# Patient Record
Sex: Female | Born: 1985 | Race: Black or African American | Hispanic: No | Marital: Single | Smoking: Current every day smoker
Health system: Southern US, Community
[De-identification: ages and names within clinical notes are randomized; demographics above are authoritative.]

## PROBLEM LIST (undated history)

## (undated) DIAGNOSIS — N946 Dysmenorrhea, unspecified: Principal | ICD-10-CM

## (undated) DIAGNOSIS — F419 Anxiety disorder, unspecified: Secondary | ICD-10-CM

## (undated) DIAGNOSIS — I10 Essential (primary) hypertension: Secondary | ICD-10-CM

## (undated) DIAGNOSIS — Q513 Bicornate uterus: Secondary | ICD-10-CM

## (undated) DIAGNOSIS — B379 Candidiasis, unspecified: Secondary | ICD-10-CM

## (undated) DIAGNOSIS — B9689 Other specified bacterial agents as the cause of diseases classified elsewhere: Secondary | ICD-10-CM

## (undated) DIAGNOSIS — F329 Major depressive disorder, single episode, unspecified: Secondary | ICD-10-CM

## (undated) DIAGNOSIS — N898 Other specified noninflammatory disorders of vagina: Principal | ICD-10-CM

## (undated) DIAGNOSIS — E669 Obesity, unspecified: Secondary | ICD-10-CM

## (undated) DIAGNOSIS — N926 Irregular menstruation, unspecified: Secondary | ICD-10-CM

## (undated) DIAGNOSIS — M79604 Pain in right leg: Secondary | ICD-10-CM

## (undated) DIAGNOSIS — K011 Impacted teeth: Secondary | ICD-10-CM

## (undated) DIAGNOSIS — F32A Depression, unspecified: Secondary | ICD-10-CM

## (undated) DIAGNOSIS — N390 Urinary tract infection, site not specified: Principal | ICD-10-CM

## (undated) DIAGNOSIS — N760 Acute vaginitis: Secondary | ICD-10-CM

## (undated) DIAGNOSIS — E119 Type 2 diabetes mellitus without complications: Secondary | ICD-10-CM

## (undated) DIAGNOSIS — K219 Gastro-esophageal reflux disease without esophagitis: Secondary | ICD-10-CM

## (undated) HISTORY — DX: Obesity, unspecified: E66.9

## (undated) HISTORY — DX: Irregular menstruation, unspecified: N92.6

## (undated) HISTORY — DX: Bicornate uterus: Q51.3

## (undated) HISTORY — DX: Other specified bacterial agents as the cause of diseases classified elsewhere: B96.89

## (undated) HISTORY — DX: Urinary tract infection, site not specified: N39.0

## (undated) HISTORY — DX: Other specified noninflammatory disorders of vagina: N89.8

## (undated) HISTORY — PX: NO PAST SURGERIES: SHX2092

## (undated) HISTORY — DX: Acute vaginitis: N76.0

## (undated) HISTORY — DX: Dysmenorrhea, unspecified: N94.6

## (undated) HISTORY — DX: Type 2 diabetes mellitus without complications: E11.9

## (undated) HISTORY — DX: Candidiasis, unspecified: B37.9

---

## 2008-05-29 ENCOUNTER — Encounter: Payer: Self-pay | Admitting: Family Medicine

## 2008-05-29 ENCOUNTER — Ambulatory Visit: Payer: Self-pay | Admitting: Family Medicine

## 2008-05-29 DIAGNOSIS — F17200 Nicotine dependence, unspecified, uncomplicated: Secondary | ICD-10-CM

## 2008-07-06 ENCOUNTER — Other Ambulatory Visit: Admission: RE | Admit: 2008-07-06 | Discharge: 2008-07-06 | Payer: Self-pay | Admitting: Family Medicine

## 2008-07-06 ENCOUNTER — Ambulatory Visit: Payer: Self-pay | Admitting: Family Medicine

## 2008-07-06 ENCOUNTER — Encounter: Payer: Self-pay | Admitting: Family Medicine

## 2008-07-06 DIAGNOSIS — R5383 Other fatigue: Secondary | ICD-10-CM

## 2008-07-06 DIAGNOSIS — R5381 Other malaise: Secondary | ICD-10-CM

## 2009-01-06 ENCOUNTER — Emergency Department (HOSPITAL_COMMUNITY): Admission: EM | Admit: 2009-01-06 | Discharge: 2009-01-06 | Payer: Self-pay | Admitting: Emergency Medicine

## 2009-09-16 ENCOUNTER — Ambulatory Visit: Payer: Self-pay | Admitting: Family Medicine

## 2009-09-16 ENCOUNTER — Encounter: Payer: Self-pay | Admitting: Physician Assistant

## 2009-09-16 DIAGNOSIS — E669 Obesity, unspecified: Secondary | ICD-10-CM | POA: Insufficient documentation

## 2009-09-16 DIAGNOSIS — K219 Gastro-esophageal reflux disease without esophagitis: Secondary | ICD-10-CM

## 2009-09-16 DIAGNOSIS — R110 Nausea: Secondary | ICD-10-CM | POA: Insufficient documentation

## 2009-09-16 LAB — CONVERTED CEMR LAB: Beta hcg, urine, semiquantitative: NEGATIVE

## 2009-09-30 ENCOUNTER — Telehealth: Payer: Self-pay | Admitting: Family Medicine

## 2009-10-01 ENCOUNTER — Ambulatory Visit: Payer: Self-pay | Admitting: Family Medicine

## 2009-10-01 DIAGNOSIS — R109 Unspecified abdominal pain: Secondary | ICD-10-CM

## 2009-10-01 LAB — CONVERTED CEMR LAB
Bilirubin Urine: NEGATIVE
Specific Gravity, Urine: <1.005
Urobilinogen, UA: 0.2
pH: 6

## 2009-10-04 ENCOUNTER — Encounter: Payer: Self-pay | Admitting: Physician Assistant

## 2009-10-04 LAB — CONVERTED CEMR LAB
Chlamydia, DNA Probe: NEGATIVE
GC Probe Amp, Genital: NEGATIVE

## 2009-11-02 ENCOUNTER — Other Ambulatory Visit
Admission: RE | Admit: 2009-11-02 | Discharge: 2009-11-02 | Payer: Self-pay | Source: Home / Self Care | Admitting: Family Medicine

## 2009-11-02 ENCOUNTER — Ambulatory Visit: Payer: Self-pay | Admitting: Family Medicine

## 2009-11-02 DIAGNOSIS — R1013 Epigastric pain: Secondary | ICD-10-CM

## 2009-11-02 DIAGNOSIS — K3189 Other diseases of stomach and duodenum: Secondary | ICD-10-CM

## 2009-11-02 DIAGNOSIS — N3000 Acute cystitis without hematuria: Secondary | ICD-10-CM | POA: Insufficient documentation

## 2009-11-02 LAB — CONVERTED CEMR LAB
Beta hcg, urine, semiquantitative: NEGATIVE
Bilirubin Urine: NEGATIVE
Glucose, Urine, Semiquant: NEGATIVE
Ketones, urine, test strip: NEGATIVE
Nitrite: NEGATIVE
Protein, U semiquant: NEGATIVE
Specific Gravity, Urine: 1.01
Urobilinogen, UA: 0.2
WBC Urine, dipstick: NEGATIVE
pH: 6

## 2009-11-04 ENCOUNTER — Telehealth: Payer: Self-pay | Admitting: Family Medicine

## 2009-11-05 LAB — CONVERTED CEMR LAB
Calcium: 9.4 mg/dL (ref 8.4–10.5)
Glucose, Bld: 87 mg/dL (ref 70–99)
Sodium: 139 meq/L (ref 135–145)
TSH: 1.501 microintl units/mL (ref 0.350–4.500)

## 2009-11-08 ENCOUNTER — Telehealth: Payer: Self-pay | Admitting: Family Medicine

## 2009-11-11 ENCOUNTER — Ambulatory Visit (HOSPITAL_COMMUNITY): Admission: RE | Admit: 2009-11-11 | Discharge: 2009-11-11 | Payer: Self-pay | Admitting: Family Medicine

## 2009-11-11 ENCOUNTER — Encounter: Payer: Self-pay | Admitting: Family Medicine

## 2009-11-15 ENCOUNTER — Encounter (HOSPITAL_COMMUNITY): Admission: RE | Admit: 2009-11-15 | Discharge: 2009-12-15 | Payer: Self-pay | Admitting: Family Medicine

## 2009-11-24 ENCOUNTER — Ambulatory Visit: Payer: Self-pay | Admitting: Family Medicine

## 2009-11-24 ENCOUNTER — Emergency Department (HOSPITAL_COMMUNITY): Admission: EM | Admit: 2009-11-24 | Discharge: 2009-11-24 | Payer: Self-pay | Admitting: Emergency Medicine

## 2009-11-24 DIAGNOSIS — R109 Unspecified abdominal pain: Secondary | ICD-10-CM | POA: Insufficient documentation

## 2009-11-25 ENCOUNTER — Encounter: Payer: Self-pay | Admitting: Family Medicine

## 2009-11-25 ENCOUNTER — Telehealth: Payer: Self-pay | Admitting: Family Medicine

## 2009-11-25 LAB — CONVERTED CEMR LAB: hCG, Beta Chain, Quant, S: 0.4 milliintl units/mL

## 2009-11-26 ENCOUNTER — Inpatient Hospital Stay (HOSPITAL_COMMUNITY): Admission: AD | Admit: 2009-11-26 | Discharge: 2009-11-26 | Payer: Self-pay | Admitting: Obstetrics & Gynecology

## 2009-11-26 ENCOUNTER — Telehealth: Payer: Self-pay | Admitting: Family Medicine

## 2009-11-26 LAB — CONVERTED CEMR LAB
Alkaline Phosphatase: 69 units/L (ref 39–117)
Basophils Absolute: 0 10*3/uL (ref 0.0–0.1)
Basophils Relative: 0 % (ref 0–1)
Bilirubin, Direct: 0.1 mg/dL (ref 0.0–0.3)
Eosinophils Absolute: 0.4 10*3/uL (ref 0.0–0.7)
Eosinophils Relative: 6 % — ABNORMAL HIGH (ref 0–5)
HCT: 39.8 % (ref 36.0–46.0)
Indirect Bilirubin: 0.2 mg/dL (ref 0.0–0.9)
LDL Cholesterol: 75 mg/dL (ref 0–99)
Lymphocytes Relative: 37 % (ref 12–46)
MCV: 94.1 fL (ref 78.0–100.0)
Platelets: 202 10*3/uL (ref 150–400)
RDW: 13.4 % (ref 11.5–15.5)
Total Bilirubin: 0.3 mg/dL (ref 0.3–1.2)
Vit D, 25-Hydroxy: <10 ng/mL — ABNORMAL LOW (ref 30–89)

## 2009-11-30 ENCOUNTER — Ambulatory Visit: Payer: Self-pay | Admitting: Internal Medicine

## 2009-11-30 DIAGNOSIS — R112 Nausea with vomiting, unspecified: Secondary | ICD-10-CM

## 2009-12-02 ENCOUNTER — Telehealth: Payer: Self-pay | Admitting: Gastroenterology

## 2010-04-11 ENCOUNTER — Emergency Department (HOSPITAL_COMMUNITY): Admission: EM | Admit: 2010-04-11 | Discharge: 2010-04-11 | Payer: Self-pay | Admitting: Emergency Medicine

## 2010-06-28 ENCOUNTER — Encounter: Payer: Self-pay | Admitting: Family Medicine

## 2010-08-14 ENCOUNTER — Encounter: Payer: Self-pay | Admitting: Family Medicine

## 2010-08-23 NOTE — Assessment & Plan Note (Signed)
Summary: abd pain/ss   Visit Type:  consult Referring Provider:  Syliva Overman Primary Care Provider:  Syliva Overman  Chief Complaint:  abd pain and n/v.  History of Present Illness: Pamela Ford is here for further evaluation of abd pain, n/v at request of Dr. Syliva Overman. Patient c/o 4 month h/o abd pain. She first says the pain is all over, but then points to lower abdomen as location of her pain. She c/o increased abd pain one week before and during her menstrual cycle. She is currently having her cycle and c/o heavy bleeding. Cycles occur once per month. She c/o lower abd cramping during these two weeks. She also has associated n/v at that time as well. The remaining time she generally feels well but will occasionally have vomiting and lower abd pain. Denies vaginal d/c or urinary symptoms. Denies heartburn, constipation, diarrhea, melena, brbpr. She admits to a lot of stress and her boyfriend says it seems to make her symptoms start. Denies weight loss.   She has had HIDA and Abd U/S which were unremarkable. Labs including, CBC, CMET, TSH, amylase, lipase, H. Pylori were unremarkable. She was found to have low vit D level. GC/chlamydia probes negative. Pap abnormal and need gyn referral but not done yet.  Current Medications (verified): 1)  Vitamin D (Ergocalciferol) 50000 Unit Caps (Ergocalciferol) .... One Tab Once A Week 2)  Promethazine Hcl 25 Mg Tabs (Promethazine Hcl) .... As Needed  Allergies (verified): No Known Drug Allergies  Past History:  Past Medical History: Reviewed history from 11/02/2009 and no changes required. obesity 4 month h/o vomtting and abdominal pain since Jan 2011  Past Surgical History: Reviewed history from 05/29/2008 and no changes required. none  Family History: mom healthy aged 57 dad   57, htn , dm brothers   health unkown ages 49 and 48 No FH of CRC, colon polyps, Crohns disease.  Social History: Single Occupation:  unemployed x 3  months Current Smoker  yes since 2008, 6 cigs Alcohol use-no Drug use-no  Review of Systems General:  Denies fever, chills, sweats, anorexia, fatigue, weakness, and weight loss. Eyes:  Denies vision loss. ENT:  Denies nasal congestion, sore throat, hoarseness, and difficulty swallowing. CV:  Denies chest pains, angina, palpitations, dyspnea on exertion, and peripheral edema. Resp:  Denies dyspnea at rest, dyspnea with exercise, cough, and wheezing. GI:  See HPI. GU:  Denies urinary burning, blood in urine, abnormal vaginal bleeding, and vaginal discharge. MS:  Denies joint pain / LOM. Derm:  Denies rash and itching. Neuro:  Denies weakness, frequent headaches, memory loss, and confusion. Psych:  Denies depression and anxiety. Endo:  Denies unusual weight change. Heme:  Denies bruising and bleeding. Allergy:  Denies hives and rash.  Vital Signs:  Patient profile:   25 year old female Menstrual status:  regular Height:      64 inches Weight:      208 pounds BMI:     35.83 Temp:     97.4 degrees F oral Pulse rate:   80 / minute BP sitting:   132 / 80  (left arm) Cuff size:   regular  Vitals Entered By: Hendricks Limes LPN (Nov 30, 2009 9:22 AM)  Physical Exam  General:  Well developed, well nourished, no acute distress.obese.   Head:  Normocephalic and atraumatic. Eyes:  Conjunctivae pink, no scleral icterus.  Mouth:  Oropharyngeal mucosa moist, pink.  No lesions, erythema or exudate.    Neck:  Supple; no masses or thyromegaly.  Lungs:  Clear throughout to auscultation. Heart:  Regular rate and rhythm; no murmurs, rubs,  or bruits. Abdomen:  Bowel sounds normal.  Abdomen is soft, nontender, nondistended.  No rebound or guarding.  No hepatosplenomegaly, masses or hernias.  No abdominal bruits.  Extremities:  No clubbing, cyanosis, edema or deformities noted. Neurologic:  Alert and  oriented x4;  grossly normal neurologically. Skin:  Intact without significant lesions or  rashes. Cervical Nodes:  No significant cervical adenopathy. Psych:  Alert and cooperative. Normal mood and affect.  Impression & Recommendations:  Problem # 1:  ABDOMINAL PAIN (ICD-789.00)  Lower abdominal pain, described as crampy, which precedes and is during menstrual cycle. She denies any association with bowel function. She has daily, normal BMs. ?gyn relation, ?endometriosis. She has h/o abnormal pap and patient states she has not been to gyn about this. Recommend gyn referral.  Orders: Consultation Level III ([address])  Problem # 2:  NAUSEA WITH VOMITING (ICD-787.01)  Seems to be mostly related to week before and of menstrual cycles. Some n/v at other times but not as much. Denies typical heartburn. ?dyspepsia or atypical reflux. ?hormone related gastroparesis. Initially I have asked that she keep diary of her symptoms. She will start Nexium 40mg  by mouth daily. I think current w/u is reassuring. She will f/u with Dr. Jena Gauss in 3-4 weeks. At that time, will reassess symptoms and decide about need for CT A/P or EGD.   Orders: Consultation Level III (60[address])  Patient Instructions: 1)  Please start Nexium 40mg  one capsule 30 mins before breakfast. 2)  Please keep record of your symptoms. Each day, write down if you have abdominal pain, vomiting, or a bowel movement.  3)  Come back to see [place] in four weeks to decide if further work-up needed. 4)  The medication list was reviewed and reconciled.  All changed / newly prescribed medications were explained.  A complete medication list was provided to the patient / caregiver. I would like to thank Dr. Lodema Hong for allowing [place] to take part in the care of this nice patient.

## 2010-08-23 NOTE — Progress Notes (Signed)
Summary: lab results   Phone Note Call from Patient   Summary of Call: pt would like to get results of lab work [phone] Initial call taken by: Rudene Anda,  Nov 25, 2009 2:05 PM  Follow-up for Phone Call        advised patient negative Follow-up by: Adella Hare LPN,  Nov 25, 1476 2:56 PM

## 2010-08-23 NOTE — Assessment & Plan Note (Signed)
Summary: per dr. Lodema Hong   Vital Signs:  Patient profile:   25 year old female Menstrual status:  regular LMP:     11/03/2009 Height:      64 inches Weight:      209.25 pounds BMI:     36.05 O2 Sat:      95 % on Room air Pulse rate:   94 / minute Pulse rhythm:   regular Resp:     16 per minute BP sitting:   126 / 80  (left arm) Cuff size:   large  Vitals Entered By: Everitt Amber LPN (Nov 25, 8839 4:10 PM)  Nutrition Counseling: [name] BMI is greater than 25 and therefore counseled on weight management options.  O2 Flow:  Room air CC: has been vomiting and having abdominal cramps for 3-4 months now. Pain Assessment Patient in pain? 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  History of Present Illness: c/o approx 3 to 4 monthh/olower abdominal apin as wellas nausea ansd vomitting. Pt was actually at an urgent care facility earlier today stating she had this complaint and that no one was l;istening. Sheis now being re-evaluated here. Since she was seen iin the office in the past month she had gall bladder eval which is negative, also has seen gynae for an abnormal pap smear.She had a neg urine test for infection and denies UtI sym[ptoms. She is accompanied by her partener who states that she experienced alot of abdominal pain during intercourse the previous bnight which nearly necessitated an EDvisit. Her lmp was abn, she has a neg urine preg test. she denies fever or chills.  She has an appt with gI scheduled forn  next week.  Current Medications (verified): 1)  Tri-Sprintec 0.18/0.215/0.25 Mg-35 Mcg Tabs (Norgestim-Eth Estrad Triphasic) .... Use As Directed  Allergies (verified): No Known Drug  Allergies  Review of Systems      See HPI General:  Complains of fatigue; denies chills and fever. ENT:  Denies hoarseness, nasal congestion, sinus pressure, and sore throat. CV:  Denies chest pain or discomfort, palpitations, and swelling of feet. Resp:  Denies cough and sputum productive. GI:  Complains of abdominal pain, nausea, and vomiting; denies change in bowel habits, constipation, and diarrhea. GU:  Denies dysuria and urinary frequency. Derm:  Denies itching and rash. Neuro:  Denies headaches and seizures. Psych:  Denies anxiety and depression. Endo:  Denies excessive thirst and excessive urination. Heme:  Denies abnormal bruising and bleeding. Allergy:  Denies hives or rash and itching eyes.  Physical Exam  General:  Well-developed,obese,in no acute distress; alert,appropriate and cooperative throughout examination HEENT: No facial asymmetry,  EOMI, No sinus tenderness, TM's Clear, oropharynx  pink and moist.   Chest: Clear to auscultation bilaterally.  CVS: S1, S2, No murmurs, No S3.   Abd: Soft, generalised tenderness, possible rebound in RLQ, questionable RLQ mass MS: Adequate ROM spine, hips, shoulders and knees.  Ext: No edema.   CNS: CN 2-12 intact, power tone and sensation normal throughout.   Skin: Intact, no visible lesions or rashes.  Psych: Good eye contact, normal affect.  Memory intact, not anxious or depressed appearing.    Impression & Recommendations:  Problem #  1:  ABDOMINAL PAIN (ICD-789.00) Assessment Deteriorated  Orders: T-Hepatic Function [phone]) Radiology Referral (Radiology)  Problem # 2:  DYSPEPSIA (ICD-536.8) Assessment: Deteriorated  Orders: T-Lipase ([id number]) T-Amylase ([id number])  Problem # 3:  NAUSEA (ICD-787.02) Assessment: Unchanged  Orders: T-Pregnancy (Serum), Quant. [phone])  Discussed symptom control.   Problem # 4:  OBESITY (ICD-278.00) Assessment: Unchanged  Ht: 64 (11/24/2009)   Wt: 209.25  (11/24/2009)   BMI: 36.05 (11/24/2009)  Problem # 5:  NICOTINE ADDICTION (ICD-305.1) Assessment: Unchanged  Encouraged smoking cessation and discussed different methods for smoking cessation.   Complete Medication List: 1)  Tri-sprintec 0.18/0.215/0.25 Mg-35 Mcg Tabs (Norgestim-eth estrad triphasic) .... Use as directed  Other Orders: T-CBC w/Diff [phone]) T-Lipid Profile [phone]) T-Vitamin D (25-Hydroxy) [phone])  Patient Instructions: 1)  Please schedule a follow-up appointment in 1 month. 2)  You are being referred for scans of your abdomen and pelvis to see whybyopu arr hviang severe abdominal pain fop the past 4 months with nausea and vomitting. 3)  I will also speak with gynae about the fact that you are  having alot of pain with intercourse , as well as excessively   painful cycles in the past 4 months

## 2010-08-23 NOTE — Progress Notes (Signed)
Summary: Cancelled CT  Phone Note Call from Patient   Summary of Call: pt going to cancel Ct scan today. Says she is going to go to [place]. I told pt that I would let the Doc know that she was cancelling appt. but never gave me a reason why. I do know that this pt doesn't have insurance! And told her to speak with Lubertha Basque about payment plans and also getting discount. But pt never did. Just wanted you to know she cancelled the CT.  Initial call taken by: Rudene Anda,  Nov 26, 2009 8:40 AM  Follow-up for Phone Call        noted Follow-up by: Syliva Overman MD,  Nov 26, 2009 11:16 AM

## 2010-08-23 NOTE — Progress Notes (Signed)
Summary: results   Phone Note Call from Patient   Summary of Call: would like to get results of lab work. [phone] Initial call taken by: Rudene Anda,  November 08, 2009 9:01 AM  Follow-up for Phone Call        returned call, left message Follow-up by: Adella Hare LPN,  November 08, 2009 9:12 AM  Additional Follow-up for Phone Call Additional follow up Details #1::        patient aware labs normal and also to return to lab to have the additional test they didnt do  patient is also aware of radiology appt  Additional Follow-up by: Adella Hare LPN,  November 08, 2009 9:17 AM

## 2010-08-23 NOTE — Letter (Signed)
Summary: [facility] referral for [facility] Scan  [facility] referral for [facility]   Imported By: Rudene Anda 11/11/2009 13:20:57  _____________________________________________________________________  External Attachment:    Type:   Image     Comment:   External Document

## 2010-08-23 NOTE — Assessment & Plan Note (Signed)
Summary: lower abdomen pain- room 2   Vital Signs:  Patient profile:   25 year old female Menstrual status:  regular Height:      64 inches Weight:      213.50 pounds BMI:     36.78 O2 Sat:      97 % on Room air Temp:     98.7 degrees F oral Pulse rate:   80 / minute Resp:     16 per minute BP sitting:   120 / 80  (left arm)  Vitals Entered By: Adella Hare LPN (October 01, 2009 10:06 AM)  Nutrition Counseling: [name] BMI is greater than 25 and therefore counseled on weight management options. CC: pain in lower abdomen Is Patient Diabetic? No Pain Assessment Patient in pain? yes     Location: abdomen Intensity: 5 Type: aching Onset of pain  Intermittent   CC:  pain in lower abdomen.  History of Present Illness: Pt is here today with c/o lower abd pains & cramping.  States she has pains x couple days before she starts her period. Also  has pain btwn menses that comes & goes, but "is more often than not".  She denies urinary frequency but states it does sometimes have some pain/discomfort with urination.  Pt is vague to how long this is going on,"long time".  She denies dysparunia.  BM's are nl.  No fever or chills.  Pt was seen a few wks ago due to GERD syptoms.  She states she is not having any heartburn now.  She did not pick up her prescription.  She said she is unable to afford it but didn't check at the pharmacy to see how much the cost is.    Pt has appt 11-02-09 for her annual physical/pap.  Allergies: No Known Drug Allergies  Past History:  Past medical history reviewed for relevance to current acute and chronic problems.  Past Medical History: Reviewed history from 05/29/2008 and no changes required. obesity  Review of Systems General:  Denies chills and fever. GI:  Complains of abdominal pain; denies bloody stools, change in bowel habits, constipation, dark tarry stools, diarrhea, indigestion, nausea, and vomiting. GU:  Denies abnormal vaginal bleeding,  urinary frequency, and urinary hesitancy.  Physical Exam  General:  Well-developed,well-nourished,in no acute distress; alert,appropriate and cooperative throughout examination Head:  Normocephalic and atraumatic without obvious abnormalities. No apparent alopecia or balding. Neck:  No deformities, masses, or tenderness noted. Lungs:  Normal respiratory effort, chest expands symmetrically. Lungs are clear to auscultation, no crackles or wheezes. Heart:  Normal rate and regular rhythm. S1 and S2 normal without gallop, murmur, click, rub or other extra sounds. Abdomen:  Nl BS x 4.  Pt reports tenderness with palp suprapubic.  No guarding rebound, or rigidity. Genitalia:  Normal introitus for age, no external lesions, no vaginal discharge, mucosa pink and moist, no vaginal or cervical lesions, no vaginal atrophy, no friaility or hemorrhage, normal uterus size and position. Neg CMT.  Mild tenderness with palp Lt adnexa > Rt without mass.  Cervical Nodes:  No lymphadenopathy noted Psych:  Cognition and judgment appear intact. Alert and cooperative with normal attention span and concentration. No apparent delusions, illusions, hallucinations   Impression & Recommendations:  Problem # 1:  PELVIC  PAIN (ICD-789.09) Assessment New  Orders: Radiology Referral (Radiology) T-Chlamydia & GC Probe, Genital ([id number])  Her updated medication list for this problem includes:    Naprosyn 500 Mg Tabs (Naproxen) .Marland Kitchen... Take 1 two times a  day with food as needed pelvic pain  Problem # 2:  GERD (ICD-530.81) Assessment: Improved Pt is not taking her Omeprazole.  States syptoms have improved.  Her updated medication list for this problem includes:    Omeprazole 20 Mg Cpdr (Omeprazole) .Marland Kitchen... 1 daily  Problem # 3:  OBESITY (ICD-278.00) Assessment: Improved Congratulated pt on her 2# wt loss. Recommend she continue with her walking for exercise & improved diet.  Complete Medication List: 1)   Omeprazole 20 Mg Cpdr (Omeprazole) .Marland Kitchen.. 1 daily 2)  Naprosyn 500 Mg Tabs (Naproxen) .... Take 1 two times a day with food as needed pelvic pain  Other Orders: UA Dipstick W/ Micro (manual) ([address])  Patient Instructions: 1)  Keep  your appt next month. 2)  I have ordered a pelvic ultrasound for you. 3)  I have prescribed Naprosyn for pain to the pharmacy.  Do not take any Aleve or Ibuprofen with this. 4)  It is important that you exercise regularly at least 20 minutes 5 times a week. If you develop chest pain, have severe difficulty breathing, or feel very tired , stop exercising immediately and seek medical attention. 5)  You need to lose weight. Consider a lower calorie diet and regular exercise.  Prescriptions: NAPROSYN 500 MG TABS (NAPROXEN) take 1 two times a day with food as needed pelvic pain  #30 x 0   Entered and Authorized by:   Esperanza Sheets PA   Signed by:   Esperanza Sheets PA on 10/01/2009   Method used:   Electronically to        [facility]  [address] (retail)       [address]       [place], [place]  [address]       Ph: [phone]       Fax: [phone]   RxID:   [id number]   Laboratory Results   Urine Tests  Date/Time Received: October 01, 2009  Date/Time Reported: October 01, 2009   Routine Urinalysis   Color: yellow Appearance: Clear Glucose: negative   (Normal Range: Negative) Bilirubin: negative   (Normal Range: Negative) Ketone: negative   (Normal Range: Negative) Spec. Gravity: <1.005   (Normal Range: 1.003-1.035) Blood: large   (Normal Range: Negative) pH: 6.0   (Normal Range: 5.0-8.0) Protein: negative   (Normal Range: Negative) Urobilinogen: 0.2   (Normal Range: 0-1) Nitrite: negative   (Normal Range: Negative) Leukocyte Esterace: negative   (Normal Range: Negative)

## 2010-08-23 NOTE — Letter (Signed)
Summary: 1st missed letter  1st missed letter   Imported By: Lind Guest 06/28/2010 17:05:52  _____________________________________________________________________  External Attachment:    Type:   Image     Comment:   External Document

## 2010-08-23 NOTE — Progress Notes (Signed)
Summary: results and wrong number  Phone Note Call from Patient   Summary of Call: pt gave wrong number for results. [phone]  please try her back Initial call taken by: Rudene Anda,  November 04, 2009 3:09 PM  Follow-up for Phone Call        this number is not a working number either Follow-up by: Adella Hare LPN,  November 04, 2009 4:37 PM

## 2010-08-23 NOTE — Progress Notes (Signed)
Summary: bladder infection  Phone Note Call from Patient   Summary of Call: pt wants to know how to treat for bladder infection. [phone] Initial call taken by: Rudene Anda,  September 30, 2009 11:16 AM  Follow-up for Phone Call        returned call, left message Follow-up by: Adella Hare LPN,  September 30, 2009 1:52 PM  Additional Follow-up for Phone Call Additional follow up Details #1::        complains of lower abdomen pressure, frequent urination Additional Follow-up by: Adella Hare LPN,  September 30, 2009 1:56 PM    Additional Follow-up for Phone Call Additional follow up Details #2::    patient coming in tomorrow to see dawn Follow-up by: Adella Hare LPN,  September 30, 2009 1:58 PM

## 2010-08-23 NOTE — Progress Notes (Signed)
Summary: gyn appt  ---- Converted from flag ---- ---- 12/02/2009 9:16 AM, Cloria Spring LPN wrote: Per Dr. Rayna Sexton office pt was seen there on 11/23/2009 and had a colpo????  ---- 11/30/2009 11:55 AM, Leanna Battles. Dixon Boos wrote: please make sure pt has made appt with dr. Rayna Sexton office for abnormal pap. was arranged by simpson, but pt did not seem to know about any of this today when I saw her ------------------------------  Good.

## 2010-08-23 NOTE — Assessment & Plan Note (Signed)
Summary: physical   Vital Signs:  Patient profile:   25 year old female Menstrual status:  regular LMP:     10/29/2009 Height:      64 inches Weight:      206.75 pounds BMI:     35.62 O2 Sat:      98 % Pulse rate:   77 / minute Pulse rhythm:   regular Resp:     16 per minute BP sitting:   120 / 80  (left arm)  Vitals Entered By: Everitt Amber LPN (November 02, 2009 2:27 PM)  Nutrition Counseling: [name] BMI is greater than 25 and therefore counseled on weight management options. CC: during her period she can't keep anything down, she vomits  Vision Screening:Left eye w/o correction: 20 / 20 Right Eye w/o correction: 20 / 20 Both eyes w/o correction:  20/ 20  Color vision testing: normal      Vision Entered By: Everitt Amber LPN (November 02, 2009 2:33 PM) LMP (date): 10/29/2009     Enter LMP: 10/29/2009 Last PAP Result NEGATIVE FOR INTRAEPITHELIAL LESIONS OR MALIGNANCY.   CC:  during her period she can't keep anything down and she vomits.  History of Present Illness: Reports  that she has not been doing very well. Her symptoms are confined to the GI system where she has been experiencinfg a significant amt of nausea, espescially at the time of her period, she also vomits this hass been going on for approx 4 months, she recently finished her cycle and is quite nauseated oday.Seh also reports fatigue, her father is diabetic, she has had no labwork and a urine test will be done to r/o pregnancy as wellas diabetes.She has not been on contraceptive meds for several months. Denies recent fever or chills. Denies sinus pressure, nasal congestion , ear pain or sore throat. Denies chest congestion, or cough productive of sputum. Denies chest pain, palpitations, PND, orthopnea or leg swelling. Denies  diarrhea or constipation. Denies change in bowel movements or bloody stool. Denies dysuria , frequency, incontinence or hesitancy. Denies  joint pain, swelling, or reduced  mobility. Denies headaches, vertigo, seizures. Denies depression, anxiety or insomnia. Denies  rash, lesions, or itch. She still smokes on avg 5 ciggs/day and is not highly motivated to quit at this time unfortunately.     Preventive Screening-Counseling & Management  Alcohol-Tobacco     Smoking Cessation Counseling: yes  Current Medications (verified): 1)  None  Allergies (verified): No Known Drug Allergies  Past History:  Past medical, surgical, family and social histories (including risk factors) reviewed for relevance to current acute and chronic problems.  Past Medical History: obesity 4 month h/o vomtting and abdominal pain since Jan 2011  Past Surgical History: Reviewed history from 05/29/2008 and no changes required. none  Family History: Reviewed history from 05/29/2008 and no changes required. mOm healthy aged 62 dad   39, htn , dm brothers   health unkown ages 54 and 17  Social History: Reviewed history from 05/29/2008 and no changes required. Single Occupation:  unemployed x 3 months Current Smoker  yes since 2008 Alcohol use-no Drug use-no  Review of Systems      See HPI General:  Complains of fatigue. GI:  Complains of dark tarry stools, excessive appetite, hemorrhoids, nausea, and vomiting; 4 month h/o nausea and vomitting intermittently, no diarreah, generalised abdominal pain, vomitting is mainly when she is on her cycle . Endo:  Complains of weight change. Heme:  Denies abnormal bruising and bleeding.  Allergy:  Denies hives or rash and itching eyes.  Physical Exam  General:  Well-developed,obese no acute distress; alert,appropriate and cooperative throughout examination Head:  Normocephalic and atraumatic without obvious abnormalities. No apparent alopecia or balding. Eyes:  No corneal or conjunctival inflammation noted. EOMI. Perrla. Funduscopic exam benign, without hemorrhages, exudates or papilledema. Vision grossly normal. Ears:   External ear exam shows no significant lesions or deformities.  Otoscopic examination reveals clear canals, tympanic membranes are intact bilaterally without bulging, retraction, inflammation or discharge. Hearing is grossly normal bilaterally. Nose:  External nasal examination shows no deformity or inflammation. Nasal mucosa are pink and moist without lesions or exudates. Mouth:  pharynx pink and moist and fair dentition.   Neck:  No deformities, masses, or tenderness noted. Chest Wall:  No deformities, masses, or tenderness noted. Breasts:  No mass, nodules, thickening, tenderness, bulging, retraction, inflamation, nipple discharge or skin changes noted.   Lungs:  Normal respiratory effort, chest expands symmetrically. Lungs are clear to auscultation, no crackles or wheezes. Heart:  Normal rate and regular rhythm. S1 and S2 normal without gallop, murmur, click, rub or other extra sounds. Abdomen:  obese, epigastric and RUQ tenderness. No guarding or rebound. no organomegaly or masses, bowel sounds present and normal Genitalia:  Normal introitus for age, no external lesions, no vaginal discharge, mucosa pink and moist, no vaginal or cervical lesions, no vaginal atrophy, no friaility or hemorrhage, normal uterus size and position, no adnexal masses or tenderness Msk:  No deformity or scoliosis noted of thoracic or lumbar spine.   Pulses:  R and L carotid,radial,femoral,dorsalis pedis and posterior tibial pulses are full and equal bilaterally Extremities:  No clubbing, cyanosis, edema, or deformity noted with normal full range of motion of all joints.   Neurologic:  No cranial nerve deficits noted. Station and gait are normal. Plantar reflexes are down-going bilaterally. DTRs are symmetrical throughout. Sensory, motor and coordinative functions appear intact. Skin:  Intact without suspicious lesions or rashes Cervical Nodes:  No lymphadenopathy noted Axillary Nodes:  No palpable  lymphadenopathy Inguinal Nodes:  No significant adenopathy Psych:  Cognition and judgment appear intact. Alert and cooperative with normal attention span and concentration. No apparent delusions, illusions, hallucinations   Impression & Recommendations:  Problem # 1:  SCREENING FOR MALIGNANT NEOPLASM OF THE CERVIX (ICD-V76.2) Assessment Comment Only  Orders: Pap Smear (93818)  Problem # 2:  ACUTE CYSTITIS (ICD-595.0) Assessment: Comment Only  Orders: UA Dipstick W/ Micro (manual) (81000),  negative, also no glucosuria, which is reassuring  Problem # 3:  PELVIC  PAIN (ICD-789.09) Assessment: Improved  The following medications were removed from the medication list:    Naprosyn 500 Mg Tabs (Naproxen) .Marland Kitchen... Take 1 two times a day with food as needed pelvic pain  Discussed use of medications, application of heat or cold, and exercises.   Problem # 4:  NAUSEA (ICD-787.02) Assessment: Deteriorated  Orders: Urine Pregnancy Test  (29937) negative Radiology Referral (Radiology)  Problem # 5:  NICOTINE ADDICTION (ICD-305.1) Assessment: Unchanged  Encouraged smoking cessation and discussed different methods for smoking cessation.   Complete Medication List: 1)  Tri-sprintec 0.18/0.215/0.25 Mg-35 Mcg Tabs (Norgestim-eth estrad triphasic) .... Use as directed 2)  Promethazine Hcl 25 Mg Tabs (Promethazine hcl) .... Take 1 tablet by mouth two times a day as needed  Other Orders: T-Basic Metabolic Panel [id number]) T-CBC w/Diff [id number]) T-TSH [id number]) TLB-H. Pylori Abs(Helicobacter Pylori) ([id number])  Patient Instructions: 1)  Please schedule a follow-up appointment in 2 months. 2)  Tobacco is very bad for your health and your loved ones! You Should stop smoking!. 3)  Stop Smoking Tips: Choose a Quit date. Cut down before the Quit date. decide what you will do as a substitute when you feel the urge to smoke(gum,toothpick,exercise). 4)  It is important that you  exercise regularly at least 20 minutes 5 times a week. If you develop chest pain, have severe difficulty breathing, or feel very tired , stop exercising immediately and seek medical attention. 5)  You need to lose weight. Consider a lower calorie diet and regular exercise.  6)  BMP prior to visit, ICD-9: 7)  TSH prior to visit, ICD-9:   labs today, abd pain and vomitting 8)  CBC w/ Diff prior to visit, ICD-9: 9)  H ptlori 10)  you will be referred for gall bladder studies, if no cause is found for your vomityting, tyou will be referred to a stomach special;ist. 11)  Meds are sent in for vomitting to use as needed, also your birth control pills  Prescriptions: PROMETHAZINE HCL 25 MG TABS (PROMETHAZINE HCL) Take 1 tablet by mouth two times a day as needed  #30 x 0   Entered and Authorized by:   Syliva Overman MD   Signed by:   Syliva Overman MD on 11/02/2009   Method used:   Electronically to        [facility]  [address] (retail)       [address]       [place], [place]  [address]       Ph: [phone]       Fax: [phone]   RxID:   [id number] TRI-SPRINTEC 0.18/0.215/0.25 MG-35 MCG TABS (NORGESTIM-ETH ESTRAD TRIPHASIC) Use as directed  #30 x 11   Entered and Authorized by:   Syliva Overman MD   Signed by:   Syliva Overman MD on 11/02/2009   Method used:   Electronically to        [facility]  [address] (retail)       [address]       [place], [place]  [address]       Ph: [phone]       Fax: [phone]   RxID:   [id number]    Laboratory Results   Urine Tests    Routine Urinalysis   Color: yellow Appearance: Clear Glucose: negative   (Normal Range: Negative) Bilirubin: negative   (Normal Range: Negative) Ketone: negative   (Normal Range: Negative) Spec. Gravity: 1.010   (Normal Range: 1.003-1.035) Blood: trace-lysed   (Normal Range: Negative) pH: 6.0   (Normal Range: 5.0-8.0) Protein: negative   (Normal Range:  Negative) Urobilinogen: 0.2   (Normal Range: 0-1) Nitrite: negative   (Normal Range: Negative) Leukocyte Esterace: negative   (Normal Range: Negative)    Urine HCG: negative

## 2010-08-23 NOTE — Progress Notes (Signed)
Summary: results  Phone Note Call from Patient   Summary of Call: pt would like to get results of lab work. [phone] Initial call taken by: Rudene Anda,  November 04, 2009 1:51 PM  Follow-up for Phone Call        this number is no longer in service Follow-up by: Adella Hare LPN,  November 04, 2009 3:00 PM

## 2010-08-23 NOTE — Assessment & Plan Note (Signed)
Summary: stomach pain- room 2   Vital Signs:  Patient profile:   25 year old female Menstrual status:  regular LMP:     09/08/2009 Height:      64 inches Weight:      215.94 pounds BMI:     37.20 O2 Sat:      100 % on Room air Pulse rate:   95 / minute Resp:     16 per minute BP sitting:   140 / 80  (left arm)  Vitals Entered By: Adella Hare LPN (September 16, 2009 1:29 PM)  Nutrition Counseling: [name] BMI is greater than 25 and therefore counseled on weight management options. CC: stomach pain, nausea Is Patient Diabetic? No Pain Assessment Patient in pain? no      LMP (date): 09/08/2009     Menstrual Status regular Enter LMP: 09/08/2009 Last PAP Result NEGATIVE FOR INTRAEPITHELIAL LESIONS OR MALIGNANCY.   CC:  stomach pain and nausea.  History of Present Illness: Nauseated off & on x 2-3 mos.  Sometimes feels like fluid & stuff trying to come up, but no vomiting.  Appetite is nl.  No worsening of syptoms at hs.  Sometimes seems worse in the am's. BM's are nl.  Pt wonders if this is due to "sinuses"  but denies any sinus pressure,  nasal congestion or post nasal drainage.   Menses are regular.  Not on birth control.  Pt states she's not certain she wants to get pregnant but her boyfriend wants her to.  Sometimes uses condoms.  Current Medications (verified): 1)  None  Allergies (verified): No Known Drug Allergies  Past History:  Past medical, surgical, family and social histories (including risk factors) reviewed for relevance to current acute and chronic problems.  Past Medical History: Reviewed history from 05/29/2008 and no changes required. obesity  Past Surgical History: Reviewed history from 05/29/2008 and no changes required. none  Family History: Reviewed history from 05/29/2008 and no changes required. mOm healthy aged 64 dad   67, htn , dm brothers   health unkown ages 5 and 80  Social History: Reviewed history from 05/29/2008 and no  changes required. Single Occupation:  CNA class Current Smoker  yes Alcohol use-no Drug use-no  Review of Systems General:  Denies chills and fever. ENT:  Denies earache, nasal congestion, postnasal drainage, sinus pressure, and sore throat. CV:  Denies chest pain or discomfort and palpitations. Resp:  Denies cough and shortness of breath. GI:  Complains of indigestion and nausea; denies abdominal pain, bloody stools, change in bowel habits, constipation, dark tarry stools, diarrhea, and vomiting. GU:  Denies dysuria and urinary frequency. Heme:  Denies enlarge lymph nodes and fevers.  Physical Exam  General:  Well-developed,well-nourished,in no acute distress; alert,appropriate and cooperative throughout examination Head:  Normocephalic and atraumatic without obvious abnormalities. No apparent alopecia or balding. Ears:  External ear exam shows no significant lesions or deformities.  Otoscopic examination reveals clear canals, tympanic membranes are intact bilaterally without bulging, retraction, inflammation or discharge. Hearing is grossly normal bilaterally. Nose:  External nasal examination shows no deformity or inflammation. Nasal mucosa are pink and moist without lesions or exudates. Mouth:  Oral mucosa and oropharynx without lesions or exudates.  Teeth in good repair. Neck:  No deformities, masses, or tenderness noted. Lungs:  Normal respiratory effort, chest expands symmetrically. Lungs are clear to auscultation, no crackles or wheezes. Heart:  Normal rate and regular rhythm. S1 and S2 normal without gallop, murmur, click, rub or other  extra sounds. Abdomen:  Bowel sounds positive,abdomen soft and non-tender without masses, organomegaly or hernias noted. Cervical Nodes:  No lymphadenopathy noted Psych:  Cognition and judgment appear intact. Alert and cooperative with normal attention span and concentration. No apparent delusions, illusions, hallucinations   Impression &  Recommendations:  Problem # 1:  GERD (ICD-530.81) Assessment New  Her updated medication list for this problem includes:    Omeprazole 20 Mg Cpdr (Omeprazole) .Marland Kitchen... 1 daily  Problem # 2:  NAUSEA (ICD-787.02) Assessment: New  Probably due to #1 above.  Orders: Urine Pregnancy Test  (60454)  Problem # 3:  OBESITY (ICD-278.00)  Ht: 64 (09/16/2009)   Wt: 215.94 (09/16/2009)   BMI: 37.20 (09/16/2009)  Discussed the effects of obesity on GERD.  Encouraged healthy eating habits, exercise & wt loss.   Problem # 4:  NICOTINE ADDICTION (ICD-305.1)  Encouraged smoking cessation.   Complete Medication List: 1)  Omeprazole 20 Mg Cpdr (Omeprazole) .Marland Kitchen.. 1 daily  Patient Instructions: 1)  Please schedule a follow-up appointment in 1 month.  Pap/pelvic exam. 2)  Tobacco is very bad for your health and your loved ones! You Should stop smoking!. 3)  It is important that you exercise regularly at least 20 minutes 5 times a week. If you develop chest pain, have severe difficulty breathing, or feel very tired , stop exercising immediately and seek medical attention. 4)  You need to lose weight. Consider a lower calorie diet and regular exercise.  5)  Start Omeprazole once daily.  If there is no improvement in 2-4 weeks let us know.  Prescriptions: OMEPRAZOLE 20 MG CPDR (OMEPRAZOLE) 1 daily  #30 x 2   Entered and Authorized by:   Esperanza Sheets PA   Signed by:   Esperanza Sheets PA on 09/16/2009   Method used:   Electronically to        [facility]  [address] (retail)       [address]       [place], [place]  [address]       Ph: [phone]       Fax: [phone]   RxID:   [id number]   Laboratory Results   Urine Tests  Date/Time Received: Sep 16 2009  Date/Time Reported: Sep 16 2009    Urine HCG: negative

## 2010-10-06 LAB — POCT URINALYSIS DIPSTICK
Glucose, UA: NEGATIVE mg/dL
Ketones, ur: NEGATIVE mg/dL
Specific Gravity, Urine: 1.01 (ref 1.005–1.030)

## 2010-10-06 LAB — GC/CHLAMYDIA PROBE AMP, GENITAL: GC Probe Amp, Genital: NEGATIVE

## 2010-10-06 LAB — POCT PREGNANCY, URINE: Preg Test, Ur: NEGATIVE

## 2010-10-11 LAB — POCT URINALYSIS DIP (DEVICE)
Bilirubin Urine: NEGATIVE
Hgb urine dipstick: NEGATIVE
Ketones, ur: NEGATIVE mg/dL
Specific Gravity, Urine: <=1.005 (ref 1.005–1.030)
pH: 7 (ref 5.0–8.0)

## 2010-10-11 LAB — URINALYSIS, ROUTINE W REFLEX MICROSCOPIC
Bilirubin Urine: NEGATIVE
Hgb urine dipstick: NEGATIVE
Specific Gravity, Urine: <1.005 — ABNORMAL LOW (ref 1.005–1.030)
pH: 6.5 (ref 5.0–8.0)

## 2010-10-31 LAB — URINALYSIS, ROUTINE W REFLEX MICROSCOPIC
Ketones, ur: NEGATIVE mg/dL
Nitrite: NEGATIVE
Specific Gravity, Urine: <1.005 — ABNORMAL LOW (ref 1.005–1.030)
Urobilinogen, UA: 0.2 mg/dL (ref 0.0–1.0)
pH: 6 (ref 5.0–8.0)

## 2010-10-31 LAB — PREGNANCY, URINE: Preg Test, Ur: NEGATIVE

## 2011-02-06 ENCOUNTER — Telehealth: Payer: Self-pay | Admitting: Family Medicine

## 2011-02-06 NOTE — Telephone Encounter (Signed)
Patient advised 10/2009, requested something be sent to health dept, advised to have them send a signed release form stating what info they are needing

## 2011-05-03 ENCOUNTER — Telehealth: Payer: Self-pay | Admitting: Family Medicine

## 2011-05-04 NOTE — Telephone Encounter (Signed)
Patient has called back again today wants to be seen call back at [phone]
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
## 2011-05-04 NOTE — Telephone Encounter (Signed)
Patient aware.

## 2011-05-05 ENCOUNTER — Encounter: Payer: Self-pay | Admitting: *Deleted

## 2011-05-05 ENCOUNTER — Emergency Department (HOSPITAL_COMMUNITY)
Admission: EM | Admit: 2011-05-05 | Discharge: 2011-05-05 | Disposition: A | Discharge status: Home / Self Care | Payer: Self-pay | Attending: Emergency Medicine | Admitting: Emergency Medicine

## 2011-05-05 DIAGNOSIS — F17200 Nicotine dependence, unspecified, uncomplicated: Secondary | ICD-10-CM | POA: Insufficient documentation

## 2011-05-05 DIAGNOSIS — R599 Enlarged lymph nodes, unspecified: Secondary | ICD-10-CM | POA: Insufficient documentation

## 2011-05-05 DIAGNOSIS — L659 Nonscarring hair loss, unspecified: Secondary | ICD-10-CM | POA: Insufficient documentation

## 2011-05-05 DIAGNOSIS — H9209 Otalgia, unspecified ear: Secondary | ICD-10-CM | POA: Insufficient documentation

## 2011-05-05 MED ORDER — IBUPROFEN 600 MG PO TABS: 600.0000 mg | ORAL_TABLET | Freq: Four times a day (QID) | ORAL | Status: AC | PRN

## 2011-05-05 NOTE — ED Provider Notes (Deleted)
Medical screening examination/treatment/procedure(s) were conducted as a shared visit with non-physician practitioner(s) and myself.  I personally evaluated the patient during the encounter   Flint Melter, MD 05/05/11 1144  Flint Melter, MD 05/05/11 1145
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
## 2011-05-05 NOTE — ED Notes (Signed)
Family at bedside. Patient states she is okay at this time nothing is needed.

## 2011-05-25 NOTE — ED Provider Notes (Signed)
Medical screening examination/treatment/procedure(s) were performed by non-physician practitioner and as supervising physician I was immediately available for consultation/collaboration._  Flint Melter, MD 05/25/11 [phone]

## 2011-12-23 ENCOUNTER — Encounter (HOSPITAL_COMMUNITY): Payer: Self-pay | Admitting: Emergency Medicine

## 2011-12-23 ENCOUNTER — Emergency Department (HOSPITAL_COMMUNITY)
Admission: EM | Admit: 2011-12-23 | Discharge: 2011-12-23 | Disposition: A | Discharge status: Home / Self Care | Payer: Self-pay | Attending: Emergency Medicine | Admitting: Emergency Medicine

## 2011-12-23 DIAGNOSIS — F17200 Nicotine dependence, unspecified, uncomplicated: Secondary | ICD-10-CM | POA: Insufficient documentation

## 2011-12-23 DIAGNOSIS — N946 Dysmenorrhea, unspecified: Secondary | ICD-10-CM | POA: Insufficient documentation

## 2011-12-23 DIAGNOSIS — K219 Gastro-esophageal reflux disease without esophagitis: Secondary | ICD-10-CM | POA: Insufficient documentation

## 2011-12-23 MED ORDER — TRAMADOL HCL 50 MG PO TABS: 50.0000 mg | ORAL_TABLET | Freq: Four times a day (QID) | ORAL | Status: AC | PRN

## 2011-12-23 MED ORDER — FAMOTIDINE 20 MG PO TABS
20.0000 mg | ORAL_TABLET | Freq: Once | ORAL | Status: AC
  Administered 2011-12-23: 20 mg via ORAL
  Filled 2011-12-23: qty 1

## 2011-12-23 MED ORDER — FAMOTIDINE 20 MG PO TABS: 20.0000 mg | ORAL_TABLET | Freq: Two times a day (BID) | ORAL | Status: DC

## 2011-12-23 MED ORDER — TRAMADOL HCL 50 MG PO TABS
50.0000 mg | ORAL_TABLET | Freq: Once | ORAL | Status: AC
  Administered 2011-12-23: 50 mg via ORAL
  Filled 2011-12-23: qty 1

## 2011-12-23 MED ORDER — FAMOTIDINE IN NACL 20-0.9 MG/50ML-% IV SOLN
20.0000 mg | Freq: Once | INTRAVENOUS | Status: DC
  Filled 2011-12-23: qty 50

## 2011-12-23 NOTE — Discharge Instructions (Signed)
Diet for GERD or PUD Nutrition therapy can help ease the discomfort of gastroesophageal reflux disease (GERD) and peptic ulcer disease (PUD).  HOME CARE INSTRUCTIONS   Eat your meals slowly, in a relaxed setting.   Eat 5 to 6 small meals per day.   If a food causes distress, stop eating it for a period of time.  FOODS TO AVOID  Coffee, regular or decaffeinated.   Cola beverages, regular or low calorie.   Tea, regular or decaffeinated.   Pepper.   Cocoa.   High fat foods, including meats.   Butter, margarine, hydrogenated oil (trans fats).   Peppermint or spearmint (if you have GERD).   Fruits and vegetables if not tolerated.   Alcohol.   Nicotine (smoking or chewing). This is one of the most potent stimulants to acid production in the gastrointestinal tract.   Any food that seems to aggravate your condition.  If you have questions regarding your diet, ask your caregiver or a registered dietitian. TIPS  Lying flat may make symptoms worse. Keep the head of your bed raised 6 to 9 inches (15 to 23 cm) by using a foam wedge or blocks under the legs of the bed.   Do not lay down until 3 hours after eating a meal.   Daily physical activity may help reduce symptoms.  MAKE SURE YOU:   Understand these instructions.   Will watch your condition.   Will get help right away if you are not doing well or get worse.  Document Released: 07/10/2005 Document Revised: 06/29/2011 Document Reviewed: 05/26/2011 [facility] Patient Information 2012 [place], [place].Dysmenorrhea Menstrual pain is caused by the muscles of the uterus tightening (contracting) during a menstrual period. The muscles of the uterus contract due to the chemicals in the uterine lining. Primary dysmenorrhea is menstrual cramps that last a couple of days when you start having menstrual periods or soon after. This often begins after a teenager starts having her period. As a woman gets older or has a baby, the cramps will  usually lesson or disappear. Secondary dysmenorrhea begins later in life, lasts longer, and the pain may be stronger than primary dysmenorrhea. The pain may start before the period and last a few days after the period. This type of dysmenorrhea is usually caused by an underlying problem such as:  The tissue lining the uterus grows outside of the uterus in other areas of the body (endometriosis).   The endometrial tissue, which normally lines the uterus, is found in or grows into the muscular walls of the uterus (adenomyosis).   The pelvic blood vessels are engorged with blood just before the menstrual period (pelvic congestive syndrome).   Overgrowth of cells in the lining of the uterus or cervix (polyps of the uterus or cervix).   Falling down of the uterus (prolapse) because of loose or stretched ligaments.   Depression.   Bladder problems, infection, or inflammation.   Problems with the intestine, a tumor, or irritable bowel syndrome.   Cancer of the female organs or bladder.   A severely tipped uterus.   A very tight opening or closed cervix.   Noncancerous tumors of the uterus (fibroids).   Pelvic inflammatory disease (PID).   Pelvic scarring (adhesions) from a previous surgery.   Ovarian cyst.   An intrauterine device (IUD) used for birth control.  CAUSES  The cause of menstrual pain is often unknown. SYMPTOMS   Cramping or throbbing pain in your lower abdomen.   Sometimes, a woman  may also experience headaches.   Lower back pain.   Feeling sick to your stomach (nausea) or vomiting.   Diarrhea.   Sweating or dizziness.  DIAGNOSIS  A diagnosis is based on your history, symptoms, physical examination, diagnostic tests, or procedures. Diagnostic tests or procedures may include:  Blood tests.   An ultrasound.   An examination of the lining of the uterus (dilation and curettage, D&C).   An examination inside your abdomen or pelvis with a scope (laparoscopy).    X-rays.   CT Scan.   MRI.   An examination inside the bladder with a scope (cystoscopy).   An examination inside the intestine or stomach with a scope (colonoscopy, gastroscopy).  TREATMENT  Treatment depends on the cause of the dysmenorrhea. Treatment may include:  Pain medicine prescribed by your caregiver.   Birth control pills.   Hormone replacement therapy.   Nonsteroidal anti-inflammatory drugs (NSAIDs). These may help stop the production of prostaglandins.   An IUD with progesterone hormone in it.   Acupuncture.   Surgery to remove adhesions, endometriosis, ovarian cyst, or fibroids.   Removal of the uterus (hysterectomy).   Progesterone shots to stop the menstrual period.   Cutting the nerves on the sacrum that go to the female organs (presacral neurectomy).   Electric currant to the sacral nerves (sacral nerve stimulation).   Antidepressant medicine.   Psychiatric therapy, counseling, or group therapy.   Exercise and physical therapy.   Meditation and yoga therapy.  HOME CARE INSTRUCTIONS   Only take over-the-counter or prescription medicines for pain, discomfort, or fever as directed by your caregiver.   Place a heating pad or hot water bottle on your lower back or abdomen. Do not sleep with the heating pad.   Use aerobic exercises, walking, swimming, biking, and other exercises to help lessen the cramping.   Massage to the lower back or abdomen may help.   Stop smoking.   Avoid alcohol and caffeine.   Yoga, meditation, or acupuncture may help.  SEEK MEDICAL CARE IF:   The pain does not get better with medicine.   You have pain with sexual intercourse.  SEEK IMMEDIATE MEDICAL CARE IF:   Your pain increases and is not controlled with medicines.   You have a fever.   You develop nausea or vomiting with your period not controlled with medicine.   You have abnormal vaginal bleeding with your period.   You pass out.  MAKE SURE YOU:    Understand these instructions.   Will watch your condition.   Will get help right away if you are not doing well or get worse.  Document Released: 07/10/2005 Document Revised: 06/29/2011 Document Reviewed: 10/26/2008 [facility] Patient Information 2012 [place], [place].   RESOURCE GUIDE  Chronic Pain Problems: Contact [facility]  [phone] Patients need to be referred by their primary care doctor.  Insufficient Money for Medicine: Contact United Way:  call "211" or [facility] [phone].  No Primary Care Doctor: - Call [facility] Connect  [phone] - can help you locate a primary care doctor that  accepts your insurance, provides certain services, etc. - Physician Referral [phone]  Agencies that provide inexpensive medical care: - [facility]  [phone] - [facility]  [phone] - [facility]  [phone] - Women's Clinic  [phone] - Planned Parenthood  [phone] [facility]  [phone]  Medicaid-accepting [facility] Providers: [facility]- [address]  [phone], Mon-Fri 9am-7pm, Sat 9am-1pm - [facility]- [address] [place], [place]  [phone] - [facility] [place] [facility]- [address] [place]  [phone] [facility]- [address]  [phone] - Renaye Rakers- [address] [place], [address], [phone]  Only accepts [place] patients after they have their name  applied to their card  Self Pay (no insurance) in [facility]: - Sickle Cell Patients: Dr Willey Blade, [facility]  [address] [place], [phone] - [facility]- [address] [place]  [phone]       [facility] [place]- [address]       -     [facility]- see information above (Speak to [employer] if you do not have insurance)       -   [facility]- [address] [place], [phone]       -  [facility]- [address],  [phone]       -  [facility]- [address] [phone]       -  Dr Julio Sicks-  [address], [place], [phone]       -  [facility]- [address], [phone]       -  [facility]- [address] [phone], also [address] [phone]       -    [facility]- [address] [place], [phone], 1st & 3rd Saturday   every month, 10am-1pm  1) Find a Doctor and Pay Out of Pocket Although you won't have to find out who is covered by your insurance plan, it is a good idea to ask around and get recommendations. You will then need to call the office and see if the doctor you have chosen will accept you as a new patient and what types of options they offer for patients who are self-pay. Some doctors offer discounts or will set up payment plans for their patients who do not have insurance, but you will need to ask so you aren't surprised when you get to your appointment.  2) Contact Your Local Health Department Not all health departments have doctors that can see patients for sick visits, but many do, so it is worth a call to see if yours does. If you don't know where your local health department is, you can check in your phone book. The CDC also has a tool to help you locate your state's health department, and many state websites also have listings of all of their local health departments.  3) Find a Walk-in Clinic If your illness is not likely to be very severe or complicated, you may want to try a walk in clinic. These are popping up all over the country in pharmacies, drugstores, and shopping centers. They're usually staffed by nurse practitioners or physician assistants that have been trained to treat common illnesses and complaints. They're usually fairly quick and inexpensive. However, if you have serious medical issues or  chronic medical problems, these are probably not your best option  STD Testing - [facility] Department of [facility] [place], STD Clinic, [address] [place], phone [phone] or [phone].  Monday - Friday, call for an appointment. [facility] Department of [employer]  [place], STD Clinic, [address], [place], phone [phone] or [phone].  Monday - Friday, call for an appointment.  Abuse/Neglect: [facility] Child Abuse Hotline 8563909068 [facility] Child Abuse Hotline 667-724-1613 (After Hours)  Emergency Shelter:  [facility] 475-770-5731  Maternity Homes: - [facility] (986)380-4681 - [facility] [phone]  MRSA Hotline #:   463 309 9160  [facility]  [facility] [place]  United Way [facility] Dept. [address]                 [address]         [address] [place] [address]  Blondell Reveal Phone:  [phone]                                  Phone:  [phone]                   Phone:  [phone]  [facility], [phone] - [facility] - [phone]       -     [facility] in [place], [address],                                  [phone], [facility] Child Abuse Hotline (479)527-4903 or 406-646-3319 (After Hours)   Behavioral Health Services  Substance Abuse Resources: - [facility] and Drug Services  (660) [phone] - [facility] 709-270-1008 - [facility] [place] [phone] Floydene Flock [phone] - Residential & Outpatient Substance Abuse Program  208-695-8197  Psychological Services: [facility]  [phone] Services  [phone] - [facility], [phone] [place]. [address] [place], ACCESS LINE: 631-834-3482 or 417-119-6559, [employer]  Dental Assistance  If unable to pay or uninsured, contact:  [facility] or [facility]. to become qualified for the adult dental clinic.  Patients with Medicaid: [facility] [phone] [address], [phone] [address], [phone]  If unable to pay, or uninsured, contact [facility] 815-603-3547) or [facility] Department [phone] in [place], [phone] in [facility]) to become qualified for the adult dental clinic  Other Low-Cost Community Dental Services: - [facility]- [address] [place], [place], [place], [address], [phone], [address], 2nd and 4th Thursday of the month at 6:30am.  10 clients each day by appointment, can sometimes see walk-in patients if someone does not show for an appointment. [facility]- [address] [place], [place], [address], [phone] - [facility]- [address], [place], [place], [address], [phone] [facility] Health Department- [phone] [facility] Health Department- [phone] [facility] [phone]        Use the medications as  prescribed for your symptoms.  You may also use Mylanta or Maalox if needed for extra relief of your acid reflux.

## 2011-12-23 NOTE — ED Notes (Signed)
Pt c/o acid reflux and lower abd pain x 2 weeks.

## 2011-12-23 NOTE — ED Notes (Signed)
Patient with no complaints at this time. Respirations even and unlabored. Skin warm/dry. Discharge instructions reviewed with patient at this time. Patient given opportunity to voice concerns/ask questions. Patient discharged at this time and left Emergency Department with steady gait.   

## 2011-12-23 NOTE — ED Provider Notes (Signed)
Medical screening examination/treatment/procedure(s) were performed by non-physician practitioner and as supervising physician I was immediately available for consultation/collaboration.   Dayton Bailiff, MD 12/23/11 1108

## 2011-12-23 NOTE — ED Provider Notes (Signed)
History     CSN: [id number]  Arrival date & time 12/23/11  7253   First MD Initiated Contact with Patient 12/23/11 [phone]      Chief Complaint  Patient presents with  . Gastrophageal Reflux  . Abdominal Pain    (Consider location/radiation/quality/duration/timing/severity/associated sxs/prior treatment) HPI Comments: Pamela Ford presents with 2 concerns, the first being acid reflux which she experiences quite frequently, but has been worse for the past several weeks especially at night.  She woke early this morning with acid refluxing into the back of her throat.  She denies nausea or vomiting and also denies upper abdominal pain.  She does however have complaints of low were pelvic cramping which she associates with her menses.  She states that for the past year with her menses and just before her period she had increasing cramping pain which is not relieved with OTC Tylenol.  She is expecting her period this week as her LMP ended May 9.  She denies vaginal discharge and pregnancy.  She has tried no medications for her acid reflux, she has noted that certain foods tend to make it worse such as tomato-based foods like pizza.  She denies eating before bedtime.  She denies fevers or chills, no diarrhea or other change in bowel habits.  The history is provided by the patient.    Past Medical History  Diagnosis Date  . Ovarian cyst     History reviewed. No pertinent past surgical history.  History reviewed. No pertinent family history.  History  Substance Use Topics  . Smoking status: Current Everyday Smoker    Types: Cigarettes  . Smokeless tobacco: Not on file  . Alcohol Use: No    OB History    Grav Para Term Preterm Abortions TAB SAB Ect Mult Living                  Review of Systems  Constitutional: Negative for fever.  HENT: Negative for congestion, sore throat and neck pain.   Eyes: Negative.   Respiratory: Negative for chest tightness and shortness of breath.     Cardiovascular: Negative for chest pain.  Gastrointestinal: Negative for nausea, vomiting and abdominal pain.  Genitourinary: Positive for pelvic pain. Negative for vaginal discharge and difficulty urinating.  Musculoskeletal: Negative for joint swelling and arthralgias.  Skin: Negative.  Negative for rash and wound.  Neurological: Negative for dizziness, weakness, light-headedness, numbness and headaches.  Hematological: Negative.   Psychiatric/Behavioral: Negative.     Allergies  Review of patient's allergies indicates no known allergies.  Home Medications   Current Outpatient Rx  Name Route Sig Dispense Refill  . ACETAMINOPHEN 500 MG PO TABS Oral Take 500 mg by mouth every 6 (six) hours as needed. Menstrual cramping     . FAMOTIDINE 20 MG PO TABS Oral Take 1 tablet (20 mg total) by mouth 2 (two) times daily. 30 tablet 0  . TRAMADOL HCL 50 MG PO TABS Oral Take 1 tablet (50 mg total) by mouth every 6 (six) hours as needed for pain. 15 tablet 0    BP 133/78  Pulse 89  Temp(Src) 97.7 F (36.5 C) (Oral)  Resp 17  Ht 5\' 4"  (1.626 m)  Wt 208 lb (94.348 kg)  BMI 35.70 kg/m2  SpO2 99%  LMP 11/30/2011  Physical Exam  Nursing note and vitals reviewed. Constitutional: She appears well-developed and well-nourished.  HENT:  Head: Normocephalic and atraumatic.  Eyes: Conjunctivae are normal.  Neck: Normal range of motion.  Cardiovascular: Normal rate, regular rhythm, normal heart sounds and intact distal pulses.   Pulmonary/Chest: Effort normal and breath sounds normal. She has no wheezes.  Abdominal: Soft. Bowel sounds are normal. She exhibits no distension. There is no tenderness. There is no rebound.  Musculoskeletal: Normal range of motion.  Neurological: She is alert.  Skin: Skin is warm and dry.  Psychiatric: She has a normal mood and affect.    ED Course  Procedures (including critical care time)   Labs Reviewed  POCT PREGNANCY, URINE   No results found.   1.  Acid reflux   2. Dysmenorrhea       MDM  Patient with classic acid reflux symptoms has not tried any acid reducers.  Exam is benign today with no abdominal pain distention or abnormal findings.  Intermittent menstrual cramping.  Labs reviewed today, the patient is not pregnant.  She will be prescribed Ultram for cramping pain relief as NSAIDs will possibly increased acid reflux symptoms.  She was also given a prescription for Pepcid and encouraged to add Maalox or Mylanta if needed for additional acid reflux relief.  She'll be referred to Dr. Fransico Him to establish primary medical care,  Also given resource guide for obtaining primary care.        Burgess Amor, PA 12/23/11 0956  Burgess Amor, PA 12/23/11 [phone]

## 2011-12-23 NOTE — ED Notes (Signed)
Julie Idol, PA at bedside 

## 2012-02-19 ENCOUNTER — Emergency Department (HOSPITAL_COMMUNITY)
Admission: EM | Admit: 2012-02-19 | Discharge: 2012-02-19 | Disposition: A | Discharge status: Home / Self Care | Payer: Medicaid Other | Attending: Emergency Medicine | Admitting: Emergency Medicine

## 2012-02-19 ENCOUNTER — Encounter (HOSPITAL_COMMUNITY): Payer: Self-pay | Admitting: *Deleted

## 2012-02-19 ENCOUNTER — Emergency Department (HOSPITAL_COMMUNITY): Payer: Medicaid Other

## 2012-02-19 ENCOUNTER — Other Ambulatory Visit (HOSPITAL_COMMUNITY): Payer: Self-pay

## 2012-02-19 DIAGNOSIS — Z331 Pregnant state, incidental: Secondary | ICD-10-CM | POA: Insufficient documentation

## 2012-02-19 DIAGNOSIS — O26899 Other specified pregnancy related conditions, unspecified trimester: Secondary | ICD-10-CM

## 2012-02-19 DIAGNOSIS — Z3490 Encounter for supervision of normal pregnancy, unspecified, unspecified trimester: Secondary | ICD-10-CM

## 2012-02-19 DIAGNOSIS — N949 Unspecified condition associated with female genital organs and menstrual cycle: Secondary | ICD-10-CM | POA: Insufficient documentation

## 2012-02-19 DIAGNOSIS — R10819 Abdominal tenderness, unspecified site: Secondary | ICD-10-CM | POA: Insufficient documentation

## 2012-02-19 LAB — URINALYSIS, ROUTINE W REFLEX MICROSCOPIC
Bilirubin Urine: NEGATIVE
Glucose, UA: NEGATIVE mg/dL
Ketones, ur: NEGATIVE mg/dL
Leukocytes, UA: NEGATIVE
Protein, ur: NEGATIVE mg/dL

## 2012-02-19 LAB — BASIC METABOLIC PANEL
BUN: 4 mg/dL — ABNORMAL LOW (ref 6–23)
Calcium: 9.8 mg/dL (ref 8.4–10.5)
Creatinine, Ser: 0.82 mg/dL (ref 0.50–1.10)
GFR calc Af Amer: >90 mL/min (ref >90)
GFR calc non Af Amer: >90 mL/min (ref >90)
Glucose, Bld: 110 mg/dL — ABNORMAL HIGH (ref 70–99)

## 2012-02-19 LAB — CBC WITH DIFFERENTIAL/PLATELET
Basophils Relative: 0 % (ref 0–1)
Eosinophils Absolute: 0.2 10*3/uL (ref 0.0–0.7)
Eosinophils Relative: 4 % (ref 0–5)
HCT: 32.5 % — ABNORMAL LOW (ref 36.0–46.0)
Hemoglobin: 11.4 g/dL — ABNORMAL LOW (ref 12.0–15.0)
Lymphs Abs: 2 10*3/uL (ref 0.7–4.0)
MCH: 32 pg (ref 26.0–34.0)
MCHC: 35.1 g/dL (ref 30.0–36.0)
MCV: 91.3 fL (ref 78.0–100.0)
Monocytes Absolute: 0.3 10*3/uL (ref 0.1–1.0)
Neutro Abs: 2.7 10*3/uL (ref 1.7–7.7)
Neutrophils Relative %: 52 % (ref 43–77)

## 2012-02-19 LAB — ABO/RH: ABO/RH(D): B POS

## 2012-02-19 MED ORDER — PRENATAL COMPLETE 14-0.4 MG PO TABS: 1.0000 | ORAL_TABLET | Freq: Every day | ORAL | Status: DC

## 2012-02-19 NOTE — ED Provider Notes (Addendum)
History     CSN: [id number]  Arrival date & time 02/19/12  1344   First MD Initiated Contact with Patient 02/19/12 1426      Chief Complaint  Patient presents with  . Abdominal Pain    (Consider location/radiation/quality/duration/timing/severity/associated sxs/prior treatment) Patient is a 26 y.o. female presenting with abdominal pain. The history is provided by the patient.  Abdominal Pain The primary symptoms of the illness include abdominal pain and vaginal bleeding. The primary symptoms of the illness do not include shortness of breath, nausea, vomiting or diarrhea.  Symptoms associated with the illness do not include back pain.   patient presented with lower abdominal pain for last week. It is dull and constant. Is worse with movement. She states she's had some vaginal spotting. No other vaginal discharge. No dysuria. No nausea vomiting or diarrhea. No constipation. She states she missed her period for July. Her last menstrual period was June 6.  Past Medical History  Diagnosis Date  . Ovarian cyst     History reviewed. No pertinent past surgical history.  No family history on file.  History  Substance Use Topics  . Smoking status: Current Everyday Smoker    Types: Cigarettes  . Smokeless tobacco: Not on file  . Alcohol Use: No    OB History    Grav Para Term Preterm Abortions TAB SAB Ect Mult Living                  Review of Systems  Constitutional: Negative for activity change and appetite change.  HENT: Negative for neck stiffness.   Eyes: Negative for pain.  Respiratory: Negative for chest tightness and shortness of breath.   Cardiovascular: Negative for chest pain and leg swelling.  Gastrointestinal: Positive for abdominal pain. Negative for nausea, vomiting and diarrhea.  Genitourinary: Positive for vaginal bleeding. Negative for flank pain.  Musculoskeletal: Negative for back pain.  Skin: Negative for rash.  Neurological: Negative for weakness,  numbness and headaches.  Psychiatric/Behavioral: Negative for behavioral problems.    Allergies  Review of patient's allergies indicates no known allergies.  Home Medications   Current Outpatient Rx  Name Route Sig Dispense Refill  . ACETAMINOPHEN 500 MG PO TABS Oral Take 500 mg by mouth every 6 (six) hours as needed. Menstrual cramping     . FAMOTIDINE 20 MG PO TABS Oral Take 1 tablet (20 mg total) by mouth 2 (two) times daily. 30 tablet 0    BP 125/64  Pulse 76  Temp 97.4 F (36.3 C) (Oral)  Resp 20  Ht 5\' 4"  (1.626 m)  Wt 208 lb (94.348 kg)  BMI 35.70 kg/m2  SpO2 100%  LMP 12/28/2011  Physical Exam  Nursing note and vitals reviewed. Constitutional: She is oriented to person, place, and time. She appears well-developed and well-nourished.  HENT:  Head: Normocephalic and atraumatic.  Eyes: EOM are normal. Pupils are equal, round, and reactive to light.  Neck: Normal range of motion. Neck supple.  Cardiovascular: Normal rate, regular rhythm and normal heart sounds.   No murmur heard. Pulmonary/Chest: Effort normal and breath sounds normal. No respiratory distress. She has no wheezes. She has no rales.  Abdominal: Soft. Bowel sounds are normal. She exhibits no distension. There is tenderness. There is no rebound and no guarding.       Suprapubic tenderness without rebound or guarding.  Musculoskeletal: Normal range of motion.  Neurological: She is alert and oriented to person, place, and time. No cranial nerve deficit.  Skin: Skin is warm and dry.  Psychiatric: She has a normal mood and affect. Her speech is normal.   pelvic exam showed some bloody discharge. Os appears closed, however cervix was quite posterior.  ED Course  Procedures (including critical care time)  Labs Reviewed  POCT PREGNANCY, URINE - Abnormal; Notable for the following:    Preg Test, Ur POSITIVE (*)     All other components within normal limits  URINALYSIS, ROUTINE W REFLEX MICROSCOPIC  CBC  WITH DIFFERENTIAL  BASIC METABOLIC PANEL  HCG, QUANTITATIVE, PREGNANCY  WET PREP, GENITAL  GC/CHLAMYDIA PROBE AMP, GENITAL   No results found.   No diagnosis found.    MDM  Patient with lower abdominal pain and positive pregnancy test here. Patient will get quantitative hCG and ultrasound to rule out ectopic. Care will be turned over to Dr. Preston Fleeting.        Juliet Rude. 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 She will be given prescriptions for prenatal vitamins and advised to obtain prenatal care.  Results for orders placed during the hospital encounter of 02/19/12  URINALYSIS, ROUTINE W REFLEX MICROSCOPIC      Component Value Range   Color, Urine YELLOW  YELLOW   APPearance CLEAR  CLEAR   Specific Gravity, Urine <1.005 (*) 1.005 - 1.030   pH 6.0  5.0 - 8.0   Glucose, UA NEGATIVE  NEGATIVE mg/dL   Hgb urine dipstick TRACE (*) NEGATIVE   Bilirubin Urine NEGATIVE  NEGATIVE   Ketones, ur NEGATIVE  NEGATIVE mg/dL   Protein, ur NEGATIVE  NEGATIVE mg/dL   Urobilinogen, UA 0.2  0.0 - 1.0 mg/dL   Nitrite NEGATIVE  NEGATIVE   Leukocytes, UA NEGATIVE  NEGATIVE  POCT PREGNANCY, URINE      Component Value Range   Preg Test, Ur POSITIVE (*) NEGATIVE  CBC WITH DIFFERENTIAL      Component Value Range   WBC 5.2  4.0 - 10.5 K/uL   RBC 3.56 (*) 3.87 - 5.11 MIL/uL   Hemoglobin 11.4 (*) 12.0 - 15.0 g/dL   HCT 40.9 (*) 81.1 - 91.4 %   MCV 91.3  78.0 - 100.0 fL   MCH 32.0  26.0 - 34.0 pg   MCHC 35.1  30.0 - 36.0 g/dL   RDW 78.2  95.6 - 21.3 %   Platelets    150 - 400 K/uL   Value: PLATELET CLUMPS NOTED ON SMEAR, COUNT APPEARS DECREASED   Neutrophils Relative 52  43 - 77 %   Lymphocytes Relative 39  12 - 46 %   Monocytes Relative 5  3 - 12 %   Eosinophils Relative 4  0 - 5 %   Basophils Relative 0  0 - 1 %   Neutro Abs 2.7  1.7 - 7.7 K/uL   Lymphs Abs 2.0  0.7 - 4.0 K/uL   Monocytes Absolute 0.3   0.1 - 1.0 K/uL   Eosinophils Absolute 0.2  0.0 - 0.7 K/uL   Basophils Absolute 0.0  0.0 - 0.1 K/uL  BASIC METABOLIC PANEL      Component Value Range   Sodium 139  135 - 145 mEq/L   Potassium 3.8  3.5 - 5.1 mEq/L   Chloride 104  96 - 112 mEq/L   CO2 25  19 - 32 mEq/L   Glucose, Bld 110 (*) 70 - 99 mg/dL   BUN 4 (*) 6 -  23 mg/dL   Creatinine, Ser 7.82  0.50 - 1.10 mg/dL   Calcium 9.8  8.4 - 95.6 mg/dL   GFR calc non Af Amer >90  >90 mL/min   GFR calc Af Amer >90  >90 mL/min  HCG, QUANTITATIVE, PREGNANCY      Component Value Range   hCG, Beta Chain, Quant, S 21308 (*) <5 mIU/mL  WET PREP, GENITAL      Component Value Range   Yeast Wet Prep HPF POC NONE SEEN  NONE SEEN   Trich, Wet Prep NONE SEEN  NONE SEEN   Clue Cells Wet Prep HPF POC NONE SEEN  NONE SEEN   WBC, Wet Prep HPF POC FEW (*) NONE SEEN  ABO/RH      Component Value Range   ABO/RH(D) B POS    URINE MICROSCOPIC-ADD ON      Component Value Range   Squamous Epithelial / LPF RARE  RARE   WBC, UA 0-2  <3 WBC/hpf   RBC / HPF 0-2  <3 RBC/hpf   Bacteria, UA RARE  RARE   US Ob Comp Less 14 Wks  02/19/2012  *RADIOLOGY REPORT*  Clinical Data: Missed menstrual period.  Positive urine pregnancy test.  7 weeks and 4 days pregnant by last menstrual period.  OBSTETRIC <14 WK [place] AND TRANSVAGINAL OB US  Technique:  Both transabdominal and transvaginal ultrasound examinations were performed for complete evaluation of the gestation as well as the maternal uterus, adnexal regions, and pelvic cul-de-sac.  Transvaginal technique was performed to assess early pregnancy.  Comparison:  Previous examinations.  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  Normal in size and shape. Yolk sac: Visualized, normal in size and shape. Embryo: Visualized. Cardiac Activity: Visualized. Heart Rate: 163 bpm  CRL: 13.2  mm  7 w  4 d             [place] EDC:  10/03/2012.  Maternal uterus/adnexae: Normal appearing ovaries.  Trace amount of free peritoneal fluid. Previously noted Mullerian duct anomaly.  No subchorionic hemorrhage seen.  IMPRESSION:  1.  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  Again, the exact uterine anomaly can be better determined with MRI of the pelvis. 2.  No complicating features.  Original Report Authenticated By: Darrol Angel, M.D.     Dione Booze, MD 02/19/12 [phone]

## 2012-02-19 NOTE — ED Notes (Signed)
Pt states lower abdominal  abdominal pain and missed period for July. LMP was June 6th. Spotting recently, pink in color.

## 2012-02-20 LAB — GC/CHLAMYDIA PROBE AMP, GENITAL
Chlamydia, DNA Probe: NEGATIVE
GC Probe Amp, Genital: NEGATIVE

## 2012-02-27 LAB — OB RESULTS CONSOLE RUBELLA ANTIBODY, IGM: Rubella: IMMUNE

## 2012-02-27 LAB — OB RESULTS CONSOLE HEPATITIS B SURFACE ANTIGEN: Hepatitis B Surface Ag: NEGATIVE

## 2012-03-05 ENCOUNTER — Encounter (HOSPITAL_COMMUNITY): Payer: Self-pay

## 2012-03-05 ENCOUNTER — Emergency Department (HOSPITAL_COMMUNITY)
Admission: EM | Admit: 2012-03-05 | Discharge: 2012-03-05 | Disposition: A | Discharge status: Home / Self Care | Payer: Medicaid Other | Attending: Emergency Medicine | Admitting: Emergency Medicine

## 2012-03-05 DIAGNOSIS — R12 Heartburn: Secondary | ICD-10-CM | POA: Insufficient documentation

## 2012-03-05 DIAGNOSIS — O99891 Other specified diseases and conditions complicating pregnancy: Secondary | ICD-10-CM | POA: Insufficient documentation

## 2012-03-05 DIAGNOSIS — K219 Gastro-esophageal reflux disease without esophagitis: Secondary | ICD-10-CM | POA: Insufficient documentation

## 2012-03-05 DIAGNOSIS — Z87891 Personal history of nicotine dependence: Secondary | ICD-10-CM | POA: Insufficient documentation

## 2012-03-05 DIAGNOSIS — O26899 Other specified pregnancy related conditions, unspecified trimester: Secondary | ICD-10-CM

## 2012-03-05 HISTORY — DX: Gastro-esophageal reflux disease without esophagitis: K21.9

## 2012-03-05 NOTE — ED Notes (Signed)
Pt seen and assessed by EDPa for primary assessment.

## 2012-03-05 NOTE — ED Notes (Signed)
Pt reports sinus drainage, pressure, and cough x 1 week.  Also reports throat feels sore.

## 2012-03-07 NOTE — ED Provider Notes (Signed)
History     CSN: [id number]  Arrival date & time 03/05/12  1652   First MD Initiated Contact with Patient 03/05/12 1728      Chief Complaint  Patient presents with  . Nasal Congestion    (Consider location/radiation/quality/duration/timing/severity/associated sxs/prior treatment) HPI Comments: Pamela Ford presents with nasal congestion with clear drainage along with sinus congestion, pressure and a nonproductive cough and sore throat.  She has a history of allergy and acid reflux disease as well,  And has noticed increased heartburn over the past several weeks.  She is currently pregnant in her first trimester, approximately 8 weeks.  She was taking pepcid for her acid reflux but stopped when she found out she was pregnant.  She denies fevers, chills, headache and has had no purulent or bloody nasal discharge.  She also denies shortness of breath, chest or abdominal pain.  She does have occasional nausea which is mild.  The history is provided by the patient.    Past Medical History  Diagnosis Date  . Ovarian cyst   . Acid reflux     History reviewed. No pertinent past surgical history.  No family history on file.  History  Substance Use Topics  . Smoking status: Former Smoker    Types: Cigarettes  . Smokeless tobacco: Not on file  . Alcohol Use: No    OB History    Grav Para Term Preterm Abortions TAB SAB Ect Mult Living                  Review of Systems  Constitutional: Negative for fever.  HENT: Positive for congestion, sore throat, rhinorrhea and sinus pressure. Negative for ear pain, nosebleeds, neck pain, neck stiffness and tinnitus.   Eyes: Negative.   Respiratory: Negative for chest tightness and shortness of breath.   Cardiovascular: Negative for chest pain.  Gastrointestinal: Positive for nausea. Negative for vomiting and abdominal pain.  Genitourinary: Negative.   Musculoskeletal: Negative for joint swelling and arthralgias.  Skin: Negative.   Negative for rash and wound.  Neurological: Negative for dizziness, weakness, light-headedness, numbness and headaches.  Hematological: Negative.   Psychiatric/Behavioral: Negative.     Allergies  Review of patient's allergies indicates no known allergies.  Home Medications   Current Outpatient Rx  Name Route Sig Dispense Refill  . ACETAMINOPHEN 500 MG PO TABS Oral Take 500 mg by mouth every 6 (six) hours as needed. Menstrual cramping     . PRENATAL COMPLETE 14-0.4 MG PO TABS Oral Take 1 tablet by mouth daily. 30 each 0    BP 133/78  Pulse 78  Temp 98.2 F (36.8 C) (Oral)  Resp 18  Ht 5\' 4"  (1.626 m)  Wt 228 lb (103.42 kg)  BMI 39.14 kg/m2  SpO2 100%  LMP 12/28/2011  Physical Exam  Nursing note and vitals reviewed. Constitutional: She appears well-developed and well-nourished.  HENT:  Head: Normocephalic and atraumatic.  Right Ear: External ear normal.  Left Ear: External ear normal.  Nose: Mucosal edema present. No rhinorrhea. No epistaxis. Right sinus exhibits no maxillary sinus tenderness and no frontal sinus tenderness. Left sinus exhibits no maxillary sinus tenderness and no frontal sinus tenderness.  Mouth/Throat: Uvula is midline, oropharynx is clear and moist and mucous membranes are normal. No oropharyngeal exudate, posterior oropharyngeal edema, posterior oropharyngeal erythema or tonsillar abscesses.  Eyes: Conjunctivae are normal.  Neck: Normal range of motion.  Cardiovascular: Normal rate, regular rhythm, normal heart sounds and intact distal pulses.   Pulmonary/Chest: Effort  normal and breath sounds normal. She has no wheezes.  Abdominal: Soft. Bowel sounds are normal. There is no tenderness.  Musculoskeletal: Normal range of motion.  Neurological: She is alert.  Skin: Skin is warm and dry.  Psychiatric: She has a normal mood and affect.    ED Course  Procedures (including critical care time)  Labs Reviewed - No data to display No results  found.   1. Heartburn in pregnancy       MDM  Discussed with pt symptoms in pregnancy,  Including nasal congestion,  Increase acid reflux.  Encouraged maalox or mylanta which are safer meds in pregnancy.  Also given patient a handout about safe meds in pregnancy.  Discussed using saline nasal spray which may help with nasal congestion.  No signs or sx of sinusitis on todays exam,  Also no sign of oropharyngeal infection,  Suspect sore throat due to irritation from post nasal drip.  Suggested salt gargles,  Sore throat lozenges,  F/u with her gynecologist for further care.  The patient appears reasonably screened and/or stabilized for discharge and I doubt any other medical condition or other [facility] requiring further screening, evaluation, or treatment in the ED at this time prior to discharge.         Burgess Amor, PA 03/09/12 1455

## 2012-03-09 NOTE — ED Provider Notes (Signed)
Medical screening examination/treatment/procedure(s) were performed by non-physician practitioner and as supervising physician I was immediately available for consultation/collaboration.   Laray Anger, DO 03/09/12 1655

## 2012-03-13 ENCOUNTER — Other Ambulatory Visit: Payer: Self-pay | Admitting: Adult Health

## 2012-03-13 ENCOUNTER — Other Ambulatory Visit (HOSPITAL_COMMUNITY)
Admission: RE | Admit: 2012-03-13 | Discharge: 2012-03-13 | Disposition: A | Discharge status: Home / Self Care | Payer: Medicaid Other | Source: Ambulatory Visit | Attending: Obstetrics and Gynecology | Admitting: Obstetrics and Gynecology

## 2012-03-13 DIAGNOSIS — Z01419 Encounter for gynecological examination (general) (routine) without abnormal findings: Secondary | ICD-10-CM | POA: Insufficient documentation

## 2012-03-13 DIAGNOSIS — Z113 Encounter for screening for infections with a predominantly sexual mode of transmission: Secondary | ICD-10-CM | POA: Insufficient documentation

## 2012-04-04 ENCOUNTER — Emergency Department (HOSPITAL_COMMUNITY)
Admission: EM | Admit: 2012-04-04 | Discharge: 2012-04-05 | Disposition: A | Discharge status: Home / Self Care | Payer: Medicaid Other | Attending: Emergency Medicine | Admitting: Emergency Medicine

## 2012-04-04 ENCOUNTER — Encounter (HOSPITAL_COMMUNITY): Payer: Self-pay

## 2012-04-04 DIAGNOSIS — O9989 Other specified diseases and conditions complicating pregnancy, childbirth and the puerperium: Secondary | ICD-10-CM | POA: Insufficient documentation

## 2012-04-04 DIAGNOSIS — K5904 Chronic idiopathic constipation: Secondary | ICD-10-CM

## 2012-04-04 DIAGNOSIS — K219 Gastro-esophageal reflux disease without esophagitis: Secondary | ICD-10-CM | POA: Insufficient documentation

## 2012-04-04 DIAGNOSIS — K5900 Constipation, unspecified: Secondary | ICD-10-CM | POA: Insufficient documentation

## 2012-04-04 DIAGNOSIS — Z87891 Personal history of nicotine dependence: Secondary | ICD-10-CM | POA: Insufficient documentation

## 2012-04-04 MED ORDER — DOCUSATE SODIUM 100 MG PO CAPS: 100.0000 mg | ORAL_CAPSULE | Freq: Two times a day (BID) | ORAL | Status: AC

## 2012-04-04 NOTE — ED Provider Notes (Signed)
History  This chart was scribed for Derwood Kaplan, MD by Ladona Ridgel Day. This patient was seen in room APA06/AP[facility]06 and the patient's care was started at 2140.   CSN: [id number]  Arrival date & time 04/04/12  2140   First MD Initiated Contact with Patient 04/04/12 2151      Chief Complaint  Patient presents with  . Constipation   The history is provided by the patient. No language interpreter was used.   Pamela Ford is a 26 y.o. female who presents to the Emergency Department complaining of constipation after taking OTC medications for GERD. She states she is [redacted] weeks pregnant and this is her first pregnancy. She states she had a BM today but feels she is constipated now with minimal abdominal discomfort. She denies any previous episodes of constipation and has not tried taking any medicines for this problem yet. She has had no abdominal surgeries, no nausea, emesis, diarrhea, urinary sx or fevers.   Past Medical History  Diagnosis Date  . Ovarian cyst   . Acid reflux     History reviewed. No pertinent past surgical history.  No family history on file.  History  Substance Use Topics  . Smoking status: Former Smoker    Types: Cigarettes  . Smokeless tobacco: Not on file  . Alcohol Use: No    OB History    Grav Para Term Preterm Abortions TAB SAB Ect Mult Living   1               Review of Systems  Constitutional: Negative for fever and chills.  HENT: Negative for congestion and rhinorrhea.   Respiratory: Negative for shortness of breath.   Cardiovascular: Negative for chest pain.  Gastrointestinal: Positive for constipation. Negative for nausea and vomiting.  Musculoskeletal: Negative for back pain.  Skin: Negative for color change.  Neurological: Negative for weakness.  All other systems reviewed and are negative.    Allergies  Review of patient's allergies indicates no known allergies.  Home Medications   Current Outpatient Rx  Name Route Sig Dispense Refill    . ACETAMINOPHEN 500 MG PO TABS Oral Take 500 mg by mouth every 6 (six) hours as needed. Menstrual cramping     . PRENATAL COMPLETE 14-0.4 MG PO TABS Oral Take 1 tablet by mouth daily. 30 each 0    Triage Vitals: BP 130/88  Pulse 93  Temp 98.2 F (36.8 C) (Oral)  Resp 20  Ht 5\' 3"  (1.6 m)  Wt 226 lb (102.513 kg)  BMI 40.03 kg/m2  SpO2 100%  LMP 12/28/2011  Physical Exam  Nursing note and vitals reviewed. Constitutional: She is oriented to person, place, and time. She appears well-developed and well-nourished. No distress.  HENT:  Head: Normocephalic and atraumatic.  Eyes: EOM are normal.  Neck: Neck supple. No tracheal deviation present.  Cardiovascular: Normal rate, regular rhythm and normal heart sounds.   Pulmonary/Chest: Effort normal. No respiratory distress.  Abdominal: Soft. She exhibits no distension. There is no tenderness. There is no rebound and no guarding.  Musculoskeletal: Normal range of motion.  Neurological: She is alert and oriented to person, place, and time.  Skin: Skin is warm and dry.  Psychiatric: She has a normal mood and affect. Her behavior is normal.    ED Course  Procedures (including critical care time) DIAGNOSTIC STUDIES: Oxygen Saturation is 100% on room air, normal by my interpretation.    COORDINATION OF CARE: At 1110 PM Discussed treatment plan with patient which  includes stool softener. Patient agrees.   Labs Reviewed - No data to display No results found.   No diagnosis found.    MDM  Medical screening examination/treatment/procedure(s) were performed by me as the supervising physician. Scribe service was utilized for documentation only.  Pt comes in with cc of constipation. She is g1p0. Passing gas, having BM that are just not normal. She has no abd pain, no cramping, no vaginal bleeding, discharge, UTI like sx.  No imaging or labs indicated. Has an appt with OB next week. Will give docusate.         Derwood Kaplan, MD 04/04/12 [phone]

## 2012-04-04 NOTE — ED Notes (Signed)
Been having acid reflux and they told me that I could take otc meds for it. Think it is making me constipated per pt. I am [redacted] weeks pregnant and my stomach is irritable per pt.

## 2012-04-04 NOTE — ED Notes (Signed)
Fetal heart tones found in mid lower abdomen, rate of 160-170 with good variability. Dr. Rhunette Croft notified.

## 2012-05-08 ENCOUNTER — Other Ambulatory Visit: Payer: Self-pay

## 2012-05-08 ENCOUNTER — Other Ambulatory Visit: Payer: Self-pay | Admitting: Obstetrics & Gynecology

## 2012-05-08 DIAGNOSIS — O4100X0 Oligohydramnios, unspecified trimester, not applicable or unspecified: Secondary | ICD-10-CM

## 2012-05-13 ENCOUNTER — Ambulatory Visit (HOSPITAL_COMMUNITY)
Admission: RE | Admit: 2012-05-13 | Discharge: 2012-05-13 | Disposition: A | Discharge status: Home / Self Care | Payer: Medicaid Other | Source: Ambulatory Visit | Attending: Obstetrics & Gynecology | Admitting: Obstetrics & Gynecology

## 2012-05-13 ENCOUNTER — Encounter (HOSPITAL_COMMUNITY): Payer: Self-pay

## 2012-05-13 VITALS — BP 143/64 | HR 87 | Wt 232.0 lb

## 2012-05-13 DIAGNOSIS — Z1389 Encounter for screening for other disorder: Secondary | ICD-10-CM | POA: Insufficient documentation

## 2012-05-13 DIAGNOSIS — O358XX0 Maternal care for other (suspected) fetal abnormality and damage, not applicable or unspecified: Secondary | ICD-10-CM | POA: Insufficient documentation

## 2012-05-13 DIAGNOSIS — O4100X0 Oligohydramnios, unspecified trimester, not applicable or unspecified: Secondary | ICD-10-CM

## 2012-05-13 DIAGNOSIS — Z363 Encounter for antenatal screening for malformations: Secondary | ICD-10-CM | POA: Insufficient documentation

## 2012-05-13 NOTE — Progress Notes (Signed)
Dekisha Fullenwider  was seen today for an ultrasound appointment.  See full report in [facility].  Alpha Gula, MD  Single IUP at 19 4/7 weeks The amniotic fluid is subjectively decreased, but a max verticle pocket of 5 cm was noted Limited views of the anatomy were obtained due to fetal presentation and maternal body habitus (heart, face) No gross anomalies were noted No markers associated with aneuploidy were seen  Recommend follow up ultrasound in 4 weeks to reevaluate the fetal anatomy.

## 2012-05-27 ENCOUNTER — Encounter (HOSPITAL_COMMUNITY): Payer: Self-pay | Admitting: Emergency Medicine

## 2012-05-27 ENCOUNTER — Emergency Department (HOSPITAL_COMMUNITY)
Admission: EM | Admit: 2012-05-27 | Discharge: 2012-05-27 | Disposition: A | Discharge status: Home / Self Care | Payer: Medicaid Other | Attending: Emergency Medicine | Admitting: Emergency Medicine

## 2012-05-27 DIAGNOSIS — R109 Unspecified abdominal pain: Secondary | ICD-10-CM | POA: Insufficient documentation

## 2012-05-27 DIAGNOSIS — Z8742 Personal history of other diseases of the female genital tract: Secondary | ICD-10-CM | POA: Insufficient documentation

## 2012-05-27 DIAGNOSIS — Z87891 Personal history of nicotine dependence: Secondary | ICD-10-CM | POA: Insufficient documentation

## 2012-05-27 DIAGNOSIS — K219 Gastro-esophageal reflux disease without esophagitis: Secondary | ICD-10-CM | POA: Insufficient documentation

## 2012-05-27 DIAGNOSIS — O479 False labor, unspecified: Secondary | ICD-10-CM | POA: Insufficient documentation

## 2012-05-27 DIAGNOSIS — M549 Dorsalgia, unspecified: Secondary | ICD-10-CM | POA: Insufficient documentation

## 2012-05-27 DIAGNOSIS — Z3490 Encounter for supervision of normal pregnancy, unspecified, unspecified trimester: Secondary | ICD-10-CM

## 2012-05-27 DIAGNOSIS — R110 Nausea: Secondary | ICD-10-CM | POA: Insufficient documentation

## 2012-05-27 LAB — URINALYSIS, ROUTINE W REFLEX MICROSCOPIC
Bilirubin Urine: NEGATIVE
Glucose, UA: NEGATIVE mg/dL
Nitrite: NEGATIVE
Specific Gravity, Urine: <1.005 — ABNORMAL LOW (ref 1.005–1.030)
pH: 6 (ref 5.0–8.0)

## 2012-05-27 LAB — URINE MICROSCOPIC-ADD ON

## 2012-05-27 NOTE — Progress Notes (Signed)
Dr Penne Lash advised of Vena Austria, at [facility], pt complaint, fetal heart tones, no UC's.  Dr Jearld Lesch ok with d/c'ing fetal monitoring, cont. toco monitoring, recommended cervical exam.  OK to d/c home if cervix is closed.

## 2012-05-27 NOTE — ED Provider Notes (Signed)
History   This chart was scribed for Shelda Jakes, MD, by Frederik Pear. The patient was seen in room APA01/APA01 and the patient's care was started at 2158.    CSN: [id number]  Arrival date & time 05/27/12  2059   First MD Initiated Contact with Patient 05/27/12 2158      Chief Complaint  Patient presents with  . Abdominal Pain  . Nausea    (Consider location/radiation/quality/duration/timing/severity/associated sxs/prior treatment) HPI Comments: Pamela Ford is a 26 y.o. female who presents to the Emergency Department complaining of mild, constant abdominal pain with associated back pain and nausea that began earlier today. She denies any vaginal bleeding, fever, vomiting, or diarrhea. She reports that she is 21 weeks into her first pregnancy with her due date being Mar. 13, 2014. She states that her water has not broken. She reports that her last visit with Dr. Emelda Fear was 4 weeks ago and that she is scheduled for next appointment next week.     Patient is a 26 y.o. female presenting with abdominal pain.  Abdominal Pain The primary symptoms of the illness include abdominal pain and nausea. The primary symptoms of the illness do not include fever, vomiting, diarrhea or vaginal bleeding.  Additional symptoms associated with the illness include back pain.    Past Medical History  Diagnosis Date  . Ovarian cyst   . Acid reflux     History reviewed. No pertinent past surgical history.  History reviewed. No pertinent family history.  History  Substance Use Topics  . Smoking status: Former Smoker    Types: Cigarettes  . Smokeless tobacco: Not on file  . Alcohol Use: No    OB History    Grav Para Term Preterm Abortions TAB SAB Ect Mult Living   1               Review of Systems  Constitutional: Negative for fever.  Gastrointestinal: Positive for nausea and abdominal pain. Negative for vomiting and diarrhea.  Genitourinary: Negative for vaginal bleeding.    Musculoskeletal: Positive for back pain.  All other systems reviewed and are negative.    Allergies  Review of patient's allergies indicates no known allergies.  Home Medications   Current Outpatient Rx  Name  Route  Sig  Dispense  Refill  . PRENATAL COMPLETE 14-0.4 MG PO TABS   Oral   Take 1 tablet by mouth daily.   30 each   0     BP 128/74  Pulse 105  Temp 97.8 F (36.6 C) (Oral)  Resp 20  Ht 5\' 4"  (1.626 m)  Wt 226 lb (102.513 kg)  BMI 38.79 kg/m2  SpO2 100%  LMP 12/28/2011  Physical Exam  Nursing note and vitals reviewed. Constitutional: She is oriented to person, place, and time. She appears well-developed and well-nourished.  Eyes: Conjunctivae normal and EOM are normal. Pupils are equal, round, and reactive to light. Right eye exhibits no discharge. Left eye exhibits no discharge. No scleral icterus.  Neck: Normal range of motion.  Cardiovascular: Normal rate, regular rhythm and normal heart sounds.   No murmur heard. Pulmonary/Chest: Effort normal and breath sounds normal. No respiratory distress. She has no wheezes. She has no rales. She exhibits no tenderness.  Abdominal: Bowel sounds are normal. There is no tenderness.  Genitourinary: No vaginal discharge found.       Gravid uterus approximately one week size. Above the umbilicus. No palpable contractions. Cervix is closed not dilated. It is soft.  Musculoskeletal: Normal range of motion.  Neurological: She is alert and oriented to person, place, and time. No cranial nerve deficit. She exhibits normal muscle tone. Coordination normal.  Skin: No rash noted. No erythema.       Capillary refill is 1 sec in her toes.    ED Course  Procedures (including critical care time)  DIAGNOSTIC STUDIES: Oxygen Saturation is 100% on room air, normal by my interpretation.    COORDINATION OF CARE:  22:22- Discussed planned course of treatment with the patient, including a UA, who is agreeable at this  time.  Results for orders placed during the hospital encounter of 05/27/12  URINALYSIS, ROUTINE W REFLEX MICROSCOPIC      Component Value Range   Color, Urine YELLOW  YELLOW   APPearance CLEAR  CLEAR   Specific Gravity, Urine <1.005 (*) 1.005 - 1.030   pH 6.0  5.0 - 8.0   Glucose, UA NEGATIVE  NEGATIVE mg/dL   Hgb urine dipstick TRACE (*) NEGATIVE   Bilirubin Urine NEGATIVE  NEGATIVE   Ketones, ur NEGATIVE  NEGATIVE mg/dL   Protein, ur NEGATIVE  NEGATIVE mg/dL   Urobilinogen, UA 0.2  0.0 - 1.0 mg/dL   Nitrite NEGATIVE  NEGATIVE   Leukocytes, UA TRACE (*) NEGATIVE  URINE MICROSCOPIC-ADD ON      Component Value Range   Squamous Epithelial / LPF MANY (*) RARE   WBC, UA 0-2  <3 WBC/hpf   RBC / HPF 0-2  <3 RBC/hpf   Bacteria, UA FEW (*) RARE     Labs Reviewed  URINALYSIS, ROUTINE W REFLEX MICROSCOPIC - Abnormal; Notable for the following:    Specific Gravity, Urine <1.005 (*)     Hgb urine dipstick TRACE (*)     Leukocytes, UA TRACE (*)     All other components within normal limits  URINE MICROSCOPIC-ADD ON - Abnormal; Notable for the following:    Squamous Epithelial / LPF MANY (*)     Bacteria, UA FEW (*)     All other components within normal limits   No results found.   1. Pregnancy   2. Braxton Hick's contraction       MDM  Patient symptoms most likely consistent with Braxton Hicks contractions. Patient was monitored information sent to labor and delivery. Fetal heart rates have been fine no evidence of any significant contractions. Cervix was checked by me and is not dilated. Dr. Penne Lash said that if cervix was okay based on the findings we were getting a they were comfortable with her going home. Patient will followup with her OB/GYN Dr. In the next few days. Patient will return for any newer worse symptoms. Urinalysis is not consistent with urinary tract infection. [name] pressure is the not elevated.    I personally performed the services described in this  documentation, which was scribed in my presence. The recorded information has been reviewed and considered.         Shelda Jakes, MD 05/27/12 [phone]

## 2012-05-27 NOTE — ED Notes (Signed)
Patient states she is [redacted] weeks pregnant and has been having tightening off and on in her abdomen starting earlier today. Also complaining of nausea.

## 2012-05-27 NOTE — ED Notes (Signed)
Pamela Ford from [facility] called to say that baby looks good on the monitor and patient has had no contractions.  Per Dr. Penne Lash, if cervix is closed, she is comfortable to send patient home.  Dr. Deretha Emory checked cervix and it is closed.

## 2012-05-28 ENCOUNTER — Encounter: Payer: Self-pay | Admitting: *Deleted

## 2012-06-10 ENCOUNTER — Ambulatory Visit (HOSPITAL_COMMUNITY)
Admission: RE | Admit: 2012-06-10 | Discharge: 2012-06-10 | Disposition: A | Discharge status: Home / Self Care | Payer: Medicaid Other | Source: Ambulatory Visit | Attending: Obstetrics & Gynecology | Admitting: Obstetrics & Gynecology

## 2012-06-10 DIAGNOSIS — O4100X0 Oligohydramnios, unspecified trimester, not applicable or unspecified: Secondary | ICD-10-CM

## 2012-06-10 DIAGNOSIS — Z363 Encounter for antenatal screening for malformations: Secondary | ICD-10-CM | POA: Insufficient documentation

## 2012-06-10 DIAGNOSIS — O358XX0 Maternal care for other (suspected) fetal abnormality and damage, not applicable or unspecified: Secondary | ICD-10-CM | POA: Insufficient documentation

## 2012-06-10 DIAGNOSIS — Z1389 Encounter for screening for other disorder: Secondary | ICD-10-CM | POA: Insufficient documentation

## 2012-06-10 NOTE — Progress Notes (Signed)
Pamela Ford  was seen today for an ultrasound appointment.  See full report in [facility].  Impression: Single IUP at 23 4/7 weeks Normal interval anatomy - anatomic survey now complete No gross anomalies were noted Interval growth is appropriate (52nd %tile) Normal amniotic fluid volume  The left ovary appears somewhat enlarged (6 x 4 x 4 cm) but is homogenous without any difinitive cysts  Plan: Recommend follow up ultrasounds as clinically indicated   Alpha Gula, MD

## 2012-07-24 NOTE — L&D Delivery Note (Signed)
I have seen and examined this patient and I agree with the above. Cam Hai 8:57 AM 09/04/2012

## 2012-07-24 NOTE — L&D Delivery Note (Signed)
Delivery Note At 2:26 AM a viable female was delivered via Vaginal, Spontaneous Delivery (Presentation: Left Occiput Anterior).  APGAR: 5, 7; weight 6 lb 12.3 oz (3070 g).   Placenta status: Intact, Spontaneous sent for pathology.  Cord: 3 vessels with the following complications: None  Anesthesia: Local  Episiotomy: None Lacerations: 1st degree;Vaginal Suture Repair: 3.0 vicryl Est. Blood Loss (mL): 400  Mom to AICU for 24 hrs with Mg.  Baby to NICU for sepsis evaluation.     Twana First Isaiah Torok, DO of [facility] 09/04/2012, 3:03 AM

## 2012-08-29 ENCOUNTER — Encounter (HOSPITAL_COMMUNITY): Payer: Self-pay | Admitting: General Practice

## 2012-08-29 ENCOUNTER — Encounter: Payer: Self-pay | Admitting: Advanced Practice Midwife

## 2012-08-29 ENCOUNTER — Inpatient Hospital Stay (HOSPITAL_COMMUNITY)
Admission: AD | Admit: 2012-08-29 | Discharge: 2012-09-06 | DRG: 774 | Disposition: A | Discharge status: Home / Self Care | Payer: Medicaid Other | Source: Ambulatory Visit | Attending: Obstetrics & Gynecology | Admitting: Obstetrics & Gynecology

## 2012-08-29 ENCOUNTER — Observation Stay (HOSPITAL_COMMUNITY): Payer: Medicaid Other

## 2012-08-29 DIAGNOSIS — O169 Unspecified maternal hypertension, unspecified trimester: Secondary | ICD-10-CM | POA: Diagnosis present

## 2012-08-29 DIAGNOSIS — O99214 Obesity complicating childbirth: Secondary | ICD-10-CM | POA: Diagnosis present

## 2012-08-29 DIAGNOSIS — O99334 Smoking (tobacco) complicating childbirth: Secondary | ICD-10-CM | POA: Diagnosis present

## 2012-08-29 DIAGNOSIS — O411090 Infection of amniotic sac and membranes, unspecified, unspecified trimester, not applicable or unspecified: Secondary | ICD-10-CM | POA: Diagnosis present

## 2012-08-29 DIAGNOSIS — E669 Obesity, unspecified: Secondary | ICD-10-CM | POA: Diagnosis present

## 2012-08-29 DIAGNOSIS — O1414 Severe pre-eclampsia complicating childbirth: Principal | ICD-10-CM | POA: Diagnosis present

## 2012-08-29 DIAGNOSIS — IMO0001 Reserved for inherently not codable concepts without codable children: Secondary | ICD-10-CM

## 2012-08-29 DIAGNOSIS — O411290 Chorioamnionitis, unspecified trimester, not applicable or unspecified: Secondary | ICD-10-CM | POA: Diagnosis not present

## 2012-08-29 HISTORY — DX: Essential (primary) hypertension: I10

## 2012-08-29 LAB — COMPREHENSIVE METABOLIC PANEL
ALT: 18 U/L (ref 0–35)
AST: 26 U/L (ref 0–37)
Albumin: 2.6 g/dL — ABNORMAL LOW (ref 3.5–5.2)
Calcium: 9.7 mg/dL (ref 8.4–10.5)
GFR calc Af Amer: >90 mL/min (ref >90)
Glucose, Bld: 76 mg/dL (ref 70–99)
Potassium: 3.2 mEq/L — ABNORMAL LOW (ref 3.5–5.1)
Sodium: 139 mEq/L (ref 135–145)
Total Protein: 6.2 g/dL (ref 6.0–8.3)

## 2012-08-29 MED ORDER — LABETALOL HCL 5 MG/ML IV SOLN
20.0000 mg | INTRAVENOUS | Status: DC | PRN
  Administered 2012-08-29 – 2012-08-31 (×4): 20 mg via INTRAVENOUS
  Filled 2012-08-29 (×4): qty 4

## 2012-08-29 MED ORDER — SODIUM CHLORIDE 0.9 % IV SOLN: 250.0000 mL | INTRAVENOUS | Status: DC | PRN

## 2012-08-29 MED ORDER — PRENATAL MULTIVITAMIN CH
1.0000 | ORAL_TABLET | Freq: Every day | ORAL | Status: DC
  Administered 2012-08-30 – 2012-09-03 (×3): 1 via ORAL
  Filled 2012-08-29 (×3): qty 1

## 2012-08-29 MED ORDER — SODIUM CHLORIDE 0.9 % IJ SOLN: 3.0000 mL | INTRAMUSCULAR | Status: DC | PRN

## 2012-08-29 MED ORDER — PRENATAL MULTIVITAMIN CH: 1.0000 | ORAL_TABLET | Freq: Every day | ORAL | Status: DC

## 2012-08-29 MED ORDER — DOCUSATE SODIUM 100 MG PO CAPS
100.0000 mg | ORAL_CAPSULE | Freq: Every day | ORAL | Status: DC
  Administered 2012-08-30 – 2012-09-03 (×3): 100 mg via ORAL
  Filled 2012-08-29 (×3): qty 1

## 2012-08-29 MED ORDER — ZOLPIDEM TARTRATE 5 MG PO TABS
5.0000 mg | ORAL_TABLET | Freq: Every evening | ORAL | Status: DC | PRN
  Administered 2012-09-02: 5 mg via ORAL
  Filled 2012-08-29: qty 1

## 2012-08-29 MED ORDER — ZOLPIDEM TARTRATE 5 MG PO TABS: 5.0000 mg | ORAL_TABLET | Freq: Every evening | ORAL | Status: DC | PRN

## 2012-08-29 MED ORDER — CYCLOBENZAPRINE HCL 10 MG PO TABS
10.0000 mg | ORAL_TABLET | Freq: Three times a day (TID) | ORAL | Status: DC | PRN
  Administered 2012-08-29 – 2012-09-03 (×3): 10 mg via ORAL
  Filled 2012-08-29 (×5): qty 1

## 2012-08-29 MED ORDER — SODIUM CHLORIDE 0.9 % IJ SOLN
3.0000 mL | Freq: Two times a day (BID) | INTRAMUSCULAR | Status: DC
  Administered 2012-08-30 – 2012-09-03 (×4): 3 mL via INTRAVENOUS

## 2012-08-29 MED ORDER — CALCIUM CARBONATE ANTACID 500 MG PO CHEW
2.0000 | CHEWABLE_TABLET | ORAL | Status: DC | PRN
  Administered 2012-08-31: 400 mg via ORAL
  Filled 2012-08-29: qty 2

## 2012-08-29 MED ORDER — ACETAMINOPHEN 325 MG PO TABS: 650.0000 mg | ORAL_TABLET | ORAL | Status: DC | PRN

## 2012-08-29 MED ORDER — CALCIUM CARBONATE ANTACID 500 MG PO CHEW: 2.0000 | CHEWABLE_TABLET | ORAL | Status: DC | PRN

## 2012-08-29 MED ORDER — DOCUSATE SODIUM 100 MG PO CAPS: 100.0000 mg | ORAL_CAPSULE | Freq: Every day | ORAL | Status: DC

## 2012-08-29 NOTE — Progress Notes (Signed)
Plan of care discussed with Dr. Debroah Loop, who agrees.  Will collect pr/cr ratio and then start 24 hour urine.  Treat GHTN with IV labetolol prn.

## 2012-08-29 NOTE — H&P (Deleted)
Pamela Ford is a 27 y.o. G1P0 at [redacted]w[redacted]d admitted for Increased blood pressure in office.   Fetal presentation is cephalic.  History of Present Illness:  Pt was seen at [facility] today with blood pressure 140/100 and 168/106 on recheck. Was sent here for inpatient observation to r/o GHTN vs Preeclampsia  Patient reports the fetal movement as active.  Patient reports uterine contraction activity as none.  Patient reports vaginal bleeding as none.  Patient describes fluid per vagina as None.  Patient Active Problem List   Diagnosis   .  OBESITY   .  MORBID OBESITY   .  NICOTINE ADDICTION   .  GERD   .  DYSPEPSIA   .  ACUTE CYSTITIS   .  FATIGUE   .  NAUSEA WITH VOMITING   .  NAUSEA   .  ABDOMINAL PAIN   .  PELVIC PAIN    Past Medical History:  Past Medical History   Diagnosis  Date   .  Ovarian cyst    .  Acid reflux     Past Surgical History:  No past surgical history on file.  Obstetrical History:  OB History    Grav  Para  Term  Preterm  Abortions  TAB  SAB  Ect  Mult  Living    1              Pt had very little amniotic fluid at her 18 week ultrasound. ROM was ruled out, pt sent to MFM. Amniotic fluid had increased to low normal levels, and have been followed. Last u/s 1/24 showed AFI of 7.5cms with an EFW in the 49%. She is due for another f/u ultrasound  Gynecological History:  negative  Social History:  History    Social History   .  Marital Status:  Single     Spouse Name:  N/A     Number of Children:  N/A   .  Years of Education:  N/A    Social History Main Topics   .  Smoking status:  Former Smoker     Types:  Cigarettes   .  Smokeless tobacco:  Not on file   .  Alcohol Use:  No   .  Drug Use:  No   .  Sexually Active:     Other Topics  Concern   .  Not on file    Social History Narrative   .  No narrative on file    Family History:  No family history on file.  Allergies:  No Known Allergies   (Not in a hospital admission)  Review of  Systems - General ROS: negative  ENT ROS: negative for - headaches or visual changes  Respiratory ROS: no cough, shortness of breath, or wheezing  Musculoskeletal ROS: c/o Left sided back pain c/w musculoskeletal pain. No CVAT  Vitals: Last menstrual period 12/28/2011.  Physical Examination:  General appearance - alert, well appearing, and in no distress  Mental status - alert, oriented to person, place, and time  Eyes - pupils equal and reactive, extraocular eye movements intact  Chest - clear to auscultation, no wheezes, rales or rhonchi, symmetric air entry  Heart - normal rate and regular rhythm  Abdomen - soft, nontender, nondistended, no masses or organomegaly  Back exam - Left upper back sore with movement  Neurological - alert, oriented, normal speech, no focal findings or movement disorder noted, DTR's normal and symmetric  Extremities - pedal edema 2+ +  Abdomen: gravid and fundal height is size equals dates  Pelvic Exam:examination not indicated  Cervix: Not evaluated.  Membranes:intact  Fetal Monitoring:Baseline: 150 bpm avg LTV with + accels, no decels Labs:  No results found for this or any previous visit (from the past 24 hour(s)).  Imaging Studies:  No results found.  Filed Vitals:   08/29/12 2023  BP: 177/115  Pulse: 92  Temp:   Resp:      I have reviewed the patient's current medications.  ASSESSMENT:  Patient Active Problem List   Diagnosis   .  OBESITY   .  MORBID OBESITY   .  NICOTINE ADDICTION   .  GERD   .  DYSPEPSIA   .  ACUTE CYSTITIS   .  FATIGUE   .  NAUSEA WITH VOMITING   .  NAUSEA   .  ABDOMINAL PAIN   .  PELVIC PAIN   GHTN, severe range; r/o  Preeclampsia  Discussed with Dr. Debroah Loop who agrees with plan of care

## 2012-08-29 NOTE — H&P (Signed)
Pamela Ford is a 27 y.o. G1P0 at [redacted]w[redacted]d admitted for Increased blood pressure in office.    Fetal presentation is cephalic.  History of Present Illness: Pt was [facility] at [facility] today with blood pressure 140/100 and 168/106 on recheck.  Was sent here for inpatient observation to r/o GHTN vs Preeclampsia Patient reports the fetal movement as active. Patient reports uterine contraction  activity as none. Patient reports  vaginal bleeding as none. Patient describes fluid per vagina as None.  Patient Active Problem List  Diagnosis  . OBESITY  . MORBID OBESITY  . NICOTINE ADDICTION  . GERD  . DYSPEPSIA  . ACUTE CYSTITIS  . FATIGUE  . NAUSEA WITH VOMITING  . NAUSEA  . ABDOMINAL PAIN  . PELVIC  PAIN   Past Medical History: Past Medical History  Diagnosis Date  . Ovarian cyst   . Acid reflux     Past Surgical History: No past surgical history on file.  Obstetrical History: OB History    Grav Para Term Preterm Abortions TAB SAB Ect Mult Living   1             Pt had very little amniotic fluid at her 18 week ultrasound.  ROM was ruled out, pt sent to MFM.  Amniotic fluid had increased to low normal levels, and have been followed.  Last u/s 1/24 showed AFI of 7.5cms with an EFW in the 49%.  She is due for another f/u ultrasound Gynecological History: negative  Social History: History   Social History  . Marital Status: Single    Spouse Name: N/A    Number of Children: N/A  . Years of Education: N/A   Social History Main Topics  . Smoking status: Former Smoker    Types: Cigarettes  . Smokeless tobacco: Not on file  . Alcohol Use: No  . Drug Use: No  . Sexually Active:    Other Topics Concern  . Not on file   Social History Narrative  . No narrative on file    Family History: No family history on file.  Allergies: No Known Allergies   (Not in a hospital admission)  Review of Systems - General ROS: negative ENT ROS: negative for - headaches or  visual changes Respiratory ROS: no cough, shortness of breath, or wheezing Musculoskeletal ROS: c/o Left sided back pain c/w musculoskeletal pain.  No CVAT  Vitals:  Last menstrual period 12/28/2011. Physical Examination:  General appearance - alert, well appearing, and in no distress Mental status - alert, oriented to person, place, and time Eyes - pupils equal and reactive, extraocular eye movements intact Chest - clear to auscultation, no wheezes, rales or rhonchi, symmetric air entry Heart - normal rate and regular rhythm Abdomen - soft, nontender, nondistended, no masses or organomegaly Back exam - Left upper back sore with movement Neurological - alert, oriented, normal speech, no focal findings or movement disorder noted, DTR's normal and symmetric Extremities - pedal edema 2+  Abdomen: gravid and fundal height  is size equals dates Pelvic Exam:examination not indicated Cervix: Not evaluated.  Membranes:intact Fetal Monitoring:Baseline: 150 bpm, Variability: Good {> 6 bpm), Accelerations: Reactive and Decelerations: Absent   Labs:  No results found for this or any previous visit (from the past 24 hour(s)).  Imaging Studies: No results found.    177/112 and 173/115  I have reviewed the patient's current medications    ASSESSMENT: Patient Active Problem List  Diagnosis  . OBESITY  . MORBID OBESITY  .  NICOTINE ADDICTION  . GERD  . DYSPEPSIA  . ACUTE CYSTITIS  . FATIGUE  . NAUSEA WITH VOMITING  . NAUSEA  . ABDOMINAL PAIN  . PELVIC  PAIN   GHTN, severe range; r/o.vt Preeclampsia

## 2012-08-30 LAB — PROTEIN / CREATININE RATIO, URINE: Protein Creatinine Ratio: 0.27 — ABNORMAL HIGH (ref 0.00–0.15)

## 2012-08-30 LAB — TYPE AND SCREEN
ABO/RH(D): B POS
Antibody Screen: NEGATIVE

## 2012-08-30 LAB — CBC
HCT: 33.9 % — ABNORMAL LOW (ref 36.0–46.0)
Hemoglobin: 11.5 g/dL — ABNORMAL LOW (ref 12.0–15.0)
MCHC: 33.5 g/dL (ref 30.0–36.0)
MCHC: 33.9 g/dL (ref 30.0–36.0)
MCV: 91.1 fL (ref 78.0–100.0)
RDW: 14.1 % (ref 11.5–15.5)

## 2012-08-30 MED ORDER — LABETALOL HCL 200 MG PO TABS
200.0000 mg | ORAL_TABLET | Freq: Two times a day (BID) | ORAL | Status: DC
  Administered 2012-08-30 – 2012-09-05 (×13): 200 mg via ORAL
  Filled 2012-08-30 (×15): qty 1

## 2012-08-30 MED ORDER — LABETALOL HCL 200 MG PO TABS
200.0000 mg | ORAL_TABLET | Freq: Two times a day (BID) | ORAL | Status: DC
  Filled 2012-08-30 (×3): qty 1

## 2012-08-30 MED ORDER — LABETALOL HCL 200 MG PO TABS
200.0000 mg | ORAL_TABLET | Freq: Two times a day (BID) | ORAL | Status: DC
  Administered 2012-08-30: 200 mg via ORAL
  Filled 2012-08-30 (×2): qty 1

## 2012-08-30 NOTE — Progress Notes (Signed)
Patient ID: Pamela Ford, female   DOB: [birth year], 27 y.o.   MRN: [id number] FACULTY PRACTICE ANTEPARTUM(COMPREHENSIVE) NOTE  Pamela Ford is a 27 y.o. G1P0 at [redacted]w[redacted]d by LMP who is admitted for gestational hypertension, evaluate for preeclampsia .   Fetal presentation is cephalic. Length of Stay:  1  Days  Subjective: No headache of visual changes Patient reports the fetal movement as active. Patient reports uterine contraction  activity as none. Patient reports  vaginal bleeding as none. Patient describes fluid per vagina as None.  Vitals:  Blood pressure 137/81, pulse 92, temperature 98.5 F (36.9 C), temperature source Oral, resp. rate 20, height 5\' 3"  (1.6 m), weight 251 lb (113.853 kg), last menstrual period 12/28/2011. Physical Examination:  General appearance - alert, well appearing, and in no distress Heart - normal rate and regular rhythm Abdomen - soft, nontender, nondistended Fundal Height:  size equals dates Cervical Exam: Not evaluated.  Extremities: extremities normal, atraumatic, no cyanosis or edema and Homans sign is negative, no sign of DVT with DTRs 2+ bilaterally Membranes:intact  Fetal Monitoring:  Baseline: 145-150 bpm, Variability: Good {> 6 bpm) and Accelerations: Reactive  Labs:  Recent Results (from the past 24 hour(s))  COMPREHENSIVE METABOLIC PANEL   Collection Time   08/29/12  8:20 PM      Component Value Range   Sodium 139  135 - 145 mEq/L   Potassium 3.2 (*) 3.5 - 5.1 mEq/L   Chloride 103  96 - 112 mEq/L   CO2 25  19 - 32 mEq/L   Glucose, Bld 76  70 - 99 mg/dL   BUN 6  6 - 23 mg/dL   Creatinine, Ser 7.82  0.50 - 1.10 mg/dL   Calcium 9.7  8.4 - 95.6 mg/dL   Total Protein 6.2  6.0 - 8.3 g/dL   Albumin 2.6 (*) 3.5 - 5.2 g/dL   AST 26  0 - 37 U/L   ALT 18  0 - 35 U/L   Alkaline Phosphatase 148 (*) 39 - 117 U/L   Total Bilirubin 0.2 (*) 0.3 - 1.2 mg/dL   GFR calc non Af Amer >90  >90 mL/min   GFR calc Af Amer >90  >90 mL/min  CBC   Collection  Time   08/29/12  8:20 PM      Component Value Range   WBC 8.6  4.0 - 10.5 K/uL   RBC 3.78 (*) 3.87 - 5.11 MIL/uL   Hemoglobin 11.6 (*) 12.0 - 15.0 g/dL   HCT 21.3 (*) 08.6 - 57.8 %   MCV 91.5  78.0 - 100.0 fL   MCH 30.7  26.0 - 34.0 pg   MCHC 33.5  30.0 - 36.0 g/dL   RDW 46.9  62.9 - 52.8 %   Platelets 189  150 - 400 K/uL  TYPE AND SCREEN   Collection Time   08/29/12  8:20 PM      Component Value Range   ABO/RH(D) B POS     Antibody Screen NEG     Sample Expiration 09/01/2012    PROTEIN / CREATININE RATIO, URINE   Collection Time   08/29/12 10:30 PM      Component Value Range   Creatinine, Urine 24.76     Total Protein, Urine 6.7     PROTEIN CREATININE RATIO 0.27 (*) 0.00 - 0.15  ABO/RH   Collection Time   08/30/12  1:55 AM      Component Value Range   ABO/RH(D) B POS  CBC   Collection Time   08/30/12  5:48 AM      Component Value Range   WBC 7.6  4.0 - 10.5 K/uL   RBC 3.72 (*) 3.87 - 5.11 MIL/uL   Hemoglobin 11.5 (*) 12.0 - 15.0 g/dL   HCT 16.1 (*) 09.6 - 04.5 %   MCV 91.1  78.0 - 100.0 fL   MCH 30.9  26.0 - 34.0 pg   MCHC 33.9  30.0 - 36.0 g/dL   RDW 40.9  81.1 - 91.4 %   Platelets 174  150 - 400 K/uL    Imaging Studies:    Cephalic, AFI 10, EFW 65%ile  Medications:  Scheduled    . docusate sodium  100 mg Oral Daily  . prenatal multivitamin  1 tablet Oral Daily  . sodium chloride  3 mL Intravenous Q12H   I have reviewed the patient's current medications. CBC    Component Value Date/Time   WBC 7.6 08/30/2012 0548   RBC 3.72* 08/30/2012 0548   HGB 11.5* 08/30/2012 0548   HCT 33.9* 08/30/2012 0548   PLT 174 08/30/2012 0548   MCV 91.1 08/30/2012 0548   MCH 30.9 08/30/2012 0548   MCHC 33.9 08/30/2012 0548   RDW 14.0 08/30/2012 0548   LYMPHSABS 2.0 02/19/2012 1433   MONOABS 0.3 02/19/2012 1433   EOSABS 0.2 02/19/2012 1433   BASOSABS 0.0 02/19/2012 1433    CMP     Component Value Date/Time   NA 139 08/29/2012 2020   K 3.2* 08/29/2012 2020   CL 103 08/29/2012 2020   CO2 25  08/29/2012 2020   GLUCOSE 76 08/29/2012 2020   BUN 6 08/29/2012 2020   CREATININE 0.83 08/29/2012 2020   CALCIUM 9.7 08/29/2012 2020   PROT 6.2 08/29/2012 2020   ALBUMIN 2.6* 08/29/2012 2020   AST 26 08/29/2012 2020   ALT 18 08/29/2012 2020   ALKPHOS 148* 08/29/2012 2020   BILITOT 0.2* 08/29/2012 2020   GFRNONAA >90 08/29/2012 2020   GFRAA >90 08/29/2012 2020   Pr/Cr 0.27  ASSESSMENT: Patient Active Problem List  Diagnosis  . OBESITY  . MORBID OBESITY  . NICOTINE ADDICTION  . GERD  . DYSPEPSIA  . ACUTE CYSTITIS  . FATIGUE  . NAUSEA WITH VOMITING  . NAUSEA  . ABDOMINAL PAIN  . PELVIC  PAIN  . Hypertension complicating pregnancy    PLAN: 24 hr urine and observe for s/sx severe preeclampsia ARNOLD,JAMES 08/30/2012,6:51 AM

## 2012-08-31 ENCOUNTER — Encounter (HOSPITAL_COMMUNITY): Payer: Self-pay | Admitting: *Deleted

## 2012-08-31 LAB — CBC
Hemoglobin: 11.2 g/dL — ABNORMAL LOW (ref 12.0–15.0)
Hemoglobin: 11.5 g/dL — ABNORMAL LOW (ref 12.0–15.0)
MCH: 30.5 pg (ref 26.0–34.0)
Platelets: 179 10*3/uL (ref 150–400)
RBC: 3.67 MIL/uL — ABNORMAL LOW (ref 3.87–5.11)
RBC: 3.8 MIL/uL — ABNORMAL LOW (ref 3.87–5.11)
WBC: 8 10*3/uL (ref 4.0–10.5)
WBC: 7.8 10*3/uL (ref 4.0–10.5)

## 2012-08-31 LAB — RPR: RPR Ser Ql: NONREACTIVE

## 2012-08-31 LAB — COMPREHENSIVE METABOLIC PANEL
ALT: 17 U/L (ref 0–35)
AST: 23 U/L (ref 0–37)
Alkaline Phosphatase: 146 U/L — ABNORMAL HIGH (ref 39–117)
CO2: 22 mEq/L (ref 19–32)
Chloride: 105 mEq/L (ref 96–112)
GFR calc Af Amer: >90 mL/min (ref >90)
GFR calc non Af Amer: >90 mL/min (ref >90)
Glucose, Bld: 93 mg/dL (ref 70–99)
Potassium: 3.7 mEq/L (ref 3.5–5.1)
Sodium: 138 mEq/L (ref 135–145)
Total Bilirubin: 0.2 mg/dL — ABNORMAL LOW (ref 0.3–1.2)

## 2012-08-31 LAB — PROTEIN, URINE, 24 HOUR
Protein, Urine: 6 mg/dL
Urine Total Volume-UPROT: 6200 mL

## 2012-08-31 LAB — CREATININE CLEARANCE, URINE, 24 HOUR
Collection Interval-CRCL: 24 hours
Creatinine Clearance: 100 mL/min (ref 75–115)
Creatinine, 24H Ur: 1195 mg/d (ref 700–1800)
Creatinine, Urine: 19.28 mg/dL

## 2012-08-31 MED ORDER — LACTATED RINGERS IV SOLN: 500.0000 mL | Freq: Once | INTRAVENOUS | Status: DC

## 2012-08-31 MED ORDER — LABETALOL HCL 5 MG/ML IV SOLN
20.0000 mg | INTRAVENOUS | Status: DC | PRN
  Administered 2012-08-31 – 2012-09-04 (×5): 20 mg via INTRAVENOUS
  Filled 2012-08-31 (×6): qty 4

## 2012-08-31 MED ORDER — OXYCODONE-ACETAMINOPHEN 5-325 MG PO TABS: 1.0000 | ORAL_TABLET | ORAL | Status: DC | PRN

## 2012-08-31 MED ORDER — FENTANYL 2.5 MCG/ML BUPIVACAINE 1/10 % EPIDURAL INFUSION (WH - ANES): 14.0000 mL/h | INTRAMUSCULAR | Status: DC

## 2012-08-31 MED ORDER — EPHEDRINE 5 MG/ML INJ: 10.0000 mg | INTRAVENOUS | Status: DC | PRN

## 2012-08-31 MED ORDER — ONDANSETRON HCL 4 MG/2ML IJ SOLN
4.0000 mg | Freq: Four times a day (QID) | INTRAMUSCULAR | Status: DC | PRN
  Administered 2012-09-01: 4 mg via INTRAVENOUS
  Filled 2012-08-31: qty 2

## 2012-08-31 MED ORDER — IBUPROFEN 600 MG PO TABS: 600.0000 mg | ORAL_TABLET | Freq: Four times a day (QID) | ORAL | Status: DC | PRN

## 2012-08-31 MED ORDER — LACTATED RINGERS IV SOLN: 500.0000 mL | INTRAVENOUS | Status: DC | PRN

## 2012-08-31 MED ORDER — OXYTOCIN BOLUS FROM INFUSION: 500.0000 mL | INTRAVENOUS | Status: DC

## 2012-08-31 MED ORDER — LACTATED RINGERS IV SOLN
INTRAVENOUS | Status: DC
  Administered 2012-08-31: 07:00:00 via INTRAVENOUS
  Administered 2012-08-31: 100 mL/h via INTRAVENOUS
  Administered 2012-09-01 – 2012-09-02 (×3): via INTRAVENOUS

## 2012-08-31 MED ORDER — MAGNESIUM SULFATE BOLUS VIA INFUSION
4.0000 g | Freq: Once | INTRAVENOUS | Status: DC
  Administered 2012-08-31: 4 g via INTRAVENOUS
  Filled 2012-08-31: qty 500

## 2012-08-31 MED ORDER — MISOPROSTOL 25 MCG QUARTER TABLET
25.0000 ug | ORAL_TABLET | ORAL | Status: DC
  Administered 2012-08-31 – 2012-09-01 (×5): 25 ug via VAGINAL
  Filled 2012-08-31: qty 1
  Filled 2012-08-31 (×3): qty 0.25
  Filled 2012-08-31: qty 1
  Filled 2012-08-31: qty 0.25
  Filled 2012-08-31 (×3): qty 1
  Filled 2012-08-31: qty 0.25
  Filled 2012-08-31 (×2): qty 1
  Filled 2012-08-31: qty 0.25
  Filled 2012-08-31: qty 1

## 2012-08-31 MED ORDER — CITRIC ACID-SODIUM CITRATE 334-500 MG/5ML PO SOLN
30.0000 mL | ORAL | Status: DC | PRN
  Administered 2012-08-31 – 2012-09-02 (×3): 30 mL via ORAL
  Filled 2012-08-31 (×3): qty 15

## 2012-08-31 MED ORDER — PHENYLEPHRINE 40 MCG/ML (10ML) SYRINGE FOR IV PUSH (FOR BLOOD PRESSURE SUPPORT): 80.0000 ug | PREFILLED_SYRINGE | INTRAVENOUS | Status: DC | PRN

## 2012-08-31 MED ORDER — LIDOCAINE HCL (PF) 1 % IJ SOLN: 30.0000 mL | INTRAMUSCULAR | Status: DC | PRN

## 2012-08-31 MED ORDER — ACETAMINOPHEN 325 MG PO TABS: 650.0000 mg | ORAL_TABLET | ORAL | Status: DC | PRN

## 2012-08-31 MED ORDER — CITRIC ACID-SODIUM CITRATE 334-500 MG/5ML PO SOLN
ORAL | Status: AC
  Administered 2012-08-31: 30 mL via ORAL
  Filled 2012-08-31: qty 15

## 2012-08-31 MED ORDER — MAGNESIUM SULFATE 40 G IN LACTATED RINGERS - SIMPLE
2.0000 g/h | INTRAVENOUS | Status: DC
  Administered 2012-08-31 – 2012-09-02 (×3): 2 g/h via INTRAVENOUS
  Filled 2012-08-31 (×3): qty 500

## 2012-08-31 MED ORDER — OXYTOCIN 40 UNITS IN LACTATED RINGERS INFUSION - SIMPLE MED
62.5000 mL/h | INTRAVENOUS | Status: DC
  Filled 2012-08-31: qty 1000

## 2012-08-31 MED ORDER — DIPHENHYDRAMINE HCL 50 MG/ML IJ SOLN: 12.5000 mg | INTRAMUSCULAR | Status: DC | PRN

## 2012-08-31 NOTE — Progress Notes (Signed)
Pamela Ford is a 27 y.o. G1P0000 at [redacted]w[redacted]d admitted for IOL d/t pre-eclampsia  Subjective: Uncomfortable w/ uc's, requesting iv pain medicine. FOB supportive at bs.  Denies ha, scotomata, ruq/epigastric pain, n/v.    Objective: BP 140/88  Pulse 89  Temp(Src) 97.2 F (36.2 C) (Oral)  Resp 20  Ht 5\' 3"  (1.6 m)  Wt 113.853 kg (251 lb)  BMI 44.47 kg/m2  LMP 12/28/2011 I/O last 3 completed shifts: In: 4047.1 [P.O.:2520; I.V.:1527.1] Out: 6000 [Urine:6000]    FHT:  FHR: 145 bpm, variability: moderate,  accelerations:  Present,  decelerations:  Absent UC:   irregular, every 1-5 minutes SVE:   Outer os 1, unable to reach through to inner os/th/-3, very posterior  Labs: Lab Results  Component Value Date   WBC 7.8 08/31/2012   HGB 11.5* 08/31/2012   HCT 34.4* 08/31/2012   MCV 90.5 08/31/2012   PLT 179 08/31/2012    Assessment / Plan: IOL d/t pre-eclampsia, s/p 3 doses of cytotec, will continue cytotec until able to place cervical foley bulb  Labor: cervical ripening phase Preeclampsia:  on magnesium sulfate, no signs or symptoms of toxicity, intake and ouput balanced and labs stable Fetal Wellbeing:  Category I Pain Control:  requesting iv pain med I/D:  gbs culture still pending Anticipated MOD:  NSVD  [name] 08/31/2012, 8:19 PM

## 2012-08-31 NOTE — Progress Notes (Signed)
Pamela Ford is a 27 y.o. G1P0000 at [redacted]w[redacted]d admitted for induction of labor due to pre-eclampsia.  Subjective: Comfortable, no complaints.  Wants to eat.   Denies ha, scotomata, ruq/epigastric pain, n/v.    Objective: BP 145/95  Pulse 95  Temp(Src) 97.3 F (36.3 C) (Oral)  Resp 18  Ht 5\' 3"  (1.6 m)  Wt 113.853 kg (251 lb)  BMI 44.47 kg/m2  LMP 12/28/2011   Total I/O In: 1727.1 [P.O.:1200; I.V.:527.1] Out: 1000 [Urine:1000]  Temp:  [97.3 F (36.3 C)-98.5 F (36.9 C)] 97.3 F (36.3 C) (02/08 1102) Pulse Rate:  [89-107] 95 (02/08 1102) Resp:  [16-20] 18 (02/08 1102) BP: (136-176)/(84-111) 145/95 mmHg (02/08 1102)  Received 1 dose of labetalol 20mg  iv @ 1000  FHT:  FHR: 140 bpm, variability: moderate,  accelerations:  Present,  decelerations:  Absent UC:   Irregular, mild SVE:   Dilation: Closed Effacement (%): Thick Station: -3 Exam by:: R Simpson RN  Labs: Lab Results  Component Value Date   WBC 7.8 08/31/2012   HGB 11.5* 08/31/2012   HCT 34.4* 08/31/2012   MCV 90.5 08/31/2012   PLT 179 08/31/2012    Assessment / Plan: IOL d/t pre-eclampsia, s/p 1 dose of cytotec Light laboring diet while in cervical ripening phase  Labor: cervical ripening phase Preeclampsia:  on magnesium sulfate and no signs or symptoms of toxicity Fetal Wellbeing:  Category I Pain Control:  n/a I/D:  gbs pending, will initiate prophylaxis if active labor and culture hasn't returned, or returns pos Anticipated MOD:  NSVD  [name] 08/31/2012, 11:30 AM

## 2012-08-31 NOTE — Consult Note (Signed)
[facility]  Neonatal Medicine Consultation       08/31/2012    9:29 PM  I was called at the request of the patient's obstetrician (Dr. Debroah Loop) to speak to this patient due to impending preterm delivery at 35 1/2 weeks.  Mom admitted earlier today for induction of labor.  Pregnancy complicated by preeclampsia.  She is on magnesium sulfate.  I discussed the possibility of admission to the NICU due to prematurity and hypermagnesemia, but advised her that we would assess the baby boy at delivery before deciding if he will need admission there rather than to central nursery.  Other than a short period of feeding difficulty and diminished activity/tone from magnesium effect, we would expect the baby to do well and go home within a few days.  Ruben Gottron, MD  _____________________ Electronically Signed By: Angelita Ingles, MD Neonatologist

## 2012-08-31 NOTE — Progress Notes (Signed)
Patient ID: Pamela Ford, female   DOB: 06-01-1986, 27 y.o.   MRN: [id number]  S. No complaints today. Good FM.  O. SBP 160/110-150/100, requiring IVP labetolol last night as well as 2 doses of po labetolol  CVX- C/th/high DTR- 2+ 24 hour urine 372 mg protein  A/P. Pre eclampsia with severe range BPs. I will move her to L&D for magnesium and IOL NICU consult

## 2012-09-01 LAB — CBC
HCT: 34.2 % — ABNORMAL LOW (ref 36.0–46.0)
Hemoglobin: 11.3 g/dL — ABNORMAL LOW (ref 12.0–15.0)
RBC: 3.73 MIL/uL — ABNORMAL LOW (ref 3.87–5.11)
WBC: 8.4 10*3/uL (ref 4.0–10.5)

## 2012-09-01 LAB — CULTURE, BETA STREP (GROUP B ONLY)

## 2012-09-01 MED ORDER — FENTANYL CITRATE 0.05 MG/ML IJ SOLN
100.0000 ug | INTRAMUSCULAR | Status: DC | PRN
  Administered 2012-09-01 – 2012-09-03 (×4): 100 ug via INTRAVENOUS
  Filled 2012-09-01 (×5): qty 2

## 2012-09-01 MED ORDER — TERBUTALINE SULFATE 1 MG/ML IJ SOLN: 0.2500 mg | Freq: Once | INTRAMUSCULAR | Status: AC | PRN

## 2012-09-01 MED ORDER — OXYTOCIN 40 UNITS IN LACTATED RINGERS INFUSION - SIMPLE MED
1.0000 m[IU]/min | INTRAVENOUS | Status: DC
  Administered 2012-09-01: 5 m[IU]/min via INTRAVENOUS
  Administered 2012-09-01: 2 m[IU]/min via INTRAVENOUS

## 2012-09-01 NOTE — Progress Notes (Signed)
Pt assessment done at this time. Pt reports wanting to go to bathroom. When pt in bathroom she vomited copious amounts of particulate emesis. Pt cleaned up. Linens changed.

## 2012-09-01 NOTE — Progress Notes (Signed)
Pt has ambulated to BR several time. Foley bulb remains in place.

## 2012-09-01 NOTE — Progress Notes (Signed)
  Subjective: Pt doing well, has been OOB and ambulating, foley bulb still in place.   Objective: BP 128/81  Pulse 89  Temp(Src) 97.4 F (36.3 C) (Oral)  Resp 18  Ht 5\' 3"  (1.6 m)  Wt 113.853 kg (251 lb)  BMI 44.47 kg/m2  LMP 12/28/2011 I/O last 3 completed shifts: In: 7731.5 [P.O.:4700; I.V.:3031.5] Out: 65784 [Urine:13400] Total I/O In: 2235.4 [P.O.:1130; I.V.:1105.4] Out: 2000 [Urine:2000]  FHT:  FHR: 135 bpm, variability: moderate,  accelerations:  Present,  decelerations:  Absent UC:   irregular, every 5-10 minutes SVE:   Dilation: 1 Effacement (%): 20 Station: -3 Exam by:: K. Booker,RN  Labs: Lab Results  Component Value Date   WBC 8.4 09/01/2012   HGB 11.3* 09/01/2012   HCT 34.2* 09/01/2012   MCV 91.7 09/01/2012   PLT 171 09/01/2012    Assessment / Plan: Spontaneous labor, progressing normally  Labor: Progressing on Pitocin, will continue to increase then AROM, foley bulb in place Preeclampsia:  on magnesium sulfate, no signs or symptoms of toxicity, intake and ouput balanced and labs stable Fetal Wellbeing:  Category I Pain Control:  Epidural when Foley bulb falls out I/D:  n/a Anticipated MOD:  NSVD  Pamela Ford R. Paulina Fusi, DO of [facility] 09/01/2012, 3:33 PM

## 2012-09-01 NOTE — Progress Notes (Signed)
Foley bulb placed, orders received for pitocin per K. Booker,CNM

## 2012-09-01 NOTE — Progress Notes (Signed)
Nuns cap has been placed in BR twice, and found moved around and empty. When asked, pt states she has peed twice but it landed in the floor. Explained importance of collecting urine and encourage pt to try her best to void in white hat. Pt states she would try.

## 2012-09-01 NOTE — Progress Notes (Signed)
Laneice Meneely is a 27 y.o. G1P0000 at [redacted]w[redacted]d admitted for IOL d/t pre-eclampsia  Subjective: No complaints.  Feels only mild cramping. FOB at bedside. Denies ha, scotomata, ruq/epigastric pain, n/v.    Objective: BP 132/70  Pulse 100  Temp(Src) 98.2 F (36.8 C) (Oral)  Resp 18  Ht 5\' 3"  (1.6 m)  Wt 113.853 kg (251 lb)  BMI 44.47 kg/m2  LMP 12/28/2011 I/O last 3 completed shifts: In: 4047.1 [P.O.:2520; I.V.:1527.1] Out: 6000 [Urine:6000] Total I/O In: 2945 [P.O.:1820; I.V.:1125] Out: 6300 [Urine:6300]  FHT:  FHR: 135 bpm, variability: moderate,  accelerations:  Present,  decelerations:  Absent UC:   regular, every 1-3 minutes SVE:   1/20/-3 very posterior, but able to bring around enough w/ exam to get foley bulb in Cervical foley bulb inserted and inflated w/ 60ml LR w/o difficulty   Labs: Lab Results  Component Value Date   WBC 7.8 08/31/2012   HGB 11.5* 08/31/2012   HCT 34.4* 08/31/2012   MCV 90.5 08/31/2012   PLT 179 08/31/2012    Assessment / Plan: IOL d/t pre-eclampsia, s/p 5 doses of cytotec, now has cervical foley bulb in place. Will begin pitocin per protocol not to exceed 5mu/min while foley bulb in.   When foley bulb falls out, increase pitocin per protocol to achieve adequate uc pattern/dilation  Labor: cervical ripening phase Preeclampsia:  on magnesium sulfate and no signs or symptoms of toxicity Fetal Wellbeing:  Category I Pain Control:  n/a I/D:  gbs culture still pending.  Will initiate pcn per protocol if foley bulb falls out and culture not back Anticipated MOD:  NSVD  [name] 09/01/2012, 5:17 AM

## 2012-09-01 NOTE — Progress Notes (Addendum)
  Subjective: Pt doing well at this time, pain is less after getting medication, would like epidural when progressing.   Objective: BP 134/65  Pulse 82  Temp(Src) 98.7 F (37.1 C) (Oral)  Resp 22  Ht 5\' 3"  (1.6 m)  Wt 113.853 kg (251 lb)  BMI 44.47 kg/m2  LMP 12/28/2011 I/O last 3 completed shifts: In: 7731.5 [P.O.:4700; I.V.:3031.5] Out: 09811 [Urine:13400] Total I/O In: 621.8 [P.O.:360; I.V.:261.8] Out: 800 [Urine:800]  FHT:  FHR: 135 bpm, variability: moderate,  accelerations:  Present,  decelerations:  Absent UC:   irregular, every 5-10 minutes SVE:   Dilation: 1 Effacement (%): 20 Station: -3 Exam by:: K. Booker,RN  Labs: Lab Results  Component Value Date   WBC 8.4 09/01/2012   HGB 11.3* 09/01/2012   HCT 34.2* 09/01/2012   MCV 91.7 09/01/2012   PLT 171 09/01/2012    Assessment / Plan: Spontaneous labor, progressing normally  Labor: Progressing on Pitocin, will continue to increase then AROM, foley bulb in place Preeclampsia:  on magnesium sulfate, no signs or symptoms of toxicity, intake and ouput balanced and labs stable Fetal Wellbeing:  Category I Pain Control:  Epidural when Foley bulb falls out I/D:  n/a Anticipated MOD:  NSVD  Richard Holz R. Paulina Fusi, DO of [facility] 09/01/2012, 9:53 AM

## 2012-09-01 NOTE — Progress Notes (Signed)
Pamela Ford is a 27 y.o. G1P0000 at [redacted]w[redacted]d admitted for IOL d/t pre-eclampsia  Subjective: Feeling some mild cramping, denies pain Denies ha, scotomata, ruq/epigastric pain, n/v.    Objective: BP 147/98  Pulse 96  Temp(Src) 97.2 F (36.2 C) (Oral)  Resp 18  Ht 5\' 3"  (1.6 m)  Wt 113.853 kg (251 lb)  BMI 44.47 kg/m2  LMP 12/28/2011 I/O last 3 completed shifts: In: 4047.1 [P.O.:2520; I.V.:1527.1] Out: 6000 [Urine:6000] Total I/O In: 1705 [P.O.:1080; I.V.:625] Out: 3800 [Urine:3800]  FHT:  145, min-mod variability, 15x15accels, no decels=Cat II UC:   irregular, every 1-5 minutes SVE:   FT/20/-3 by RN  Labs: Lab Results  Component Value Date   WBC 7.8 08/31/2012   HGB 11.5* 08/31/2012   HCT 34.4* 08/31/2012   MCV 90.5 08/31/2012   PLT 179 08/31/2012    Assessment / Plan: IOL d/t pre-eclampsia, just received 5th dose of cytotec. Continue cytotec per protocol until able to place cervical foley bulb  Labor: cervical ripening phase Preeclampsia:  on magnesium sulfate and no signs or symptoms of toxicity Fetal Wellbeing:  Category II Pain Control:  n/a I/D:  gbs culture still pending Anticipated MOD:  NSVD  [name] 09/01/2012, 12:50 AM

## 2012-09-01 NOTE — Progress Notes (Signed)
Pamela Ford is a 27 y.o. G1P0000 at [redacted]w[redacted]d   Subjective: Comfortable for now and able to sleep off and on. Foley bulb still in.  Objective: BP 115/78  Pulse 95  Temp(Src) 98.6 F (37 C) (Oral)  Resp 18  Ht 5\' 3"  (1.6 m)  Wt 251 lb (113.853 kg)  BMI 44.47 kg/m2  LMP 12/28/2011 I/O last 3 completed shifts: In: 10596.9 [P.O.:6030; I.V.:4566.9] Out: 29562 [Urine:16200]    FHT:  FHR: 150 bpm, variability: moderate,  accelerations:  Present,  decelerations:  Absent UC:   none SVE:   Dilation: 1 Effacement (%): 20 Station: -3 Exam by:: K. Booker,RN  Labs: Lab Results  Component Value Date   WBC 8.4 09/01/2012   HGB 11.3* 09/01/2012   HCT 34.2* 09/01/2012   MCV 91.7 09/01/2012   PLT 171 09/01/2012    Assessment / Plan: Induction of labor due to preeclampsia,  progressing well on pitocin  Labor: Foley bulb still in Preeclampsia:  on magnesium sulfate Fetal Wellbeing:  Category I Pain Control:  Labor support without medications I/D:  n/a Anticipated MOD:  NSVD  [name] 09/01/2012, 8:48 PM

## 2012-09-02 DIAGNOSIS — O411090 Infection of amniotic sac and membranes, unspecified, unspecified trimester, not applicable or unspecified: Secondary | ICD-10-CM

## 2012-09-02 DIAGNOSIS — O1414 Severe pre-eclampsia complicating childbirth: Principal | ICD-10-CM

## 2012-09-02 LAB — TYPE AND SCREEN: Antibody Screen: NEGATIVE

## 2012-09-02 LAB — CBC
HCT: 33.5 % — ABNORMAL LOW (ref 36.0–46.0)
Hemoglobin: 11.3 g/dL — ABNORMAL LOW (ref 12.0–15.0)
MCHC: 33.7 g/dL (ref 30.0–36.0)
RBC: 3.67 MIL/uL — ABNORMAL LOW (ref 3.87–5.11)
WBC: 8.8 10*3/uL (ref 4.0–10.5)

## 2012-09-02 MED ORDER — ACETAMINOPHEN 325 MG PO TABS
650.0000 mg | ORAL_TABLET | ORAL | Status: DC | PRN
  Administered 2012-09-03: 650 mg via ORAL
  Filled 2012-09-02: qty 2

## 2012-09-02 MED ORDER — NALBUPHINE SYRINGE 5 MG/0.5 ML
10.0000 mg | INJECTION | Freq: Once | INTRAMUSCULAR | Status: AC
  Administered 2012-09-02: 10 mg via INTRAVENOUS
  Filled 2012-09-02: qty 1

## 2012-09-02 MED ORDER — LACTATED RINGERS IV SOLN: INTRAVENOUS | Status: DC

## 2012-09-02 MED ORDER — PROMETHAZINE HCL 25 MG/ML IJ SOLN
25.0000 mg | Freq: Once | INTRAMUSCULAR | Status: AC
  Administered 2012-09-02: 25 mg via INTRAVENOUS
  Filled 2012-09-02: qty 1

## 2012-09-02 NOTE — Progress Notes (Signed)
Patient ID: Vena Austria, female   DOB: Dec 11, 1985, 27 y.o.   MRN: [id number]  FACULTY PRACTICE ANTEPARTUM(COMPREHENSIVE) NOTE  Pamela Ford is a 27 y.o. G1P0000 at [redacted]w[redacted]d  who is admitted for preeclampsia.  Pt had some sever rnage pressures that led to induction.  After 2 days of unsuccessful attempt at inducing, pt has had nml BPs.  No signs, symptoms, lab abnormalities or fetal indication of sever preeclampsia.    Length of Stay:  4  Days  Subjective: Pt denies CP, SOB, headache, scotomata, RUQ pain. Patient reports the fetal movement as active. Patient reports uterine contraction  activity as none. Patient reports  vaginal bleeding as none. Patient describes fluid per vagina as None.  Vitals:  Blood pressure 142/85, pulse 84, temperature 98.2 F (36.8 C), temperature source Oral, resp. rate 20, height 5\' 3"  (1.6 m), weight 251 lb (113.853 kg), last menstrual period 12/28/2011. Physical Examination:  General appearance - alert, well appearing, and in no distress Abdomen - soft, nontender, nondistended, no masses or organomegaly Neurological - DTR's normal and symmetric Extremities - LE edema, no evidence of DVT  Fetal Monitoring:  Baseline: 145 bpm, Variability: Good {> 6 bpm), Accelerations: Reactive and Decelerations: Absent  Labs:  Recent Results (from the past 24 hour(s))  TYPE AND SCREEN   Collection Time    09/02/12  3:00 AM      Result Value Range   ABO/RH(D) B POS     Antibody Screen NEG     Sample Expiration 09/05/2012    CBC   Collection Time    09/02/12  3:00 AM      Result Value Range   WBC 8.8  4.0 - 10.5 K/uL   RBC 3.67 (*) 3.87 - 5.11 MIL/uL   Hemoglobin 11.3 (*) 12.0 - 15.0 g/dL   HCT 78.2 (*) 95.6 - 21.3 %   MCV 91.3  78.0 - 100.0 fL   MCH 30.8  26.0 - 34.0 pg   MCHC 33.7  30.0 - 36.0 g/dL   RDW 08.6  57.8 - 46.9 %   Platelets 161  150 - 400 K/uL    Imaging Studies:    none  Medications:  Scheduled . docusate sodium  100 mg Oral Daily  .  labetalol  200 mg Oral BID  . magnesium  4 g Intravenous Once  . prenatal multivitamin  1 tablet Oral Daily  . sodium chloride  3 mL Intravenous Q12H   I have reviewed the patient's current medications.  ASSESSMENT: Patient Active Problem List  Diagnosis  . OBESITY  . MORBID OBESITY  . NICOTINE ADDICTION  . GERD  . DYSPEPSIA  . ACUTE CYSTITIS  . FATIGUE  . NAUSEA WITH VOMITING  . NAUSEA  . ABDOMINAL PAIN  . PELVIC  PAIN  . Hypertension complicating pregnancy    PLAN: 27 yo G1P0 with preeclampsia.  Pt had some elevated BPsin 160s/100s which prpompted induction.  Since starting induction, pt has had nml to low BPs.  Pt has nml labs.   Given that pt is stable and no signs of sever preeclampsia, will place pt on antepartum and observe with daily labs, NST bid, BP monitoring.  If pt develops further signs of worsening preeclampsia then will proceed with delivery.  Discussed plan with Dr. Claudean Severance (MFM) who agrees.  Pt aware of plan and all questions answered.  [name] H. 09/02/2012,3:00 PM

## 2012-09-02 NOTE — H&P (Signed)
Pamela Ford is a 27 y.o. G1P0 at [redacted]w[redacted]d admitted for Increased blood pressure in office.   Fetal presentation is cephalic.  History of Present Illness:  Pt was seen at [facility] today with blood pressure 140/100 and 168/106 on recheck. Was sent here for inpatient observation to r/o GHTN vs Preeclampsia  Patient reports the fetal movement as active.  Patient reports uterine contraction activity as none.  Patient reports vaginal bleeding as none.  Patient describes fluid per vagina as None.  Patient Active Problem List   Diagnosis   .  OBESITY   .  MORBID OBESITY   .  NICOTINE ADDICTION   .  GERD   .  DYSPEPSIA   .  ACUTE CYSTITIS   .  FATIGUE   .  NAUSEA WITH VOMITING   .  NAUSEA   .  ABDOMINAL PAIN   .  PELVIC PAIN    Past Medical History:  Past Medical History   Diagnosis  Date   .  Ovarian cyst    .  Acid reflux     Past Surgical History:  No past surgical history on file.  Obstetrical History:  OB History    Grav  Para  Term  Preterm  Abortions  TAB  SAB  Ect  Mult  Living    1              Pt had very little amniotic fluid at her 18 week ultrasound. ROM was ruled out, pt sent to MFM. Amniotic fluid had increased to low normal levels, and have been followed. Last u/s 1/24 showed AFI of 7.5cms with an EFW in the 49%. She is due for another f/u ultrasound  Gynecological History:  negative  Social History:  History    Social History   .  Marital Status:  Single     Spouse Name:  N/A     Number of Children:  N/A   .  Years of Education:  N/A    Social History Main Topics   .  Smoking status:  Former Smoker     Types:  Cigarettes   .  Smokeless tobacco:  Not on file   .  Alcohol Use:  No   .  Drug Use:  No   .  Sexually Active:     Other Topics  Concern   .  Not on file    Social History Narrative   .  No narrative on file    Family History:  No family history on file.  Allergies:  No Known Allergies   (Not in a hospital admission)  Review of  Systems - General ROS: negative  ENT ROS: negative for - headaches or visual changes  Respiratory ROS: no cough, shortness of breath, or wheezing  Musculoskeletal ROS: c/o Left sided back pain c/w musculoskeletal pain. No CVAT  Vitals: Last menstrual period 12/28/2011.  Physical Examination:  General appearance - alert, well appearing, and in no distress  Mental status - alert, oriented to person, place, and time  Eyes - pupils equal and reactive, extraocular eye movements intact  Chest - clear to auscultation, no wheezes, rales or rhonchi, symmetric air entry  Heart - normal rate and regular rhythm  Abdomen - soft, nontender, nondistended, no masses or organomegaly  Back exam - Left upper back sore with movement  Neurological - alert, oriented, normal speech, no focal findings or movement disorder noted, DTR's normal and symmetric  Extremities - pedal edema 2+  Abdomen: gravid and fundal height is size equals dates  Pelvic Exam:examination not indicated  Cervix: Not evaluated.  Membranes:intact  Fetal Monitoring:Baseline: 150 bpm, Variability: Good {> 6 bpm), Accelerations: Reactive and Decelerations: Absent  Labs:  No results found for this or any previous visit (from the past 24 hour(s)).  Imaging Studies:  No results found.   177/112 and 173/115  I have reviewed the patient's current medications  ASSESSMENT:  Patient Active Problem List   Diagnosis   .  OBESITY   .  MORBID OBESITY   .  NICOTINE ADDICTION   .  GERD   .  DYSPEPSIA   .  ACUTE CYSTITIS   .  FATIGUE   .  NAUSEA WITH VOMITING   .  NAUSEA   .  ABDOMINAL PAIN   .  PELVIC PAIN    GHTN, severe range; r/o.vt Preeclampsia   Pt admitted to r/o preeclampsia.  BP control.   Pt admitted by Dr. Debroah Loop.

## 2012-09-02 NOTE — Progress Notes (Signed)
Pamela Ford is a 27 y.o. G1P0000 at [redacted]w[redacted]d  Subjective: Pt states contractions are more painful. Rating them 8/10, but only every 10 mins  Objective: BP 104/51  Pulse 88  Temp(Src) 98 F (36.7 C) (Oral)  Resp 18  Ht 5\' 3"  (1.6 m)  Wt 251 lb (113.853 kg)  BMI 44.47 kg/m2  LMP 12/28/2011 I/O last 3 completed shifts: In: 10596.9 [P.O.:6030; I.V.:4566.9] Out: 28413 [Urine:16200] Total I/O In: 1365.7 [P.O.:500; I.V.:865.7] Out: 2150 [Urine:2150]  FHT:  FHR: 135 bpm, variability: moderate,  accelerations:  Present,  decelerations:  Absent UC:   Every 10 mins per patient report. Unable to trace contractions with toco at this time SVE:  4/thick/high Labs: Lab Results  Component Value Date   WBC 8.4 09/01/2012   HGB 11.3* 09/01/2012   HCT 34.2* 09/01/2012   MCV 91.7 09/01/2012   PLT 171 09/01/2012    Assessment / Plan: Progressing with foley bulb out now. Will continue to increase pitocin until patient is more uncomfortable.   Labor: Will consider AROM if possible with next check. If we can AROM we will place IUPC.  Preeclampsia:  on magnesium sulfate Fetal Wellbeing:  Category I Pain Control:  Labor support without medications and Fentanyl I/D:  n/a Anticipated MOD:  NSVD  [name] 09/02/2012, 1:01 AM

## 2012-09-02 NOTE — Progress Notes (Addendum)
Pt. Transferred to antepartum room 159. Report given to Vernona Rieger, [place]

## 2012-09-02 NOTE — Progress Notes (Signed)
Delanda Bulluck is a 27 y.o. G1P0000 at [redacted]w[redacted]d   Subjective: No complaints Objective: BP 108/54  Pulse 88  Temp(Src) 98.5 F (36.9 C) (Oral)  Resp 18  Ht 5\' 3"  (1.6 m)  Wt 251 lb (113.853 kg)  BMI 44.47 kg/m2  LMP 12/28/2011 I/O last 3 completed shifts: In: 10596.9 [P.O.:6030; I.V.:4566.9] Out: 45409 [Urine:16200] Total I/O In: 1668.7 [P.O.:550; I.V.:1118.7] Out: 2750 [Urine:2750]  FHT:  FHR: 140 bpm, variability: moderate,  accelerations:  Present,  decelerations:  Absent UC:   Difficult to trace. About every 10 mins per patient SVE:   Dilation: 4 Effacement (%): Thick Station: -3 Exam by:: H. Hogan CNm  Labs: Lab Results  Component Value Date   WBC 8.4 09/01/2012   HGB 11.3* 09/01/2012   HCT 34.2* 09/01/2012   MCV 91.7 09/01/2012   PLT 171 09/01/2012    Assessment / Plan: IOL 2/2 Pre-eclampsia  Labor: Will stop pitocin at 0500 so patient can shower and have a light breaskfast Preeclampsia:  on magnesium sulfate Fetal Wellbeing:  Category I Pain Control:  Fentanyl I/D:  n/a Anticipated MOD:  NSVD  [name] 09/02/2012, 3:04 AM

## 2012-09-03 ENCOUNTER — Encounter (HOSPITAL_COMMUNITY): Payer: Self-pay | Admitting: Family Medicine

## 2012-09-03 DIAGNOSIS — O1414 Severe pre-eclampsia complicating childbirth: Secondary | ICD-10-CM | POA: Diagnosis present

## 2012-09-03 LAB — COMPREHENSIVE METABOLIC PANEL
AST: 21 U/L (ref 0–37)
Albumin: 2.1 g/dL — ABNORMAL LOW (ref 3.5–5.2)
Alkaline Phosphatase: 158 U/L — ABNORMAL HIGH (ref 39–117)
BUN: 9 mg/dL (ref 6–23)
CO2: 23 mEq/L (ref 19–32)
Calcium: 7.9 mg/dL — ABNORMAL LOW (ref 8.4–10.5)
Chloride: 99 mEq/L (ref 96–112)
Creatinine, Ser: 1.2 mg/dL — ABNORMAL HIGH (ref 0.50–1.10)
Creatinine, Ser: 1.28 mg/dL — ABNORMAL HIGH (ref 0.50–1.10)
GFR calc Af Amer: 73 mL/min — ABNORMAL LOW (ref >90)
GFR calc Af Amer: 67 mL/min — ABNORMAL LOW (ref >90)
GFR calc non Af Amer: 63 mL/min — ABNORMAL LOW (ref >90)
Glucose, Bld: 90 mg/dL (ref 70–99)
Glucose, Bld: 94 mg/dL (ref 70–99)
Potassium: 3.6 mEq/L (ref 3.5–5.1)
Total Bilirubin: 0.3 mg/dL (ref 0.3–1.2)
Total Protein: 6.1 g/dL (ref 6.0–8.3)

## 2012-09-03 LAB — CBC
HCT: 34.3 % — ABNORMAL LOW (ref 36.0–46.0)
Hemoglobin: 11.5 g/dL — ABNORMAL LOW (ref 12.0–15.0)
MCH: 30.4 pg (ref 26.0–34.0)
MCV: 90.7 fL (ref 78.0–100.0)
Platelets: 193 10*3/uL (ref 150–400)
RBC: 3.78 MIL/uL — ABNORMAL LOW (ref 3.87–5.11)
WBC: 9.4 10*3/uL (ref 4.0–10.5)

## 2012-09-03 MED ORDER — OXYTOCIN 40 UNITS IN LACTATED RINGERS INFUSION - SIMPLE MED: 1.0000 m[IU]/min | INTRAVENOUS | Status: DC

## 2012-09-03 MED ORDER — ACETAMINOPHEN 325 MG PO TABS: 650.0000 mg | ORAL_TABLET | ORAL | Status: DC | PRN

## 2012-09-03 MED ORDER — LACTATED RINGERS IV BOLUS (SEPSIS)
500.0000 mL | Freq: Once | INTRAVENOUS | Status: AC
  Administered 2012-09-03: 500 mL via INTRAVENOUS

## 2012-09-03 MED ORDER — ONDANSETRON HCL 4 MG/2ML IJ SOLN
4.0000 mg | Freq: Four times a day (QID) | INTRAMUSCULAR | Status: DC | PRN
  Administered 2012-09-04: 4 mg via INTRAVENOUS

## 2012-09-03 MED ORDER — ONDANSETRON HCL 4 MG PO TABS
4.0000 mg | ORAL_TABLET | Freq: Four times a day (QID) | ORAL | Status: DC | PRN
  Administered 2012-09-03: 4 mg via ORAL
  Filled 2012-09-03: qty 1

## 2012-09-03 MED ORDER — CITRIC ACID-SODIUM CITRATE 334-500 MG/5ML PO SOLN: 30.0000 mL | ORAL | Status: DC | PRN

## 2012-09-03 MED ORDER — SODIUM CHLORIDE 0.9 % IV SOLN
3.0000 g | Freq: Four times a day (QID) | INTRAVENOUS | Status: DC
  Administered 2012-09-03: 3 g via INTRAVENOUS
  Filled 2012-09-03 (×4): qty 3

## 2012-09-03 MED ORDER — HYDROXYZINE HCL 50 MG PO TABS: 50.0000 mg | ORAL_TABLET | Freq: Four times a day (QID) | ORAL | Status: DC | PRN

## 2012-09-03 MED ORDER — OXYTOCIN BOLUS FROM INFUSION
500.0000 mL | INTRAVENOUS | Status: DC
  Administered 2012-09-04: 500 mL via INTRAVENOUS

## 2012-09-03 MED ORDER — ONDANSETRON HCL 4 MG/2ML IJ SOLN
4.0000 mg | Freq: Three times a day (TID) | INTRAMUSCULAR | Status: DC | PRN
Start: 2012-09-03 — End: 2012-09-06
  Administered 2012-09-03: 4 mg via INTRAVENOUS
  Filled 2012-09-03 (×2): qty 2

## 2012-09-03 MED ORDER — MAGNESIUM SULFATE BOLUS VIA INFUSION
4.0000 g | Freq: Once | INTRAVENOUS | Status: AC
  Administered 2012-09-03: 4 g via INTRAVENOUS
  Filled 2012-09-03: qty 500

## 2012-09-03 MED ORDER — TERBUTALINE SULFATE 1 MG/ML IJ SOLN: 0.2500 mg | Freq: Once | INTRAMUSCULAR | Status: AC | PRN

## 2012-09-03 MED ORDER — LACTATED RINGERS IV SOLN
INTRAVENOUS | Status: DC
  Administered 2012-09-03 – 2012-09-04 (×2): via INTRAVENOUS

## 2012-09-03 MED ORDER — LACTATED RINGERS IV SOLN: 500.0000 mL | INTRAVENOUS | Status: DC | PRN

## 2012-09-03 MED ORDER — ONDANSETRON 4 MG PO TBDP
4.0000 mg | ORAL_TABLET | Freq: Four times a day (QID) | ORAL | Status: DC | PRN
  Filled 2012-09-03: qty 1

## 2012-09-03 MED ORDER — ACETAMINOPHEN 500 MG PO TABS
1000.0000 mg | ORAL_TABLET | Freq: Four times a day (QID) | ORAL | Status: DC | PRN
  Administered 2012-09-03: 1000 mg via ORAL
  Filled 2012-09-03: qty 2

## 2012-09-03 MED ORDER — OXYTOCIN 40 UNITS IN LACTATED RINGERS INFUSION - SIMPLE MED
62.5000 mL/h | INTRAVENOUS | Status: DC
  Filled 2012-09-03: qty 1000

## 2012-09-03 MED ORDER — MAGNESIUM SULFATE 40 G IN LACTATED RINGERS - SIMPLE
2.0000 g/h | INTRAVENOUS | Status: DC
  Filled 2012-09-03: qty 500

## 2012-09-03 MED ORDER — IBUPROFEN 600 MG PO TABS
600.0000 mg | ORAL_TABLET | Freq: Four times a day (QID) | ORAL | Status: DC | PRN
  Administered 2012-09-04: 600 mg via ORAL
  Filled 2012-09-03: qty 1

## 2012-09-03 MED ORDER — OXYCODONE-ACETAMINOPHEN 5-325 MG PO TABS
1.0000 | ORAL_TABLET | ORAL | Status: DC | PRN
  Administered 2012-09-04: 1 via ORAL
  Filled 2012-09-03: qty 1

## 2012-09-03 MED ORDER — HYDROXYZINE HCL 50 MG/ML IM SOLN: 50.0000 mg | Freq: Four times a day (QID) | INTRAMUSCULAR | Status: DC | PRN

## 2012-09-03 NOTE — Progress Notes (Signed)
Faculty Practice OB/GYN Attending Note  Subjective:  Called to evaluate patient with recent temperature of 100.4, nausea,emesis,  and contractions.  No LOF or vaginal bleeding. Good FM. Patient has received Tylenol and bolus of 500 ml of LR, recent temp is 98.9 F.     Objective:  Blood pressure 167/92, pulse 117, temperature 98.9 F (37.2 C), temperature source Oral, resp. rate 18, height 5\' 3"  (1.6 m), weight 264 lb 6.4 oz (119.931 kg), last menstrual period 12/28/2011. Temp:  [98.2 F (36.8 C)-100.4 F (38 C)] 98.9 F (37.2 C) (02/11 2019) Pulse Rate:  [96-117] 117 (02/11 2019) Resp:  [18-20] 18 (02/11 2019) BP: (138-167)/(50-93) 167/92 mmHg (02/11 2019) Weight:  [264 lb 6.4 oz (119.931 kg)] 264 lb 6.4 oz (119.931 kg) (02/11 1944)  FHT  Baseline 160 bpm, mild-moderate variability, no accelerations, no deceerations Toco:about every 5 minutes Gen: NAD Abdomen: NT gravid fundus, soft Cervix: 5.5/90/0/BBOW Ext: 2+ DTRs, no edema, no cyanosis, negative Homan's sign  Assessment & Plan:  27 y.o. G1P0000 at [redacted]w[redacted]d admitted for severe preeclampsia with halted induction of labor, now in labor.  Will transfer to L&D, augment with pitocin. Continue to monitor FHR tracing, may need FSE.  Will start treatment for chorioamnionitis given recent fever.  Continue close observation.  Jaynie Collins, MD, FACOG Attending Obstetrician & Gynecologist Faculty Practice, [facility] [place]

## 2012-09-03 NOTE — Progress Notes (Signed)
Declines efm at this time.  RN will call MD for orders for pt c/o nausea/vomiting

## 2012-09-03 NOTE — Progress Notes (Signed)
UR completed 

## 2012-09-03 NOTE — Progress Notes (Signed)
Subjective: Pt doing well at this point, not having increased abdominal discomfort.   Objective: BP 185/85  Pulse 115  Temp(Src) 99.3 F (37.4 C) (Oral)  Resp 20  Ht 5\' 3"  (1.6 m)  Wt 119.75 kg (264 lb)  BMI 46.78 kg/m2  SpO2 95%  LMP 12/28/2011 I/O last 3 completed shifts: In: 1113 [P.O.:360; I.V.:753] Out: 750 [Urine:750] Total I/O In: 376.3 [P.O.:120; I.V.:156.3; IV Piggyback:100] Out: 0   FHT:  FHR: 165 bpm, variability: minimal ,  accelerations:  Abscent,  decelerations:  Absent UC:   irregular, every 5 minutes SVE:   Dilation: 5.5 Effacement (%): 100 Station: 0 Exam by:: Dr. Lynetta Mare  Labs: Lab Results  Component Value Date   WBC 9.4 09/03/2012   HGB 11.5* 09/03/2012   HCT 34.3* 09/03/2012   MCV 90.7 09/03/2012   PLT 193 09/03/2012    Assessment / Plan: Spontaneous labor, progressing normally, will start Pitocin, IUPC placed   Labor: Progressing normally Preeclampsia:  on magnesium sulfate Fetal Wellbeing:  Category II Pain Control:  Labor support without medications I/D:  n/a Anticipated MOD:  NSVD  Pamela Ford R. Pamela Fusselman, DO of [facility] 09/04/2012, 12:01 AM

## 2012-09-03 NOTE — Progress Notes (Signed)
Patient ID: Vena Austria, female   DOB: Oct 30, 1985, 27 y.o.   MRN: [id number]  FACULTY PRACTICE ANTEPARTUM(COMPREHENSIVE) NOTE  Pamela Ford is a 27 y.o. G1P0000 at [redacted]w[redacted]d  who is admitted for preeclampsia.   Length of Stay:  5  Days  Subjective: Pt has back pain from be.  No contractions, headache, scotomata, RUQ pain Patient reports the fetal movement as active. Patient reports uterine contraction  activity as none. Patient reports  vaginal bleeding as none. Patient describes fluid per vagina as Other mucous and discharge.  Vitals:  Blood pressure 144/93, pulse 96, temperature 98.5 F (36.9 C), temperature source Oral, resp. rate 20, height 5\' 3"  (1.6 m), weight 251 lb (113.853 kg), last menstrual period 12/28/2011. Physical Examination:  General appearance - alert, well appearing, and in no distress Abdomen - soft, NT, ND, no RUQ pain Extremities - DTR 2+ , Homan's sign negative bilaterally, pedal edema unchanged  Fetal Monitoring:  Baseline: 150 bpm, Variability: Good {> 6 bpm), Accelerations: Reactive and Decelerations: Absent  Labs:  Labs pending this morning  Imaging Studies:    Will confirm vertex before proceeding with induction  Medications:  Scheduled . docusate sodium  100 mg Oral Daily  . labetalol  200 mg Oral BID  . magnesium  4 g Intravenous Once  . prenatal multivitamin  1 tablet Oral Daily  . sodium chloride  3 mL Intravenous Q12H   I have reviewed the patient's current medications.  ASSESSMENT: Patient Active Problem List  Diagnosis  . OBESITY  . MORBID OBESITY  . NICOTINE ADDICTION  . GERD  . DYSPEPSIA  . ACUTE CYSTITIS  . FATIGUE  . NAUSEA WITH VOMITING  . NAUSEA  . ABDOMINAL PAIN  . PELVIC  PAIN  . Hypertension complicating pregnancy    PLAN: 27 yo G1P0 with preeclampsia.  No signs of severe preeclampsia for past 3 days.  Highest BP 147/95.   Will do amniosure for discharge. Sponge bath if not ruptured.  If ruptured, will do bedside US ad  proceed with induction.  Pamela Ford H. 09/03/2012,6:26 AM

## 2012-09-03 NOTE — Progress Notes (Signed)
MD notified of FHR tracing and that RN is unable to trace UCs. MD states he will come assess the pt. Orders received to hold pitocin and to continue with expected management. Will continue to monitor.

## 2012-09-03 NOTE — Progress Notes (Signed)
Pt vomiting, brown/green emesis with small particles of food.  States she has not been feeling good today.

## 2012-09-03 NOTE — Progress Notes (Signed)
Patient was given 1000mg  of tylenol PO and vomited less than 10 mins afterwards.

## 2012-09-03 NOTE — Progress Notes (Signed)
MD notified of increased FHR baseline and minimal variability. Rn received order to start amp and continue expected management until FHR tracing is reactive. Will continue to monitor.

## 2012-09-04 ENCOUNTER — Encounter (HOSPITAL_COMMUNITY): Payer: Self-pay | Admitting: Family Medicine

## 2012-09-04 DIAGNOSIS — O411290 Chorioamnionitis, unspecified trimester, not applicable or unspecified: Secondary | ICD-10-CM | POA: Diagnosis not present

## 2012-09-04 DIAGNOSIS — O411090 Infection of amniotic sac and membranes, unspecified, unspecified trimester, not applicable or unspecified: Secondary | ICD-10-CM

## 2012-09-04 DIAGNOSIS — IMO0001 Reserved for inherently not codable concepts without codable children: Secondary | ICD-10-CM

## 2012-09-04 DIAGNOSIS — O1414 Severe pre-eclampsia complicating childbirth: Secondary | ICD-10-CM

## 2012-09-04 LAB — CBC
HCT: 34.3 % — ABNORMAL LOW (ref 36.0–46.0)
Hemoglobin: 11.5 g/dL — ABNORMAL LOW (ref 12.0–15.0)
MCH: 30.5 pg (ref 26.0–34.0)
MCHC: 33.5 g/dL (ref 30.0–36.0)
MCV: 91 fL (ref 78.0–100.0)
Platelets: 188 10*3/uL (ref 150–400)
RBC: 3.39 MIL/uL — ABNORMAL LOW (ref 3.87–5.11)
RDW: 13.8 % (ref 11.5–15.5)
WBC: 25.6 10*3/uL — ABNORMAL HIGH (ref 4.0–10.5)

## 2012-09-04 LAB — MRSA PCR SCREENING: MRSA by PCR: NEGATIVE

## 2012-09-04 MED ORDER — BENZOCAINE-MENTHOL 20-0.5 % EX AERO
1.0000 "application " | INHALATION_SPRAY | CUTANEOUS | Status: DC | PRN
  Administered 2012-09-05: 1 via TOPICAL
  Filled 2012-09-04: qty 56

## 2012-09-04 MED ORDER — SODIUM CHLORIDE 0.9 % IV SOLN
3.0000 g | Freq: Four times a day (QID) | INTRAVENOUS | Status: AC
  Administered 2012-09-04 – 2012-09-05 (×4): 3 g via INTRAVENOUS
  Filled 2012-09-04 (×6): qty 3

## 2012-09-04 MED ORDER — ONDANSETRON HCL 4 MG PO TABS: 4.0000 mg | ORAL_TABLET | ORAL | Status: DC | PRN

## 2012-09-04 MED ORDER — SIMETHICONE 80 MG PO CHEW
80.0000 mg | CHEWABLE_TABLET | ORAL | Status: DC | PRN
  Administered 2012-09-04: 80 mg via ORAL

## 2012-09-04 MED ORDER — IBUPROFEN 600 MG PO TABS
600.0000 mg | ORAL_TABLET | Freq: Four times a day (QID) | ORAL | Status: DC
  Administered 2012-09-04 – 2012-09-06 (×8): 600 mg via ORAL
  Filled 2012-09-04 (×8): qty 1

## 2012-09-04 MED ORDER — ZOLPIDEM TARTRATE 5 MG PO TABS: 5.0000 mg | ORAL_TABLET | Freq: Every evening | ORAL | Status: DC | PRN

## 2012-09-04 MED ORDER — ACETAMINOPHEN 500 MG PO TABS
1000.0000 mg | ORAL_TABLET | Freq: Once | ORAL | Status: AC
  Administered 2012-09-04: 1000 mg via ORAL
  Filled 2012-09-04 (×2): qty 1

## 2012-09-04 MED ORDER — DIBUCAINE 1 % RE OINT: 1.0000 "application " | TOPICAL_OINTMENT | RECTAL | Status: DC | PRN

## 2012-09-04 MED ORDER — LIDOCAINE HCL (PF) 1 % IJ SOLN
INTRAMUSCULAR | Status: AC
  Administered 2012-09-04: 30 mL
  Filled 2012-09-04: qty 30

## 2012-09-04 MED ORDER — TETANUS-DIPHTH-ACELL PERTUSSIS 5-2.5-18.5 LF-MCG/0.5 IM SUSP
0.5000 mL | Freq: Once | INTRAMUSCULAR | Status: AC
  Administered 2012-09-05: 0.5 mL via INTRAMUSCULAR
  Filled 2012-09-04: qty 0.5

## 2012-09-04 MED ORDER — WITCH HAZEL-GLYCERIN EX PADS: 1.0000 "application " | MEDICATED_PAD | CUTANEOUS | Status: DC | PRN

## 2012-09-04 MED ORDER — ONDANSETRON HCL 4 MG/2ML IJ SOLN: 4.0000 mg | INTRAMUSCULAR | Status: DC | PRN

## 2012-09-04 MED ORDER — PRENATAL MULTIVITAMIN CH
1.0000 | ORAL_TABLET | Freq: Every day | ORAL | Status: DC
  Administered 2012-09-04 – 2012-09-05 (×2): 1 via ORAL
  Filled 2012-09-04 (×2): qty 1

## 2012-09-04 MED ORDER — MAGNESIUM SULFATE 40 G IN LACTATED RINGERS - SIMPLE
2.0000 g/h | INTRAVENOUS | Status: AC
  Administered 2012-09-04: 2 g/h via INTRAVENOUS
  Filled 2012-09-04 (×2): qty 500

## 2012-09-04 MED ORDER — SENNOSIDES-DOCUSATE SODIUM 8.6-50 MG PO TABS
2.0000 | ORAL_TABLET | Freq: Every day | ORAL | Status: DC
  Administered 2012-09-04 – 2012-09-05 (×2): 2 via ORAL

## 2012-09-04 MED ORDER — LACTATED RINGERS IV SOLN
INTRAVENOUS | Status: DC
  Administered 2012-09-04 (×2): via INTRAVENOUS

## 2012-09-04 MED ORDER — LANOLIN HYDROUS EX OINT: TOPICAL_OINTMENT | CUTANEOUS | Status: DC | PRN

## 2012-09-04 MED ORDER — DIPHENHYDRAMINE HCL 25 MG PO CAPS: 25.0000 mg | ORAL_CAPSULE | Freq: Four times a day (QID) | ORAL | Status: DC | PRN

## 2012-09-04 MED ORDER — OXYCODONE-ACETAMINOPHEN 5-325 MG PO TABS
1.0000 | ORAL_TABLET | ORAL | Status: DC | PRN
  Administered 2012-09-05: 1 via ORAL
  Filled 2012-09-04: qty 1

## 2012-09-04 MED ORDER — PANTOPRAZOLE SODIUM 40 MG PO TBEC
40.0000 mg | DELAYED_RELEASE_TABLET | Freq: Every day | ORAL | Status: DC
  Administered 2012-09-04 – 2012-09-05 (×2): 40 mg via ORAL
  Filled 2012-09-04 (×4): qty 1

## 2012-09-04 NOTE — Progress Notes (Signed)
MD notified of FHR, decelerations, RN interventions, and possible ROM. Will continue to monitor.

## 2012-09-04 NOTE — Progress Notes (Signed)
UR chart review completed.  

## 2012-09-04 NOTE — Progress Notes (Signed)
MD on the phone with CNM. CNM notified of FHR tracing, IUPC placement and baseline, and SVE. Will continue to monitor.

## 2012-09-05 NOTE — Progress Notes (Signed)
Post Partum Day 1 Subjective: no complaints, up ad lib, voiding and tolerating PO  Objective: Blood pressure 127/79, pulse 81, temperature 97.8 F (36.6 C), temperature source Oral, resp. rate 18, height 5\' 3"  (1.6 m), weight 117.527 kg (259 lb 1.6 oz), last menstrual period 12/28/2011, SpO2 100.00%, unknown if currently breastfeeding.  Physical Exam:  General: alert, cooperative and no distress Lochia: appropriate Uterine Fundus: firm Incision:  DVT Evaluation: No evidence of DVT seen on physical exam.   Recent Labs  09/04/12 0004 09/04/12 0445  HGB 11.5* 10.4*  HCT 34.3* 31.1*  white blood cell count Wbc 25K  Assessment/Plan: Postpartum day s/p Mag x 24 hours for pre-E S/p chorio Plan : routine pp care, transfer to floor from ICU,             Recheck CBC in am    LOS: 7 days   Denis Carreon V 09/05/2012, 9:34 AM

## 2012-09-06 LAB — CBC
Hemoglobin: 8.9 g/dL — ABNORMAL LOW (ref 12.0–15.0)
RBC: 2.92 MIL/uL — ABNORMAL LOW (ref 3.87–5.11)

## 2012-09-06 MED ORDER — OXYCODONE-ACETAMINOPHEN 5-325 MG PO TABS: 1.0000 | ORAL_TABLET | ORAL | Status: DC | PRN

## 2012-09-06 MED ORDER — HYDROCHLOROTHIAZIDE 12.5 MG PO TABS: 12.5000 mg | ORAL_TABLET | Freq: Every day | ORAL | Status: DC

## 2012-09-06 MED ORDER — HYDROCHLOROTHIAZIDE 25 MG PO TABS
12.5000 mg | ORAL_TABLET | Freq: Every day | ORAL | Status: DC
  Filled 2012-09-06: qty 0.5

## 2012-09-06 MED ORDER — DOCUSATE SODIUM 100 MG PO CAPS: 100.0000 mg | ORAL_CAPSULE | Freq: Two times a day (BID) | ORAL | Status: DC | PRN

## 2012-09-06 MED ORDER — IBUPROFEN 600 MG PO TABS: 600.0000 mg | ORAL_TABLET | Freq: Four times a day (QID) | ORAL | Status: DC

## 2012-09-06 NOTE — Discharge Summary (Signed)
Obstetric Discharge Summary Pamela Ford is a 27 y.o. G1P0101 presenting at 35 weeks for preeclampsia work-up. She was diagnosed with severe preeclampsia and induction of labor was attempted. After 3 days, induction was stopped. Patient then went into spontaneous labor on her own and progressed to complete dilation with SVD. She was on magnesium sulfate infusion during labor and for 24 hours postpartum. She also developed chorioamnionitis and was treated with Unasyn for 24 hours. By day of discharge, her blood pressures were well controlled and she was afebrile for more than 48 hours. Baby continues in NICU and patient is bottle feeding. She plans to use Depo Provera for contraception.  Reason for Admission: Preeclampsia, induction of labor Prenatal Procedures: NST and Preeclampsia Intrapartum Procedures: spontaneous vaginal delivery Postpartum Procedures: antibiotics, magnesium sulfate Complications-Operative and Postpartum: chorioamnionitis Hemoglobin  Date Value Range Status  09/04/2012 10.4* 12.0 - 15.0 g/dL Final     HCT  Date Value Range Status  09/04/2012 31.1* 36.0 - 46.0 % Final    Physical Exam:  General: alert, cooperative, no distress and morbidly obese Lochia: appropriate Uterine Fundus: firm Incision: n/a DVT Evaluation: No evidence of DVT seen on physical exam. Negative Homan's sign. No cords or calf tenderness. No significant calf/ankle edema.  Discharge Diagnoses: Severe preclampsia, preterm spontaneous vaginal delivery, chorioamnionitis  Discharge Information: Date: 09/06/2012 Activity: pelvic rest Diet: routine Medications: PNV, Ibuprofen, Colace, Percocet and HCTZ 12.5 mg Condition: stable Instructions: refer to practice specific booklet Discharge to: home Follow-up Information   Follow up with [facility]. Call in 1 week. (Follow up at [facility] next week for blood pressure check and Depo shot)    Contact information:   [address] [place] [address] [phone]      Newborn Data: Live born female  Birth Weight: 6 lb 12.3 oz (3070 g) APGAR: 5, 7  Baby in NICU.  Napoleon Form 09/06/2012, 7:10 AM

## 2012-09-06 NOTE — Discharge Summary (Signed)
Attestation of Attending Supervision of Obstetric Fellow: Evaluation and management procedures were performed by the Obstetric Fellow under my supervision and collaboration.  I have reviewed the Obstetric Fellow's note and chart, and I agree with the management and plan.  Virgin Zellers, MD, FACOG Attending Obstetrician & Gynecologist Faculty Practice, [facility]   

## 2012-09-07 NOTE — Progress Notes (Signed)
Clinical Social Work Department PSYCHOSOCIAL ASSESSMENT - MATERNAL/CHILD 09/06/2012  Patient:  Pamela Ford,Pamela Ford  Account Number:  [id number]  Admit Date:  09/04/2012  Childs Name:   Pamela Ford    Clinical Social Worker:  Traylen Eckels, LCSW   Date/Time:  09/06/2012 10:40 AM  Date Referred:  09/06/2012   Referral source  NICU     Referred reason  NICU   Other referral source:    I:  FAMILY / HOME ENVIRONMENT Child's legal guardian:  PARENT  Guardian - Name Guardian - Age Guardian - Address  Cadince Marsee [address] [place], [place] [address]  Francis Ford 37 same   Other household support members/support persons Other support:   Parents state they have good family support in [place].  MOB listed a brother as a main support person.    II  PSYCHOSOCIAL DATA Information Source:  Family Interview  Financial and Community Resources Employment:   FOB states he will start a new job at [employer] next week.  MOB plans to look for a job in the near future.   Financial resources:  Medicaid If Medicaid - County:  [place]  School / Grade:   Maternity Care Coordinator / Child Services Coordination / Early Interventions:   CC4C  Cultural issues impacting care:   None indicated    III  STRENGTHS Strengths  Adequate Resources  Compliance with medical plan  Other - See comment  Supportive family/friends   Strength comment:  CSW provided pediatrician list.   IV  RISK FACTORS AND CURRENT PROBLEMS Current Problem:  YES   Risk Factor & Current Problem Patient Issue Family Issue Risk Factor / Current Problem Comment  Knowledge/Cognitive Deficit Y Y     V  SOCIAL WORK ASSESSMENT CSW met with parents at baby's bedside to introduce myself, complete assessment and evaluate how they are coping with baby's admission to NICU.  Parents were quiet, but friendly.  They report being happy about baby.  MOB was holding baby lovingly and bonding seems to be evident. They  state they have some supplies for baby at home, but do not have a bed or a car seat.  CSW made a referral to [facility], who provided them with a pack n play.  CSW explained that CSW nor FSN have access to free car seats, but that parents may call [facility] at the hospital if they are unable to get one on their own and that they cost $30.00.  Parents state they have no way of getting home to [place] today after MOB's discharge.  CSW brainstormed with them about natural supports since CSW has no way of getting them to [place].  CSW offered gas cards to provide to anyone available to come get them.  They accepted and stated that they should be able to get a family member to come get them.  (Later CSW received call from [facility] staff that parents were asking for a taxi voucher because they had unsuccessfully tried to hire a cab with the gas cards given to them by CSW.)  CSW is not able to give a taxi voucher to get parents to [place] since they have friends and family with transportation. CSW asked how they plan to visit baby and FOB said transportation by friends and family will not be a problem. Parents have not chosen a pediatrician so CSW gave them a list.  CSW explained support services offered by NICU CSW and gave contact information.  CSW discussed   emotional health and signs and symptoms of PPD.  MOB states she feels comfortable calling her doctor's office if symptoms arise. She states no concerns at this time.  CSW is concerned that parents may have cognitive limitations.  Nothing is documented in MOB's chart regarding this.  CSW spoke to bedside RN regarding concerns, which bedside RN agreed with.  CSW requests that RN monitor closely to provide support and documentation of how parents are doing with basic care and understanding of medical situation.      VI SOCIAL WORK PLAN Social Work Plan  Psychosocial Support/Ongoing Assessment of Needs   Type of pt/family education:    PPD signs and symptoms   If child protective services report - county:   If child protective services report - date:   Information/referral to community resources comment:   CC4C   Other social work plan:      

## 2012-10-15 ENCOUNTER — Ambulatory Visit: Payer: Self-pay | Admitting: Obstetrics & Gynecology

## 2012-10-17 ENCOUNTER — Ambulatory Visit: Payer: Self-pay | Admitting: Obstetrics & Gynecology

## 2012-10-23 ENCOUNTER — Ambulatory Visit: Payer: Self-pay | Admitting: Obstetrics & Gynecology

## 2012-10-31 ENCOUNTER — Ambulatory Visit: Payer: Self-pay | Admitting: Obstetrics & Gynecology

## 2012-12-18 ENCOUNTER — Encounter (HOSPITAL_COMMUNITY): Payer: Self-pay | Admitting: *Deleted

## 2012-12-18 ENCOUNTER — Emergency Department (HOSPITAL_COMMUNITY)
Admission: EM | Admit: 2012-12-18 | Discharge: 2012-12-18 | Disposition: A | Discharge status: Home / Self Care | Payer: Medicaid - Out of State | Attending: Emergency Medicine | Admitting: Emergency Medicine

## 2012-12-18 DIAGNOSIS — I10 Essential (primary) hypertension: Secondary | ICD-10-CM | POA: Insufficient documentation

## 2012-12-18 DIAGNOSIS — F3289 Other specified depressive episodes: Secondary | ICD-10-CM | POA: Insufficient documentation

## 2012-12-18 DIAGNOSIS — F17200 Nicotine dependence, unspecified, uncomplicated: Secondary | ICD-10-CM | POA: Insufficient documentation

## 2012-12-18 DIAGNOSIS — I83893 Varicose veins of bilateral lower extremities with other complications: Secondary | ICD-10-CM | POA: Insufficient documentation

## 2012-12-18 DIAGNOSIS — Z8742 Personal history of other diseases of the female genital tract: Secondary | ICD-10-CM | POA: Insufficient documentation

## 2012-12-18 DIAGNOSIS — F329 Major depressive disorder, single episode, unspecified: Secondary | ICD-10-CM | POA: Insufficient documentation

## 2012-12-18 DIAGNOSIS — Z8719 Personal history of other diseases of the digestive system: Secondary | ICD-10-CM | POA: Insufficient documentation

## 2012-12-18 DIAGNOSIS — E669 Obesity, unspecified: Secondary | ICD-10-CM | POA: Insufficient documentation

## 2012-12-18 DIAGNOSIS — Z79899 Other long term (current) drug therapy: Secondary | ICD-10-CM | POA: Insufficient documentation

## 2012-12-18 DIAGNOSIS — I83813 Varicose veins of bilateral lower extremities with pain: Secondary | ICD-10-CM

## 2012-12-18 DIAGNOSIS — F39 Unspecified mood [affective] disorder: Secondary | ICD-10-CM | POA: Insufficient documentation

## 2012-12-18 LAB — RAPID URINE DRUG SCREEN, HOSP PERFORMED
Barbiturates: NOT DETECTED
Cocaine: NOT DETECTED
Tetrahydrocannabinol: NOT DETECTED

## 2012-12-18 LAB — CBC
Hemoglobin: 13.3 g/dL (ref 12.0–15.0)
MCH: 31.1 pg (ref 26.0–34.0)
RBC: 4.27 MIL/uL (ref 3.87–5.11)
WBC: 6.5 10*3/uL (ref 4.0–10.5)

## 2012-12-18 LAB — URINALYSIS, ROUTINE W REFLEX MICROSCOPIC
Glucose, UA: NEGATIVE mg/dL
Ketones, ur: NEGATIVE mg/dL
Specific Gravity, Urine: <1.005 — ABNORMAL LOW (ref 1.005–1.030)
pH: 6 (ref 5.0–8.0)

## 2012-12-18 LAB — URINE MICROSCOPIC-ADD ON

## 2012-12-18 LAB — BASIC METABOLIC PANEL
CO2: 28 mEq/L (ref 19–32)
GFR calc non Af Amer: 86 mL/min — ABNORMAL LOW (ref >90)
Glucose, Bld: 95 mg/dL (ref 70–99)
Potassium: 3.9 mEq/L (ref 3.5–5.1)
Sodium: 140 mEq/L (ref 135–145)

## 2012-12-18 MED ORDER — FLUOXETINE HCL 20 MG PO TABS: 20.0000 mg | ORAL_TABLET | Freq: Every day | ORAL | Status: DC

## 2012-12-18 MED ORDER — ACETAMINOPHEN-CODEINE #3 300-30 MG PO TABS: 1.0000 | ORAL_TABLET | Freq: Four times a day (QID) | ORAL | Status: DC | PRN

## 2012-12-18 MED ORDER — ACETAMINOPHEN-CODEINE #3 300-30 MG PO TABS
1.0000 | ORAL_TABLET | Freq: Once | ORAL | Status: AC
  Administered 2012-12-18: 1 via ORAL
  Filled 2012-12-18: qty 1

## 2012-12-18 NOTE — ED Notes (Signed)
Pt denies hx of same. States her first [facility] appt was for earlier today but she missed it because her son's case worker was at the house. NAD

## 2012-12-18 NOTE — ED Notes (Signed)
Bilateral leg pain, which has been ongoing x 6 years. Pt also states she would like to talk to someone about depression because she has been depressed lately. Depressed for ~6 mo. Denies SI today.

## 2012-12-18 NOTE — ED Provider Notes (Signed)
History     CSN: [id number]  Arrival date & time 12/18/12  1402   First MD Initiated Contact with Patient 12/18/12 1839      Chief Complaint  Patient presents with  . Pain  . V70.1    (Consider location/radiation/quality/duration/timing/severity/associated sxs/prior treatment) Patient is a 27 y.o. female presenting with mental health disorder. The history is provided by the patient and medical records. No language interpreter was used.  Mental Health Problem Presenting symptoms: depression   Presenting symptoms: no suicidal thoughts   Presenting symptoms comment:  Patient is a 27 year old woman who says that she has been depressed for something like 6 months. This preceded her last in symmetrical delivery. He said she is not suicidal at present, but had felt some suicidality about 2 months ago. She had missed an appointment to be seen at [facility] today about this issue. She also notes pain in her legs when she stands for file. She notices she has flat feet. This problem has gotten worse since her last obstetrical delivery several months ago. Patient accompanied by:  Spouse Degree of incapacity (severity):  Moderate Onset quality:  Gradual Duration:  6 months Timing:  Constant Progression:  Unchanged Chronicity:  Chronic Context: not alcohol use and not drug abuse   Treatment compliance:  Untreated Relieved by:  Nothing Worsened by:  Nothing tried Ineffective treatments:  None tried Associated symptoms comment:  Pain in her legs when she stands.   Past Medical History  Diagnosis Date  . Ovarian cyst   . Acid reflux   . Hypertension     Past Surgical History  Procedure Laterality Date  . No past surgeries      No family history on file.  History  Substance Use Topics  . Smoking status: Current Every Day Smoker -- 0.25 packs/day for 6.5 years    Types: Cigarettes  . Smokeless tobacco: Never Used  . Alcohol Use: No    OB History   Grav Para Term Preterm  Abortions TAB SAB Ect Mult Living   1 1 0 1 0 0 0 0 0 1       Review of Systems  Constitutional: Negative.   HENT: Negative.   Respiratory: Negative.   Cardiovascular: Negative.   Gastrointestinal: Negative.   Genitourinary: Negative.   Musculoskeletal:       Leg pain when she stands for any length of time.  Skin: Negative.   Neurological: Negative.   Psychiatric/Behavioral: Positive for dysphoric mood. Negative for suicidal ideas.    Allergies  Review of patient's allergies indicates no known allergies.  Home Medications   Current Outpatient Rx  Name  Route  Sig  Dispense  Refill  . hydrochlorothiazide (HYDRODIURIL) 12.5 MG tablet   Oral   Take 1 tablet (12.5 mg total) by mouth daily.   30 tablet   1   . FLUoxetine (PROZAC) 20 MG tablet   Oral   Take 1 tablet (20 mg total) by mouth daily.   30 tablet   1     BP 127/82  Pulse 74  Temp(Src) 97.4 F (36.3 C) (Oral)  Resp 24  Ht 5\' 3"  (1.6 m)  Wt 214 lb (97.07 kg)  BMI 37.92 kg/m2  SpO2 100%  LMP 12/05/2012  Physical Exam  Nursing note and vitals reviewed. Constitutional: She is oriented to person, place, and time.  Obese young woman, no distress.  HENT:  Head: Normocephalic and atraumatic.  Right Ear: External ear normal.  Left Ear: External  ear normal.  Mouth/Throat: Oropharynx is clear and moist.  Eyes: Conjunctivae and EOM are normal. Pupils are equal, round, and reactive to light.  Neck: Normal range of motion. Neck supple.  Cardiovascular: Normal rate, regular rhythm and normal heart sounds.   Pulmonary/Chest: Effort normal and breath sounds normal.  Abdominal: Soft. Bowel sounds are normal.  Musculoskeletal: Normal range of motion. She exhibits no edema.  She localizes the pain to the calves of her legs. There is no palpable deformity and no obvious varicose veins. She has significantly flatfeet.  Neurological: She is alert and oriented to person, place, and time.  No sensory or motor deficit.   Skin: Skin is warm and dry.  Psychiatric: She has a normal mood and affect. Her behavior is normal.  Denies suicidal ideation. Affect not notably distressed.    ED Course  Procedures (including critical care time)  Results for orders placed during the hospital encounter of 12/18/12  BASIC METABOLIC PANEL      Result Value Range   Sodium 140  135 - 145 mEq/L   Potassium 3.9  3.5 - 5.1 mEq/L   Chloride 103  96 - 112 mEq/L   CO2 28  19 - 32 mEq/L   Glucose, Bld 95  70 - 99 mg/dL   BUN 4 (*) 6 - 23 mg/dL   Creatinine, Ser 1.91  0.50 - 1.10 mg/dL   Calcium 9.6  8.4 - 47.8 mg/dL   GFR calc non Af Amer 86 (*) >90 mL/min   GFR calc Af Amer >90  >90 mL/min  CBC      Result Value Range   WBC 6.5  4.0 - 10.5 K/uL   RBC 4.27  3.87 - 5.11 MIL/uL   Hemoglobin 13.3  12.0 - 15.0 g/dL   HCT 29.5  62.1 - 30.8 %   MCV 90.9  78.0 - 100.0 fL   MCH 31.1  26.0 - 34.0 pg   MCHC 34.3  30.0 - 36.0 g/dL   RDW 65.7  84.6 - 96.2 %   Platelets 206  150 - 400 K/uL  URINALYSIS, ROUTINE W REFLEX MICROSCOPIC      Result Value Range   Color, Urine YELLOW  YELLOW   APPearance CLEAR  CLEAR   Specific Gravity, Urine <1.005 (*) 1.005 - 1.030   pH 6.0  5.0 - 8.0   Glucose, UA NEGATIVE  NEGATIVE mg/dL   Hgb urine dipstick NEGATIVE  NEGATIVE   Bilirubin Urine NEGATIVE  NEGATIVE   Ketones, ur NEGATIVE  NEGATIVE mg/dL   Protein, ur NEGATIVE  NEGATIVE mg/dL   Urobilinogen, UA 0.2  0.0 - 1.0 mg/dL   Nitrite NEGATIVE  NEGATIVE   Leukocytes, UA TRACE (*) NEGATIVE  URINE RAPID DRUG SCREEN (HOSP PERFORMED)      Result Value Range   Opiates NONE DETECTED  NONE DETECTED   Cocaine NONE DETECTED  NONE DETECTED   Benzodiazepines NONE DETECTED  NONE DETECTED   Amphetamines NONE DETECTED  NONE DETECTED   Tetrahydrocannabinol NONE DETECTED  NONE DETECTED   Barbiturates NONE DETECTED  NONE DETECTED  URINE MICROSCOPIC-ADD ON      Result Value Range   Squamous Epithelial / LPF FEW (*) RARE   WBC, UA 0-2  <3 WBC/hpf    Bacteria, UA RARE  RARE    Course in the ED:  Patient was seen and had physical examination. Lab tests were all negative. I advised her to use support stockings to help her with her leg  pain. This is most likely due to venous insufficiency following her pregnancy. I advised to take Prozac 20 mg once a day. I told her that this would take 3-6 weeks for it to have its effect, and to be sure of persistent taking the medications even if she did see an improvement in the first week or so. She should followup with depression with [facility].      1. Depression   2. 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

## 2013-07-09 ENCOUNTER — Ambulatory Visit (INDEPENDENT_AMBULATORY_CARE_PROVIDER_SITE_OTHER): Payer: Medicaid Other | Admitting: Obstetrics & Gynecology

## 2013-07-09 ENCOUNTER — Encounter: Payer: Self-pay | Admitting: Obstetrics & Gynecology

## 2013-07-09 ENCOUNTER — Encounter (INDEPENDENT_AMBULATORY_CARE_PROVIDER_SITE_OTHER): Payer: Self-pay

## 2013-07-09 VITALS — BP 120/80 | Wt 252.0 lb

## 2013-07-09 DIAGNOSIS — H539 Unspecified visual disturbance: Secondary | ICD-10-CM

## 2013-07-09 DIAGNOSIS — Z3009 Encounter for other general counseling and advice on contraception: Secondary | ICD-10-CM

## 2013-07-09 MED ORDER — DESOGESTREL-ETHINYL ESTRADIOL 0.15-0.02/0.01 MG (21/5) PO TABS: 1.0000 | ORAL_TABLET | Freq: Every day | ORAL | Status: DC

## 2013-07-09 NOTE — Progress Notes (Signed)
Patient ID: Pamela Ford, female   DOB: 31-Aug-1985, 27 y.o.   MRN: [id number] Pt has noticed vision changes She needs referral to optometrist, letter done  Also started on desogestrel ocp

## 2013-07-25 ENCOUNTER — Telehealth: Payer: Self-pay | Admitting: Adult Health

## 2013-07-25 MED ORDER — NAPROXEN SODIUM 550 MG PO TABS: 550.0000 mg | ORAL_TABLET | Freq: Two times a day (BID) | ORAL | Status: DC

## 2013-07-25 NOTE — Telephone Encounter (Signed)
Pt states having severe cramping with menstrual cycle, OTC meds not helping. Requesting pain medication.

## 2013-07-25 NOTE — Telephone Encounter (Signed)
Complaining of period cramps wil rx anaprox ds 1 bid #60 with 1 [place] [facility] [place]

## 2013-08-01 ENCOUNTER — Encounter (HOSPITAL_COMMUNITY): Payer: Self-pay | Admitting: Emergency Medicine

## 2013-08-01 ENCOUNTER — Emergency Department (HOSPITAL_COMMUNITY)
Admission: EM | Admit: 2013-08-01 | Discharge: 2013-08-01 | Disposition: A | Discharge status: Home / Self Care | Payer: Medicaid Other | Attending: Emergency Medicine | Admitting: Emergency Medicine

## 2013-08-01 DIAGNOSIS — Z3202 Encounter for pregnancy test, result negative: Secondary | ICD-10-CM | POA: Insufficient documentation

## 2013-08-01 DIAGNOSIS — Z8639 Personal history of other endocrine, nutritional and metabolic disease: Secondary | ICD-10-CM | POA: Insufficient documentation

## 2013-08-01 DIAGNOSIS — Z8719 Personal history of other diseases of the digestive system: Secondary | ICD-10-CM | POA: Insufficient documentation

## 2013-08-01 DIAGNOSIS — F17200 Nicotine dependence, unspecified, uncomplicated: Secondary | ICD-10-CM | POA: Insufficient documentation

## 2013-08-01 DIAGNOSIS — I10 Essential (primary) hypertension: Secondary | ICD-10-CM | POA: Insufficient documentation

## 2013-08-01 DIAGNOSIS — Z792 Long term (current) use of antibiotics: Secondary | ICD-10-CM | POA: Insufficient documentation

## 2013-08-01 DIAGNOSIS — N3090 Cystitis, unspecified without hematuria: Secondary | ICD-10-CM

## 2013-08-01 DIAGNOSIS — Z79899 Other long term (current) drug therapy: Secondary | ICD-10-CM | POA: Insufficient documentation

## 2013-08-01 DIAGNOSIS — Z862 Personal history of diseases of the blood and blood-forming organs and certain disorders involving the immune mechanism: Secondary | ICD-10-CM | POA: Insufficient documentation

## 2013-08-01 LAB — URINALYSIS, ROUTINE W REFLEX MICROSCOPIC
Bilirubin Urine: NEGATIVE
GLUCOSE, UA: NEGATIVE mg/dL
HGB URINE DIPSTICK: NEGATIVE
Ketones, ur: NEGATIVE mg/dL
Nitrite: NEGATIVE
Protein, ur: NEGATIVE mg/dL
UROBILINOGEN UA: 0.2 mg/dL (ref 0.0–1.0)
pH: 5.5 (ref 5.0–8.0)

## 2013-08-01 LAB — URINE MICROSCOPIC-ADD ON

## 2013-08-01 LAB — PREGNANCY, URINE: PREG TEST UR: NEGATIVE

## 2013-08-01 MED ORDER — CIPROFLOXACIN HCL 500 MG PO TABS: 500.0000 mg | ORAL_TABLET | Freq: Two times a day (BID) | ORAL | Status: DC

## 2013-08-01 MED ORDER — CIPROFLOXACIN HCL 250 MG PO TABS
500.0000 mg | ORAL_TABLET | Freq: Once | ORAL | Status: AC
  Administered 2013-08-01: 500 mg via ORAL
  Filled 2013-08-01: qty 2

## 2013-08-01 NOTE — Discharge Instructions (Signed)
Urinary Tract Infection A urinary tract infection (UTI) can occur any place along the urinary tract. The tract includes the kidneys, ureters, bladder, and urethra. A type of germ called bacteria often causes a UTI. UTIs are often helped with antibiotic medicine.  HOME CARE   If given, take antibiotics as told by your doctor. Finish them even if you start to feel better.  Drink enough fluids to keep your pee (urine) clear or pale yellow.  Avoid tea, drinks with caffeine, and bubbly (carbonated) drinks.  Pee often. Avoid holding your pee in for a long time.  Pee before and after having sex (intercourse).  Wipe from front to back after you poop (bowel movement) if you are a woman. Use each tissue only once. GET HELP RIGHT AWAY IF:   You have back pain.  You have lower belly (abdominal) pain.  You have chills.  You feel sick to your stomach (nauseous).  You throw up (vomit).  Your burning or discomfort with peeing does not go away.  You have a fever.  Your symptoms are not better in 3 days. MAKE SURE YOU:   Understand these instructions.  Will watch your condition.  Will get help right away if you are not doing well or get worse. Document Released: 12/27/2007 Document Revised: 04/03/2012 Document Reviewed: 02/08/2012 [facility] Patient Information 2014 [place], [place].   You have a minor urinary infection. Increase fluids. Tylenol for discomfort. Antibiotic for 5 days

## 2013-08-01 NOTE — ED Notes (Signed)
Dysuria,  Nausea, no vomiting, no hematuria. No fever

## 2013-08-01 NOTE — ED Provider Notes (Addendum)
CSN: [id number]     Arrival date & time 08/01/13  1439 History   First MD Initiated Contact with Patient 08/01/13 1508     Chief Complaint  Patient presents with  . Dysuria   (Consider location/radiation/quality/duration/timing/severity/associated sxs/prior Treatment) HPI... Sharp suprapubic discomfort, dysuria for 2 days.  No fever, chills, flank pain, hematuria, vaginal bleeding, vaginal discharge. Last menstrual period uncertain.  Severity is mild. Patient has had one urinary tract infection in the past. No radiation of pain.  Past Medical History  Diagnosis Date  . Ovarian cyst   . Acid reflux   . Hypertension    Past Surgical History  Procedure Laterality Date  . No past surgeries     History reviewed. No pertinent family history. History  Substance Use Topics  . Smoking status: Current Every Day Smoker -- 0.25 packs/day for 6.5 years    Types: Cigarettes  . Smokeless tobacco: Never Used  . Alcohol Use: No   OB History   Grav Para Term Preterm Abortions TAB SAB Ect Mult Living   1 1 0 1 0 0 0 0 0 1      Review of Systems  All other systems reviewed and are negative.    Allergies  Review of patient's allergies indicates no known allergies.  Home Medications   Current Outpatient Rx  Name  Route  Sig  Dispense  Refill  . FLUoxetine (PROZAC) 20 MG capsule   Oral   Take 20 mg by mouth daily.         . naproxen sodium (ANAPROX) 550 MG tablet   Oral   Take 1 tablet (550 mg total) by mouth 2 (two) times daily with a meal.   60 tablet   1   . ciprofloxacin (CIPRO) 500 MG tablet   Oral   Take 1 tablet (500 mg total) by mouth 2 (two) times daily. One po bid x 7 days   14 tablet   0   . desogestrel-ethinyl estradiol (KARIVA,AZURETTE,MIRCETTE) 0.15-0.02/0.01 MG (21/5) tablet   Oral   Take 1 tablet by mouth daily.   1 Package   11    BP 138/54  Pulse 73  Temp(Src) 97.7 F (36.5 C) (Oral)  Resp 20  Ht 5\' 3"  (1.6 m)  Wt 216 lb (97.977 kg)  BMI 38.27  kg/m2  SpO2 100%  LMP 06/28/2013  Breastfeeding? No Physical Exam  Nursing note and vitals reviewed. Constitutional: She is oriented to person, place, and time. She appears well-developed and well-nourished.  HENT:  Head: Normocephalic and atraumatic.  Eyes: Conjunctivae and EOM are normal. Pupils are equal, round, and reactive to light.  Neck: Normal range of motion. Neck supple.  Cardiovascular: Normal rate, regular rhythm and normal heart sounds.   Pulmonary/Chest: Effort normal and breath sounds normal.  Abdominal: Soft. Bowel sounds are normal.  Min suprapubic tenderness  Musculoskeletal: Normal range of motion.  Neurological: She is alert and oriented to person, place, and time.  Skin: Skin is warm and dry.  Psychiatric: She has a normal mood and affect. Her behavior is normal.    ED Course  Procedures (including critical care time) Labs Review Labs Reviewed  URINALYSIS, ROUTINE W REFLEX MICROSCOPIC - Abnormal; Notable for the following:    Specific Gravity, Urine <1.005 (*)    Leukocytes, UA SMALL (*)    All other components within normal limits  PREGNANCY, URINE  URINE MICROSCOPIC-ADD ON   Imaging Review No results found.  EKG Interpretation   None  MDM   1. Cystitis    No acute abdomen.  UA shows minor evidence of infection.  Rx Cipro for 5 days plus RX Naprosyn 500 mg for pain    Donnetta HutchingBrian Omah Dewalt, MD 08/01/13 1709  Donnetta HutchingBrian Roxas Clymer, MD 08/02/13 2135

## 2013-08-12 ENCOUNTER — Other Ambulatory Visit: Payer: Medicaid Other | Admitting: Obstetrics & Gynecology

## 2013-08-15 ENCOUNTER — Emergency Department (HOSPITAL_COMMUNITY)
Admission: EM | Admit: 2013-08-15 | Discharge: 2013-08-15 | Disposition: A | Discharge status: Home / Self Care | Payer: Medicaid Other | Attending: Emergency Medicine | Admitting: Emergency Medicine

## 2013-08-15 ENCOUNTER — Encounter (HOSPITAL_COMMUNITY): Payer: Self-pay | Admitting: Emergency Medicine

## 2013-08-15 ENCOUNTER — Emergency Department (HOSPITAL_COMMUNITY): Payer: Medicaid Other

## 2013-08-15 DIAGNOSIS — R209 Unspecified disturbances of skin sensation: Secondary | ICD-10-CM | POA: Insufficient documentation

## 2013-08-15 DIAGNOSIS — M25561 Pain in right knee: Secondary | ICD-10-CM

## 2013-08-15 DIAGNOSIS — Z79899 Other long term (current) drug therapy: Secondary | ICD-10-CM | POA: Insufficient documentation

## 2013-08-15 DIAGNOSIS — M25569 Pain in unspecified knee: Secondary | ICD-10-CM | POA: Insufficient documentation

## 2013-08-15 DIAGNOSIS — F17200 Nicotine dependence, unspecified, uncomplicated: Secondary | ICD-10-CM | POA: Insufficient documentation

## 2013-08-15 DIAGNOSIS — I10 Essential (primary) hypertension: Secondary | ICD-10-CM | POA: Insufficient documentation

## 2013-08-15 DIAGNOSIS — Z8719 Personal history of other diseases of the digestive system: Secondary | ICD-10-CM | POA: Insufficient documentation

## 2013-08-15 DIAGNOSIS — N83209 Unspecified ovarian cyst, unspecified side: Secondary | ICD-10-CM | POA: Insufficient documentation

## 2013-08-15 MED ORDER — IBUPROFEN 400 MG PO TABS
600.0000 mg | ORAL_TABLET | Freq: Once | ORAL | Status: AC
  Administered 2013-08-15: 600 mg via ORAL
  Filled 2013-08-15: qty 2

## 2013-08-15 MED ORDER — HYDROCODONE-ACETAMINOPHEN 5-325 MG PO TABS
1.0000 | ORAL_TABLET | Freq: Once | ORAL | Status: AC
  Administered 2013-08-15: 1 via ORAL
  Filled 2013-08-15: qty 1

## 2013-08-15 MED ORDER — HYDROCODONE-ACETAMINOPHEN 5-325 MG PO TABS: 1.0000 | ORAL_TABLET | ORAL | Status: DC | PRN

## 2013-08-15 MED ORDER — NAPROXEN SODIUM 550 MG PO TABS: 550.0000 mg | ORAL_TABLET | Freq: Two times a day (BID) | ORAL | Status: DC

## 2013-08-15 NOTE — ED Notes (Signed)
Patient c/o right leg pain for 1 year; patient states her leg will go numb.

## 2013-08-15 NOTE — ED Notes (Signed)
Patient states pain in right knee is "getting worse"  - thinks moving it for the xray has caused increased pain

## 2013-08-15 NOTE — ED Provider Notes (Signed)
CSN: [id number]     Arrival date & time 08/15/13  0231 History   First MD Initiated Contact with Patient 08/15/13 0300     Chief Complaint  Patient presents with  . Leg Pain   (Consider location/radiation/quality/duration/timing/severity/associated sxs/prior Treatment) HPI Patient reports right leg and right knee pain over the past year.  She states that her right lower leg intermittently goes numb mainly when she was in a sitting position.  Usually as she stands up and starts moving around it gets better.  She denies swelling of her legs.  No recent injury or trauma.  She states the majority of her pain radiates towards her right knee.  She denies weakness of her right lower extremity.  No skin changes.  Symptoms are mild to moderate in severity.   Past Medical History  Diagnosis Date  . Ovarian cyst   . Acid reflux   . Hypertension    Past Surgical History  Procedure Laterality Date  . No past surgeries     No family history on file. History  Substance Use Topics  . Smoking status: Current Every Day Smoker -- 0.25 packs/day for 6.5 years    Types: Cigarettes  . Smokeless tobacco: Never Used  . Alcohol Use: No   OB History   Grav Para Term Preterm Abortions TAB SAB Ect Mult Living   1 1 0 1 0 0 0 0 0 1      Review of Systems  All other systems reviewed and are negative.    Allergies  Review of patient's allergies indicates no known allergies.  Home Medications   Current Outpatient Rx  Name  Route  Sig  Dispense  Refill  . ciprofloxacin (CIPRO) 500 MG tablet   Oral   Take 1 tablet (500 mg total) by mouth 2 (two) times daily. One po bid x 7 days   14 tablet   0   . desogestrel-ethinyl estradiol (KARIVA,AZURETTE,MIRCETTE) 0.15-0.02/0.01 MG (21/5) tablet   Oral   Take 1 tablet by mouth daily.   1 Package   11   . FLUoxetine (PROZAC) 20 MG capsule   Oral   Take 20 mg by mouth daily.         Marland Kitchen. HYDROcodone-acetaminophen (NORCO/VICODIN) 5-325 MG per tablet  Oral   Take 1 tablet by mouth every 4 (four) hours as needed for moderate pain.   8 tablet   0   . naproxen sodium (ANAPROX) 550 MG tablet   Oral   Take 1 tablet (550 mg total) by mouth 2 (two) times daily with a meal.   60 tablet   1    BP 126/68  Temp(Src) 98.4 F (36.9 C) (Oral)  Resp 20  Ht 5\' 3"  (1.6 m)  Wt 216 lb (97.977 kg)  BMI 38.27 kg/m2  SpO2 99%  LMP 08/03/2013  Breastfeeding? Unknown Physical Exam  Nursing note and vitals reviewed. Constitutional: She is oriented to person, place, and time. She appears well-developed and well-nourished.  HENT:  Head: Normocephalic.  Eyes: EOM are normal.  Neck: Normal range of motion.  Pulmonary/Chest: Effort normal.  Abdominal: She exhibits no distension.  Musculoskeletal: Normal range of motion.  Full range of motion of right hip.  Some pain with range of motion of her right knee but is able to range fully.  Normal range of motion of the right ankle.  Normal right PT and DP pulse.  Compartment soft.  Mild medial joint line tenderness of her right knee.  No  warmth, erythema, joint effusion noted of the right knee.  Neurological: She is alert and oriented to person, place, and time.  Psychiatric: She has a normal mood and affect.    ED Course  Procedures (including critical care time) Labs Review Labs Reviewed - No data to display Imaging Review Dg Knee Complete 4 Views Right  08/15/2013   CLINICAL DATA:  Right knee pain.  EXAM: RIGHT KNEE - COMPLETE 4+ VIEW  COMPARISON:  None.  FINDINGS: There is no evidence of fracture or dislocation. The joint spaces are preserved. No significant degenerative change is seen; the patellofemoral joint is grossly unremarkable in appearance.  No significant joint effusion is seen. The visualized soft tissues are normal in appearance.  IMPRESSION: No evidence of fracture or dislocation.   Electronically Signed   By: Roanna Raider M.D.   On: 08/15/2013 04:33  I personally reviewed the imaging  tests through PACS system I reviewed available ER/hospitalization records through the EMR   EKG Interpretation   None       MDM   1. Right knee pain    Chronic right knee and difficulty with numbness of her right lower extremity.  Normal pulses.  Doubt arterial issue.  Doubt deep vein thrombosis.  No signs of infection.  X-rays negative.  Outpatient followup.    Lyanne Co, MD 08/15/13 [phone]

## 2013-08-16 ENCOUNTER — Encounter (HOSPITAL_COMMUNITY): Payer: Self-pay | Admitting: Emergency Medicine

## 2013-08-16 ENCOUNTER — Emergency Department (HOSPITAL_COMMUNITY)
Admission: EM | Admit: 2013-08-16 | Discharge: 2013-08-16 | Disposition: A | Discharge status: Home / Self Care | Payer: Medicaid Other | Attending: Emergency Medicine | Admitting: Emergency Medicine

## 2013-08-16 DIAGNOSIS — Z79899 Other long term (current) drug therapy: Secondary | ICD-10-CM | POA: Insufficient documentation

## 2013-08-16 DIAGNOSIS — Z8719 Personal history of other diseases of the digestive system: Secondary | ICD-10-CM | POA: Insufficient documentation

## 2013-08-16 DIAGNOSIS — M25561 Pain in right knee: Secondary | ICD-10-CM

## 2013-08-16 DIAGNOSIS — G8929 Other chronic pain: Secondary | ICD-10-CM | POA: Insufficient documentation

## 2013-08-16 DIAGNOSIS — Z8742 Personal history of other diseases of the female genital tract: Secondary | ICD-10-CM | POA: Insufficient documentation

## 2013-08-16 DIAGNOSIS — M25569 Pain in unspecified knee: Secondary | ICD-10-CM | POA: Insufficient documentation

## 2013-08-16 DIAGNOSIS — F17200 Nicotine dependence, unspecified, uncomplicated: Secondary | ICD-10-CM | POA: Insufficient documentation

## 2013-08-16 DIAGNOSIS — I10 Essential (primary) hypertension: Secondary | ICD-10-CM | POA: Insufficient documentation

## 2013-08-16 MED ORDER — NAPROXEN 250 MG PO TABS
500.0000 mg | ORAL_TABLET | Freq: Once | ORAL | Status: AC
  Administered 2013-08-16: 500 mg via ORAL
  Filled 2013-08-16: qty 2

## 2013-08-16 MED ORDER — HYDROCODONE-ACETAMINOPHEN 5-325 MG PO TABS
2.0000 | ORAL_TABLET | Freq: Once | ORAL | Status: AC
  Administered 2013-08-16: 2 via ORAL
  Filled 2013-08-16: qty 2

## 2013-08-16 NOTE — Discharge Instructions (Signed)
Please call the orthopedic specialist to whom you were referred yesterday for ongoing evaluation as an outpatient.    I have reviewed your xrays - they appear normal.  Please call your doctor for a followup appointment within 24-48 hours. When you talk to your doctor please let them know that you were seen in the emergency department and have them acquire all of your records so that they can discuss the findings with you and formulate a treatment plan to fully care for your new and ongoing problems.

## 2013-08-16 NOTE — ED Notes (Signed)
Pt came to ER complaining of right knee pain was seen yesterday w/ the same. Thinks it may need to wrapped or something.

## 2013-08-16 NOTE — ED Provider Notes (Signed)
CSN: [id number]     Arrival date & time 08/16/13  0110 History   First MD Initiated Contact with Patient 08/16/13 0133     Chief Complaint  Patient presents with  . Leg Pain   (Consider location/radiation/quality/duration/timing/severity/associated sxs/prior Treatment) HPI Comments: 28 year old obese female presents with right lower extremity pain, predominantly at the knee which has been going on for over one year, she denies knowing if there was an initial injury but notes that she has chronic pain which seems to get worse when she walks, it is present constantly 24 hours a day for months it is not associated with swelling redness or fevers. She never has any other joint arthropathy, she does note occasional tingling of her thigh or calf which is short-lived and resolve spontaneously. She has not been seen by a specialist, she has been seen in the emergency department as recently as yesterday, imaging of the right knee was totally normal and she was prescribed pain medications which she states she has not taken at home.  Patient is a 28 y.o. female presenting with leg pain. The history is provided by the patient, the spouse and medical records.  Leg Pain   Past Medical History  Diagnosis Date  . Ovarian cyst   . Acid reflux   . Hypertension    Past Surgical History  Procedure Laterality Date  . No past surgeries     No family history on file. History  Substance Use Topics  . Smoking status: Current Every Day Smoker -- 0.25 packs/day for 6.5 years    Types: Cigarettes  . Smokeless tobacco: Never Used  . Alcohol Use: No   OB History   Grav Para Term Preterm Abortions TAB SAB Ect Mult Living   1 1 0 1 0 0 0 0 0 1      Review of Systems  All other systems reviewed and are negative.    Allergies  Review of patient's allergies indicates no known allergies.  Home Medications   Current Outpatient Rx  Name  Route  Sig  Dispense  Refill  . desogestrel-ethinyl estradiol  (KARIVA,AZURETTE,MIRCETTE) 0.15-0.02/0.01 MG (21/5) tablet   Oral   Take 1 tablet by mouth daily.   1 Package   11   . FLUoxetine (PROZAC) 20 MG capsule   Oral   Take 20 mg by mouth daily.         Marland Kitchen HYDROcodone-acetaminophen (NORCO/VICODIN) 5-325 MG per tablet   Oral   Take 1 tablet by mouth every 4 (four) hours as needed for moderate pain.   8 tablet   0   . naproxen sodium (ANAPROX) 550 MG tablet   Oral   Take 1 tablet (550 mg total) by mouth 2 (two) times daily with a meal.   60 tablet   1   . ciprofloxacin (CIPRO) 500 MG tablet   Oral   Take 1 tablet (500 mg total) by mouth 2 (two) times daily. One po bid x 7 days   14 tablet   0    BP 134/86  Pulse 83  Temp(Src) 98.5 F (36.9 C) (Oral)  Resp 22  Ht 5\' 3"  (1.6 m)  Wt 216 lb (97.977 kg)  BMI 38.27 kg/m2  SpO2 95%  LMP 08/03/2013 Physical Exam  Nursing note and vitals reviewed. Constitutional: She appears well-developed and well-nourished. No distress.  HENT:  Head: Normocephalic and atraumatic.  Mouth/Throat: Oropharynx is clear and moist. No oropharyngeal exudate.  Eyes: Conjunctivae and EOM are normal. Pupils  are equal, round, and reactive to light. Right eye exhibits no discharge. Left eye exhibits no discharge. No scleral icterus.  Neck: Normal range of motion. Neck supple. No JVD present. No thyromegaly present.  Cardiovascular: Normal rate, regular rhythm, normal heart sounds and intact distal pulses.  Exam reveals no gallop and no friction rub.   No murmur heard. Pulmonary/Chest: Effort normal and breath sounds normal. No respiratory distress. She has no wheezes. She has no rales.  Musculoskeletal: Normal range of motion. She exhibits tenderness ( Pain with range of motion of the right knee, the joints are supple, compartments are soft, there is no asymmetry or peripheral edema, able to straight leg raise without difficulty). She exhibits no edema.   No redness warmth or swelling of the right knee joint,  no crepitance with range of motion  Lymphadenopathy:    She has no cervical adenopathy.  Neurological: She is alert. Coordination normal.  Skin: Skin is warm and dry. No rash noted. No erythema.  Psychiatric: She has a normal mood and affect. Her behavior is normal.    ED Course  Procedures (including critical care time) Labs Review Labs Reviewed - No data to display Imaging Review Dg Knee Complete 4 Views Right  08/15/2013   CLINICAL DATA:  Right knee pain.  EXAM: RIGHT KNEE - COMPLETE 4+ VIEW  COMPARISON:  None.  FINDINGS: There is no evidence of fracture or dislocation. The joint spaces are preserved. No significant degenerative change is seen; the patellofemoral joint is grossly unremarkable in appearance.  No significant joint effusion is seen. The visualized soft tissues are normal in appearance.  IMPRESSION: No evidence of fracture or dislocation.   Electronically Signed   By: Roanna RaiderJeffery  Chang M.D.   On: 08/15/2013 04:33    EKG Interpretation   None       MDM   1. Chronic pain of right knee    X-rays reviewed from yesterday, no signs of arthritis, no signs of fracture or tumor, the patient has normal neurologic exam with normal strength and sensation, on known etiology of the patient's chronic right knee pain, likely needs further workup by outpatient orthopedist, she was referred yesterday, states that she still has the referral paperwork as well as the prescription she was given yesterday.   Meds given in ED:  Medications  naproxen (NAPROSYN) tablet 500 mg (not administered)  HYDROcodone-acetaminophen (NORCO/VICODIN) 5-325 MG per tablet 2 tablet (not administered)    New Prescriptions   No medications on file        Vida RollerBrian D Ramello Cordial, MD 08/16/13 0150

## 2013-08-16 NOTE — ED Notes (Signed)
Pt alert & oriented x4, stable gait. Patient given discharge instructions, paperwork & prescription(s). Patient  instructed to stop at the registration desk to finish any additional paperwork. Patient verbalized understanding. Pt left department w/ no further questions. 

## 2013-08-17 ENCOUNTER — Encounter (HOSPITAL_COMMUNITY): Payer: Self-pay | Admitting: Emergency Medicine

## 2013-08-17 ENCOUNTER — Emergency Department (HOSPITAL_COMMUNITY)
Admission: EM | Admit: 2013-08-17 | Discharge: 2013-08-17 | Disposition: A | Discharge status: Home / Self Care | Payer: Medicaid Other | Attending: Emergency Medicine | Admitting: Emergency Medicine

## 2013-08-17 DIAGNOSIS — Z79899 Other long term (current) drug therapy: Secondary | ICD-10-CM | POA: Insufficient documentation

## 2013-08-17 DIAGNOSIS — Z8719 Personal history of other diseases of the digestive system: Secondary | ICD-10-CM | POA: Insufficient documentation

## 2013-08-17 DIAGNOSIS — Z791 Long term (current) use of non-steroidal anti-inflammatories (NSAID): Secondary | ICD-10-CM | POA: Insufficient documentation

## 2013-08-17 DIAGNOSIS — F17200 Nicotine dependence, unspecified, uncomplicated: Secondary | ICD-10-CM | POA: Insufficient documentation

## 2013-08-17 DIAGNOSIS — M25561 Pain in right knee: Secondary | ICD-10-CM

## 2013-08-17 DIAGNOSIS — I10 Essential (primary) hypertension: Secondary | ICD-10-CM | POA: Insufficient documentation

## 2013-08-17 DIAGNOSIS — Z8742 Personal history of other diseases of the female genital tract: Secondary | ICD-10-CM | POA: Insufficient documentation

## 2013-08-17 DIAGNOSIS — M25569 Pain in unspecified knee: Secondary | ICD-10-CM | POA: Insufficient documentation

## 2013-08-17 MED ORDER — IBUPROFEN 800 MG PO TABS
800.0000 mg | ORAL_TABLET | Freq: Once | ORAL | Status: AC
  Administered 2013-08-17: 800 mg via ORAL
  Filled 2013-08-17: qty 1

## 2013-08-17 NOTE — ED Provider Notes (Signed)
CSN: [id number]     Arrival date & time 08/17/13  1601 History   First MD Initiated Contact with Patient 08/17/13 1726     Chief Complaint  Patient presents with  . Knee Pain    Patient is a 28 y.o. female presenting with knee pain. The history is provided by the patient.  Knee Pain Location:  Knee Knee location:  R knee Pain details:    Quality:  Aching   Radiates to:  Does not radiate   Severity:  Moderate   Onset quality:  Gradual   Duration: 1 year ago.   Timing:  Constant   Progression:  Worsening Chronicity:  Chronic Relieved by:  Rest Worsened by:  Activity Associated symptoms: no fever, no muscle weakness and no swelling     Past Medical History  Diagnosis Date  . Ovarian cyst   . Acid reflux   . Hypertension    Past Surgical History  Procedure Laterality Date  . No past surgeries     No family history on file. History  Substance Use Topics  . Smoking status: Current Every Day Smoker -- 0.25 packs/day for 6.5 years    Types: Cigarettes  . Smokeless tobacco: Never Used  . Alcohol Use: No   OB History   Grav Para Term Preterm Abortions TAB SAB Ect Mult Living   1 1 0 1 0 0 0 0 0 1      Review of Systems  Constitutional: Negative for fever.  Respiratory: Negative for shortness of breath.   Cardiovascular: Negative for chest pain and leg swelling.    Allergies  Review of patient's allergies indicates no known allergies.  Home Medications   Current Outpatient Rx  Name  Route  Sig  Dispense  Refill  . ciprofloxacin (CIPRO) 500 MG tablet   Oral   Take 1 tablet (500 mg total) by mouth 2 (two) times daily. One po bid x 7 days   14 tablet   0   . desogestrel-ethinyl estradiol (KARIVA,AZURETTE,MIRCETTE) 0.15-0.02/0.01 MG (21/5) tablet   Oral   Take 1 tablet by mouth daily.   1 Package   11   . FLUoxetine (PROZAC) 20 MG capsule   Oral   Take 20 mg by mouth daily.         Marland Kitchen HYDROcodone-acetaminophen (NORCO/VICODIN) 5-325 MG per tablet   Oral   Take 1 tablet by mouth every 4 (four) hours as needed for moderate pain.   8 tablet   0   . naproxen sodium (ANAPROX) 550 MG tablet   Oral   Take 1 tablet (550 mg total) by mouth 2 (two) times daily with a meal.   60 tablet   1    BP 134/97  Pulse 95  Temp(Src) 98.5 F (36.9 C) (Oral)  Resp 24  Ht 5\' 3"  (1.6 m)  Wt 216 lb (97.977 kg)  BMI 38.27 kg/m2  SpO2 100%  LMP 08/03/2013 Physical Exam CONSTITUTIONAL: Well developed/well nourished HEAD: Normocephalic/atraumatic EYES: EOMI/PERRL ENMT: Mucous membranes moist NECK: supple no meningeal signs CV: S1/S2 noted, no murmurs/rubs/gallops noted LUNGS: Lungs are clear to auscultation bilaterally, no apparent distress ABDOMEN: soft, nontender, no rebound or guarding NEURO: Pt is awake/alert, moves all extremitiesx4, pt can ambulate unassisted EXTREMITIES: pulses normal, full ROM Tenderness to palpation of right patella. No overlying erythema/edema noted. She can fully range the right knee but reproduces pain.  There is no calf tenderness or edema noted.  No LE edema noted.   SKIN: warm,  color normal PSYCH: no abnormalities of mood noted  ED Course  Procedures (including critical care time) Labs Review Labs Reviewed - No data to display Imaging Review No results found.  EKG Interpretation   None       MDM   1. Right knee pain    Nursing notes including past medical history and social history reviewed and considered in documentation Previous records reviewed and considered  Pt requesting crutches She is calling for ortho f/u tomorrow Her pain is localized to knee joint Doubt acute DVT at this time    Joya Gaskinsonald W Burnice Vassel, MD 08/17/13 1810

## 2013-08-17 NOTE — Discharge Instructions (Signed)
Arthralgia  Arthralgia is joint pain. A joint is a place where two bones meet. Joint pain can happen for many reasons. The joint can be bruised, stiff, infected, or weak from aging. Pain usually goes away after resting and taking medicine for soreness.   HOME CARE  · Rest the joint as told by your doctor.  · Keep the sore joint raised (elevated) for the first 24 hours.  · Put ice on the joint area.  · Put ice in a plastic bag.  · Place a towel between your skin and the bag.  · Leave the ice on for 15-20 minutes, 03-04 times a day.  · Wear your splint, casting, elastic bandage, or sling as told by your doctor.  · Only take medicine as told by your doctor. Do not take aspirin.  · Use crutches as told by your doctor. Do not put weight on the joint until told to by your doctor.  GET HELP RIGHT AWAY IF:   · You have bruising, puffiness (swelling), or more pain.  · Your fingers or toes turn blue or start to lose feeling (numb).  · Your medicine does not lessen the pain.  · Your pain becomes severe.  · You have a temperature by mouth above 102° F (38.9° C), not controlled by medicine.  · You cannot move or use the joint.  MAKE SURE YOU:   · Understand these instructions.  · Will watch your condition.  · Will get help right away if you are not doing well or get worse.  Document Released: 06/28/2009 Document Revised: 10/02/2011 Document Reviewed: 06/28/2009  [employer]® Patient Information ©2014 [employer].

## 2013-08-17 NOTE — ED Notes (Signed)
Pt c/o right knee pain that started last week, was seen the past two days in er, states that she is not getting any better, denies any injury, pt ambulatory to tx room with no problems, cms intact distal

## 2013-08-18 ENCOUNTER — Other Ambulatory Visit: Payer: Medicaid Other | Admitting: Obstetrics & Gynecology

## 2013-08-20 ENCOUNTER — Ambulatory Visit (INDEPENDENT_AMBULATORY_CARE_PROVIDER_SITE_OTHER): Payer: Medicaid Other | Admitting: Orthopedic Surgery

## 2013-08-20 VITALS — BP 121/92 | Ht 63.0 in | Wt 259.0 lb

## 2013-08-20 DIAGNOSIS — M25569 Pain in unspecified knee: Secondary | ICD-10-CM

## 2013-08-20 DIAGNOSIS — M23329 Other meniscus derangements, posterior horn of medial meniscus, unspecified knee: Secondary | ICD-10-CM

## 2013-08-20 MED ORDER — PREDNISONE 10 MG PO KIT: 10.0000 mg | PACK | ORAL | Status: DC

## 2013-08-20 MED ORDER — HYDROCODONE-ACETAMINOPHEN 5-325 MG PO TABS: 1.0000 | ORAL_TABLET | ORAL | Status: DC | PRN

## 2013-08-20 NOTE — Patient Instructions (Addendum)
For next 5 weeks  Take naproxen and norco 5  Walk with crutches

## 2013-08-21 ENCOUNTER — Encounter: Payer: Self-pay | Admitting: Orthopedic Surgery

## 2013-08-21 ENCOUNTER — Other Ambulatory Visit: Payer: Medicaid Other | Admitting: Obstetrics & Gynecology

## 2013-08-21 DIAGNOSIS — M25569 Pain in unspecified knee: Secondary | ICD-10-CM | POA: Insufficient documentation

## 2013-08-21 DIAGNOSIS — M23329 Other meniscus derangements, posterior horn of medial meniscus, unspecified knee: Secondary | ICD-10-CM | POA: Insufficient documentation

## 2013-08-21 NOTE — Progress Notes (Signed)
  Subjective:    Pamela Ford is a 28 y.o. female who presents with knee pain involving the right knee. Onset was sudden, not related to any specific activity. Inciting event: none known. Current symptoms include: locking, pain located diffusely, stiffness and swelling. Pain is aggravated by any weight bearing. Patient has had no prior knee problems. Evaluation to date: plain films: normal. Treatment to date: elastic supporter which is not very effective, ice and norco.  The following portions of the patient's history were reviewed and updated as appropriate: allergies, current medications, past family history, past medical history, past social history, past surgical history and problem list.   Review of Systems Pertinent items are noted in HPI.   Objective:    BP 121/92  Ht 5\' 3"  (1.6 m)  Wt 259 lb (117.482 kg)  BMI 45.89 kg/m2  LMP 08/03/2013 Right knee: positive exam findings: medial joint line tenderness and ROM limited to approximately 90 degrees and negative exam findings: no effusion, no erythema, no tenderness, ACL stable, PCL stable, MCL stable, LCL stable, no patellar laxity and McMurray's negative  Left knee:  normal and no effusion, full active range of motion, no joint line tenderness, ligamentous structures intact.   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 Questions answered. Rest, ice, compression, and elevation (RICE) therapy. Crutches. NSAIDs per medication orders. Follow up in 5 weeks.

## 2013-08-26 ENCOUNTER — Other Ambulatory Visit (HOSPITAL_COMMUNITY)
Admission: RE | Admit: 2013-08-26 | Discharge: 2013-08-26 | Disposition: A | Discharge status: Home / Self Care | Payer: Medicaid Other | Source: Ambulatory Visit | Attending: Obstetrics & Gynecology | Admitting: Obstetrics & Gynecology

## 2013-08-26 ENCOUNTER — Ambulatory Visit (INDEPENDENT_AMBULATORY_CARE_PROVIDER_SITE_OTHER): Payer: Medicaid Other | Admitting: Obstetrics & Gynecology

## 2013-08-26 ENCOUNTER — Telehealth: Payer: Self-pay

## 2013-08-26 ENCOUNTER — Telehealth: Payer: Self-pay | Admitting: Obstetrics and Gynecology

## 2013-08-26 ENCOUNTER — Encounter: Payer: Self-pay | Admitting: Obstetrics & Gynecology

## 2013-08-26 ENCOUNTER — Encounter (INDEPENDENT_AMBULATORY_CARE_PROVIDER_SITE_OTHER): Payer: Self-pay

## 2013-08-26 VITALS — BP 120/70 | Ht 63.0 in | Wt 257.0 lb

## 2013-08-26 DIAGNOSIS — Z01419 Encounter for gynecological examination (general) (routine) without abnormal findings: Secondary | ICD-10-CM | POA: Insufficient documentation

## 2013-08-26 DIAGNOSIS — Z0000 Encounter for general adult medical examination without abnormal findings: Secondary | ICD-10-CM

## 2013-08-26 MED ORDER — KETOROLAC TROMETHAMINE 10 MG PO TABS: 10.0000 mg | ORAL_TABLET | Freq: Three times a day (TID) | ORAL | Status: DC | PRN

## 2013-08-26 MED ORDER — DESOGESTREL-ETHINYL ESTRADIOL 0.15-0.02/0.01 MG (21/5) PO TABS: 1.0000 | ORAL_TABLET | Freq: Every day | ORAL | Status: DC

## 2013-08-26 MED ORDER — TERCONAZOLE 0.4 % VA CREA: 1.0000 | TOPICAL_CREAM | Freq: Every day | VAGINAL | Status: DC

## 2013-08-26 MED ORDER — OMEPRAZOLE 20 MG PO CPDR: 20.0000 mg | DELAYED_RELEASE_CAPSULE | Freq: Every day | ORAL | Status: DC

## 2013-08-26 NOTE — Progress Notes (Signed)
Patient ID: Pamela Ford, female   DOB: 02/11/1986, 28 y.o.   MRN: [id number] Subjective:     Pamela Ford is a 28 y.o. female here for a routine exam.  [name] last menstrual period was 08/03/2013. G1P0101 Birth Control Method:  ocp Menstrual Calendar(currently): regular  Current complaints: dysmenorrhea.   Current acute medical issues:  none   Recent Gynecologic History [name] last menstrual period was 08/03/2013. Last Pap: 2012,  normal Last mammogram: na,    Past Medical History  Diagnosis Date  . Ovarian cyst   . Acid reflux   . Hypertension     Past Surgical History  Procedure Laterality Date  . No past surgeries      OB History   Grav Para Term Preterm Abortions TAB SAB Ect Mult Living   1 1 0 1 0 0 0 0 0 1       History   Social History  . Marital Status: Married    Spouse Name: N/A    Number of Children: N/A  . Years of Education: N/A   Social History Main Topics  . Smoking status: Current Every Day Smoker -- 0.25 packs/day for 6.5 years    Types: Cigarettes  . Smokeless tobacco: Never Used  . Alcohol Use: No  . Drug Use: No  . Sexual Activity: Yes    Birth Control/ Protection: None   Other Topics Concern  . None   Social History Narrative  . None    Family History  Problem Relation Age of Onset  . Diabetes Mother   . Hypertension Mother   . Diabetes Father      Review of Systems  Review of Systems  Constitutional: Negative for fever, chills, weight loss, malaise/fatigue and diaphoresis.  HENT: Negative for hearing loss, ear pain, nosebleeds, congestion, sore throat, neck pain, tinnitus and ear discharge.   Eyes: Negative for blurred vision, double vision, photophobia, pain, discharge and redness.  Respiratory: Negative for cough, hemoptysis, sputum production, shortness of breath, wheezing and stridor.   Cardiovascular: Negative for chest pain, palpitations, orthopnea, claudication, leg swelling and PND.  Gastrointestinal:  negative for abdominal pain. Negative for heartburn, nausea, vomiting, diarrhea, constipation, blood in stool and melena.  Genitourinary: Negative for dysuria, urgency, frequency, hematuria and flank pain.  Musculoskeletal: Negative for myalgias, back pain, joint pain and falls.  Skin: Negative for itching and rash.  Neurological: Negative for dizziness, tingling, tremors, sensory change, speech change, focal weakness, seizures, loss of consciousness, weakness and headaches.  Endo/Heme/Allergies: Negative for environmental allergies and polydipsia. Does not bruise/bleed easily.  Psychiatric/Behavioral: Negative for depression, suicidal ideas, hallucinations, memory loss and substance abuse. The patient is not nervous/anxious and does not have insomnia.        Objective:    Physical Exam  Vitals reviewed. Constitutional: She is oriented to person, place, and time. She appears well-developed and well-nourished.  HENT:  Head: Normocephalic and atraumatic.        Right Ear: External ear normal.  Left Ear: External ear normal.  Nose: Nose normal.  Mouth/Throat: Oropharynx is clear and moist.  Eyes: Conjunctivae and EOM are normal. Pupils are equal, round, and reactive to light. Right eye exhibits no discharge. Left eye exhibits no discharge. No scleral icterus.  Neck: Normal range of motion. Neck supple. No tracheal deviation present. No thyromegaly present.  Cardiovascular: Normal rate, regular rhythm, normal heart sounds and intact distal pulses.  Exam reveals no gallop and no friction rub.   No murmur heard.  Respiratory: Effort normal and breath sounds normal. No respiratory distress. She has no wheezes. She has no rales. She exhibits no tenderness.  GI: Soft. Bowel sounds are normal. She exhibits no distension and no mass. There is no tenderness. There is no rebound and no guarding.  Genitourinary:  Breasts no masses skin changes or nipple changes bilaterally      Vulva is normal without  lesions Vagina is pink moist +yeast infection Cervix normal in appearance and pap is done Uterus is normal size shape and contour Adnexa is negative with normal sized ovaries    Musculoskeletal: Normal range of motion. She exhibits no edema and no tenderness.  Neurological: She is alert and oriented to person, place, and time. She has normal reflexes. She displays normal reflexes. No cranial nerve deficit. She exhibits normal muscle tone. Coordination normal.  Skin: Skin is warm and dry. No rash noted. No erythema. No pallor.  Psychiatric: She has a normal mood and affect. Her behavior is normal. Judgment and thought content normal.       Assessment:    Healthy female exam.    Plan:    Contraception: OCP (estrogen/progesterone). Follow up in: 1 year. Terazol 7

## 2013-08-26 NOTE — Addendum Note (Signed)
Addended by: Criss AlvinePULLIAM, CHRYSTAL G on: 08/26/2013 12:07 PM   Modules accepted: Orders

## 2013-08-26 NOTE — Telephone Encounter (Signed)
Spoke with pt letting her know Prilosec had been sent to pharmacy. JSY

## 2013-08-26 NOTE — Telephone Encounter (Signed)
Pt requesting a RX for acid reflux saw Dr. Despina HiddenEure today.

## 2013-08-29 ENCOUNTER — Telehealth: Payer: Self-pay | Admitting: Adult Health

## 2013-08-29 MED ORDER — FLUCONAZOLE 150 MG PO TABS: 150.0000 mg | ORAL_TABLET | Freq: Once | ORAL | Status: DC

## 2013-08-29 NOTE — Telephone Encounter (Signed)
Requesting something for yeast. Pt states Dr. Despina HiddenEure gave her a "cream" at last appt but not helping.

## 2013-08-29 NOTE — Telephone Encounter (Signed)
Pt informed Diflucan e-scribed 

## 2013-09-21 DIAGNOSIS — K011 Impacted teeth: Secondary | ICD-10-CM

## 2013-09-21 HISTORY — DX: Impacted teeth: K01.1

## 2013-09-25 ENCOUNTER — Ambulatory Visit: Payer: Medicaid Other | Admitting: Orthopedic Surgery

## 2013-09-29 ENCOUNTER — Encounter (HOSPITAL_BASED_OUTPATIENT_CLINIC_OR_DEPARTMENT_OTHER): Payer: Self-pay | Admitting: *Deleted

## 2013-10-01 ENCOUNTER — Ambulatory Visit (INDEPENDENT_AMBULATORY_CARE_PROVIDER_SITE_OTHER): Payer: Medicaid Other | Admitting: Orthopedic Surgery

## 2013-10-01 VITALS — Ht 63.0 in | Wt 261.0 lb

## 2013-10-01 DIAGNOSIS — M25569 Pain in unspecified knee: Secondary | ICD-10-CM

## 2013-10-01 MED ORDER — HYDROCODONE-ACETAMINOPHEN 5-325 MG PO TABS: 1.0000 | ORAL_TABLET | ORAL | Status: DC | PRN

## 2013-10-01 MED ORDER — IBUPROFEN 800 MG PO TABS: 800.0000 mg | ORAL_TABLET | Freq: Three times a day (TID) | ORAL | Status: DC | PRN

## 2013-10-01 NOTE — Progress Notes (Signed)
Patient ID: Pamela Ford, female   DOB: 1985/11/12, 28 y.o.   MRN: [id number]  Chief Complaint  Patient presents with  . Follow-up    5 week recheck right knee   Ht 5\' 3"  (1.6 m)  Wt 261 lb (118.389 kg)  BMI 46.25 kg/m2  LMP 09/04/2013 Encounter Diagnosis  Name Primary?  . Knee pain Yes    Followup status post injury to right knee. Patient still complains of pain medial side with swelling.  She also complains of some catching and locking  Reexamination she has medial joint line tenderness no swelling full range of motion positive McMurray sign neurovascular exam is intact vital signs are stable Ht 5\' 3"  (1.6 m)  Wt 261 lb (118.389 kg)  BMI 46.25 kg/m2  LMP 09/04/2013 . She's awake alert and oriented x3.  Recommend physical therapy return in 2 weeks if no improvement recommend MRI of the right knee for medial meniscal tear

## 2013-10-01 NOTE — Patient Instructions (Signed)
PT IBUPROFEN  RETURN IN 2 WEEKS

## 2013-10-02 NOTE — H&P (Signed)
HISTORY AND PHYSICAL  Pamela Ford is a 28 y.o. female patient with CC: wisdom teeth  No diagnosis found.  Past Medical History  Diagnosis Date  . Acid reflux   . Depression   . Anxiety   . Impacted tooth 09/2013    impacted wisdom teeth  . Leg pain, right     unknown cause, is being evaluated    No current facility-administered medications for this encounter.   Current Outpatient Prescriptions  Medication Sig Dispense Refill  . desogestrel-ethinyl estradiol (KARIVA,AZURETTE,MIRCETTE) 0.15-0.02/0.01 MG (21/5) tablet Take 1 tablet by mouth daily.  1 Package  11  . FLUoxetine (PROZAC) 20 MG capsule Take 60 mg by mouth daily.       Marland Kitchen. omeprazole (PRILOSEC) 20 MG capsule Take 1 capsule (20 mg total) by mouth daily. 1 tablet a day  30 capsule  6  . HYDROcodone-acetaminophen (NORCO/VICODIN) 5-325 MG per tablet Take 1 tablet by mouth every 4 (four) hours as needed for moderate pain.  90 tablet  0  . ibuprofen (ADVIL,MOTRIN) 800 MG tablet Take 1 tablet (800 mg total) by mouth every 8 (eight) hours as needed.  30 tablet  0   No Known Allergies Active Problems:   * No active hospital problems. *  Vitals: Height 5\' 3"  (1.6 Ford), weight 104.327 kg (230 lb), last menstrual period 09/04/2013, not currently breastfeeding. Lab results:No results found for this or any previous visit (from the past 24 hour(s)). Radiology Results: No results found. General appearance: alert, cooperative and morbidly obese Head: Normocephalic, without obvious abnormality, atraumatic Eyes: negative Nose: Nares normal. Septum midline. Mucosa normal. No drainage or sinus tenderness. Throat: Impacted teeth #'s 1, 16, 17, and 32. Mallampati 4. Pharynx clear. No masses or lesions. Neck: no adenopathy, supple, symmetrical, trachea midline and thyroid not enlarged, symmetric, no tenderness/mass/nodules Resp: clear to auscultation bilaterally Cardio: regular rate and rhythm, S1, S2 normal, no murmur, click, rub or  gallop  Assessment: Impacted teeth #'s 1, 16, 17, 32.  Plan: extract teeth #'s 1, 16, 17, 32. General anesthesia. Day surgery.   Georgia LopesJENSEN,Pamela Ford 10/02/2013

## 2013-10-06 ENCOUNTER — Ambulatory Visit (HOSPITAL_BASED_OUTPATIENT_CLINIC_OR_DEPARTMENT_OTHER)
Admission: RE | Admit: 2013-10-06 | Discharge: 2013-10-06 | Disposition: A | Discharge status: Home / Self Care | Payer: Medicaid Other | Source: Ambulatory Visit | Attending: Oral Surgery | Admitting: Oral Surgery

## 2013-10-06 ENCOUNTER — Encounter (HOSPITAL_BASED_OUTPATIENT_CLINIC_OR_DEPARTMENT_OTHER): Payer: Self-pay | Admitting: Anesthesiology

## 2013-10-06 ENCOUNTER — Encounter (HOSPITAL_BASED_OUTPATIENT_CLINIC_OR_DEPARTMENT_OTHER)
Admission: RE | Disposition: A | Discharge status: Home / Self Care | Payer: Self-pay | Source: Ambulatory Visit | Attending: Oral Surgery

## 2013-10-06 ENCOUNTER — Ambulatory Visit (HOSPITAL_BASED_OUTPATIENT_CLINIC_OR_DEPARTMENT_OTHER): Payer: Medicaid Other | Admitting: Anesthesiology

## 2013-10-06 ENCOUNTER — Encounter (HOSPITAL_BASED_OUTPATIENT_CLINIC_OR_DEPARTMENT_OTHER): Payer: Medicaid Other | Admitting: Anesthesiology

## 2013-10-06 DIAGNOSIS — K006 Disturbances in tooth eruption: Secondary | ICD-10-CM | POA: Insufficient documentation

## 2013-10-06 DIAGNOSIS — K011 Impacted teeth: Secondary | ICD-10-CM

## 2013-10-06 DIAGNOSIS — M25569 Pain in unspecified knee: Secondary | ICD-10-CM

## 2013-10-06 DIAGNOSIS — F3289 Other specified depressive episodes: Secondary | ICD-10-CM | POA: Insufficient documentation

## 2013-10-06 DIAGNOSIS — F411 Generalized anxiety disorder: Secondary | ICD-10-CM | POA: Insufficient documentation

## 2013-10-06 DIAGNOSIS — E669 Obesity, unspecified: Secondary | ICD-10-CM

## 2013-10-06 DIAGNOSIS — F17200 Nicotine dependence, unspecified, uncomplicated: Secondary | ICD-10-CM

## 2013-10-06 DIAGNOSIS — M79609 Pain in unspecified limb: Secondary | ICD-10-CM | POA: Insufficient documentation

## 2013-10-06 DIAGNOSIS — F329 Major depressive disorder, single episode, unspecified: Secondary | ICD-10-CM | POA: Insufficient documentation

## 2013-10-06 DIAGNOSIS — K219 Gastro-esophageal reflux disease without esophagitis: Secondary | ICD-10-CM | POA: Insufficient documentation

## 2013-10-06 DIAGNOSIS — M23329 Other meniscus derangements, posterior horn of medial meniscus, unspecified knee: Secondary | ICD-10-CM

## 2013-10-06 HISTORY — DX: Major depressive disorder, single episode, unspecified: F32.9

## 2013-10-06 HISTORY — DX: Depression, unspecified: F32.A

## 2013-10-06 HISTORY — PX: TOOTH EXTRACTION: SHX859

## 2013-10-06 HISTORY — DX: Anxiety disorder, unspecified: F41.9

## 2013-10-06 HISTORY — DX: Pain in right leg: M79.604

## 2013-10-06 HISTORY — DX: Impacted teeth: K01.1

## 2013-10-06 LAB — POCT HEMOGLOBIN-HEMACUE: HEMOGLOBIN: 13.7 g/dL (ref 12.0–15.0)

## 2013-10-06 SURGERY — EXTRACTION, TOOTH, MOLAR
Anesthesia: General | Site: Mouth

## 2013-10-06 MED ORDER — SUCCINYLCHOLINE CHLORIDE 20 MG/ML IJ SOLN
INTRAMUSCULAR | Status: DC | PRN
  Administered 2013-10-06: 100 mg via INTRAVENOUS

## 2013-10-06 MED ORDER — OXYCODONE-ACETAMINOPHEN 10-325 MG PO TABS: 1.0000 | ORAL_TABLET | ORAL | Status: DC | PRN

## 2013-10-06 MED ORDER — CEFAZOLIN SODIUM-DEXTROSE 2-3 GM-% IV SOLR
INTRAVENOUS | Status: AC
  Filled 2013-10-06: qty 50

## 2013-10-06 MED ORDER — MIDAZOLAM HCL 2 MG/2ML IJ SOLN: 1.0000 mg | INTRAMUSCULAR | Status: DC | PRN

## 2013-10-06 MED ORDER — MIDAZOLAM HCL 2 MG/ML PO SYRP: 12.0000 mg | ORAL_SOLUTION | Freq: Once | ORAL | Status: DC | PRN

## 2013-10-06 MED ORDER — PROPOFOL 10 MG/ML IV BOLUS
INTRAVENOUS | Status: DC | PRN
  Administered 2013-10-06: 250 mg via INTRAVENOUS

## 2013-10-06 MED ORDER — CEFAZOLIN SODIUM-DEXTROSE 2-3 GM-% IV SOLR
2.0000 g | INTRAVENOUS | Status: AC
  Administered 2013-10-06: 2 g via INTRAVENOUS

## 2013-10-06 MED ORDER — BACITRACIN 500 UNIT/GM EX OINT
TOPICAL_OINTMENT | CUTANEOUS | Status: DC | PRN
  Administered 2013-10-06: 1 via TOPICAL

## 2013-10-06 MED ORDER — HYDROMORPHONE HCL PF 1 MG/ML IJ SOLN
INTRAMUSCULAR | Status: AC
  Filled 2013-10-06: qty 1

## 2013-10-06 MED ORDER — LIDOCAINE-EPINEPHRINE 2 %-1:100000 IJ SOLN
INTRAMUSCULAR | Status: DC | PRN
  Administered 2013-10-06: 18 mL via INTRADERMAL

## 2013-10-06 MED ORDER — LACTATED RINGERS IV SOLN
INTRAVENOUS | Status: DC
  Administered 2013-10-06 (×2): via INTRAVENOUS

## 2013-10-06 MED ORDER — OXYCODONE HCL 5 MG PO TABS
ORAL_TABLET | ORAL | Status: AC
  Filled 2013-10-06: qty 1

## 2013-10-06 MED ORDER — FENTANYL CITRATE 0.05 MG/ML IJ SOLN: 50.0000 ug | INTRAMUSCULAR | Status: DC | PRN

## 2013-10-06 MED ORDER — KETOROLAC TROMETHAMINE 30 MG/ML IJ SOLN: INTRAMUSCULAR | Status: DC | PRN

## 2013-10-06 MED ORDER — OXYCODONE HCL 5 MG PO TABS
5.0000 mg | ORAL_TABLET | Freq: Once | ORAL | Status: AC | PRN
  Administered 2013-10-06: 5 mg via ORAL

## 2013-10-06 MED ORDER — BACITRACIN ZINC 500 UNIT/GM EX OINT
TOPICAL_OINTMENT | CUTANEOUS | Status: AC
  Filled 2013-10-06: qty 3.6

## 2013-10-06 MED ORDER — PHENYLEPHRINE HCL 10 MG/ML IJ SOLN
INTRAMUSCULAR | Status: DC | PRN
  Administered 2013-10-06: 80 ug via INTRAVENOUS

## 2013-10-06 MED ORDER — HYDROMORPHONE HCL PF 1 MG/ML IJ SOLN
0.2500 mg | INTRAMUSCULAR | Status: DC | PRN
  Administered 2013-10-06: 0.5 mg via INTRAVENOUS

## 2013-10-06 MED ORDER — METOCLOPRAMIDE HCL 5 MG/ML IJ SOLN: 10.0000 mg | Freq: Once | INTRAMUSCULAR | Status: DC | PRN

## 2013-10-06 MED ORDER — MIDAZOLAM HCL 5 MG/5ML IJ SOLN
INTRAMUSCULAR | Status: DC | PRN
Start: 2013-10-06 — End: 2013-10-06
  Administered 2013-10-06: 2 mg via INTRAVENOUS

## 2013-10-06 MED ORDER — FENTANYL CITRATE 0.05 MG/ML IJ SOLN
INTRAMUSCULAR | Status: AC
  Filled 2013-10-06: qty 4

## 2013-10-06 MED ORDER — EPHEDRINE SULFATE 50 MG/ML IJ SOLN
INTRAMUSCULAR | Status: DC | PRN
  Administered 2013-10-06: 10 mg via INTRAVENOUS

## 2013-10-06 MED ORDER — LIDOCAINE HCL (CARDIAC) 20 MG/ML IV SOLN
INTRAVENOUS | Status: DC | PRN
  Administered 2013-10-06: 100 mg via INTRAVENOUS

## 2013-10-06 MED ORDER — FENTANYL CITRATE 0.05 MG/ML IJ SOLN
INTRAMUSCULAR | Status: DC | PRN
  Administered 2013-10-06: 100 ug via INTRAVENOUS

## 2013-10-06 MED ORDER — OXYCODONE HCL 5 MG/5ML PO SOLN: 5.0000 mg | Freq: Once | ORAL | Status: AC | PRN

## 2013-10-06 MED ORDER — DEXAMETHASONE SODIUM PHOSPHATE 4 MG/ML IJ SOLN
INTRAMUSCULAR | Status: DC | PRN
  Administered 2013-10-06: 10 mg via INTRAVENOUS

## 2013-10-06 MED ORDER — ONDANSETRON HCL 4 MG/2ML IJ SOLN
INTRAMUSCULAR | Status: DC | PRN
  Administered 2013-10-06: 4 mg via INTRAVENOUS

## 2013-10-06 MED ORDER — MIDAZOLAM HCL 2 MG/2ML IJ SOLN
INTRAMUSCULAR | Status: AC
  Filled 2013-10-06: qty 2

## 2013-10-06 MED ORDER — LIDOCAINE-EPINEPHRINE 2 %-1:100000 IJ SOLN
INTRAMUSCULAR | Status: AC
  Filled 2013-10-06: qty 8.5

## 2013-10-06 MED ORDER — SODIUM CHLORIDE 0.9 % IV SOLN
INTRAVENOUS | Status: DC | PRN
  Administered 2013-10-06: 200 mL

## 2013-10-06 SURGICAL SUPPLY — 45 items
BLADE SURG 15 STRL LF DISP TIS (BLADE) ×1 IMPLANT
BLADE SURG 15 STRL SS (BLADE) ×2
BUR CROSS CUT FISSURE 1.6 (BURR) ×2 IMPLANT
BUR CROSS CUT FISSURE 1.6MM (BURR) ×1
BUR EGG ELITE 4.0 (BURR) IMPLANT
BUR EGG ELITE 4.0MM (BURR)
BUR FAST CUTTING (BURR)
BUR SRGRND 54.5X2.4X8 (BURR) IMPLANT
BURR SRGRND 54.5X2.4X8 (BURR)
CANISTER SUCT 1200ML W/VALVE (MISCELLANEOUS) ×3 IMPLANT
CLOSURE WOUND 1/2 X4 (GAUZE/BANDAGES/DRESSINGS)
COVER MAYO STAND STRL (DRAPES) ×3 IMPLANT
COVER TABLE BACK 60X90 (DRAPES) ×3 IMPLANT
DECANTER SPIKE VIAL GLASS SM (MISCELLANEOUS) ×3 IMPLANT
DRAPE U-SHAPE 76X120 STRL (DRAPES) ×3 IMPLANT
GAUZE PACKING FOLDED 2  STR (GAUZE/BANDAGES/DRESSINGS) ×2
GAUZE PACKING FOLDED 2 STR (GAUZE/BANDAGES/DRESSINGS) ×1 IMPLANT
GAUZE PACKING IODOFORM 1/4X15 (GAUZE/BANDAGES/DRESSINGS) IMPLANT
GLOVE BIO SURGEON STRL SZ 6.5 (GLOVE) ×4 IMPLANT
GLOVE BIO SURGEON STRL SZ7.5 (GLOVE) ×3 IMPLANT
GLOVE BIO SURGEONS STRL SZ 6.5 (GLOVE) ×2
GLOVE BIOGEL PI IND STRL 7.0 (GLOVE) ×2 IMPLANT
GLOVE BIOGEL PI INDICATOR 7.0 (GLOVE) ×4
GOWN STRL REUS W/ TWL LRG LVL3 (GOWN DISPOSABLE) ×3 IMPLANT
GOWN STRL REUS W/TWL LRG LVL3 (GOWN DISPOSABLE) ×6
IV NS 500ML (IV SOLUTION)
IV NS 500ML BAXH (IV SOLUTION) IMPLANT
NEEDLE HYPO 22GX1.5 SAFETY (NEEDLE) ×3 IMPLANT
NS IRRIG 1000ML POUR BTL (IV SOLUTION) ×3 IMPLANT
PACK BASIN DAY SURGERY FS (CUSTOM PROCEDURE TRAY) ×3 IMPLANT
SLEEVE SCD COMPRESS KNEE MED (MISCELLANEOUS) ×3 IMPLANT
SPONGE SURGIFOAM ABS GEL 12-7 (HEMOSTASIS) IMPLANT
STRIP CLOSURE SKIN 1/2X4 (GAUZE/BANDAGES/DRESSINGS) IMPLANT
SUT CHROMIC 3 0 PS 2 (SUTURE) ×3 IMPLANT
SYR 20CC LL (SYRINGE) IMPLANT
SYR BULB 3OZ (MISCELLANEOUS) ×3 IMPLANT
SYR CONTROL 10ML LL (SYRINGE) ×3 IMPLANT
TOOTHBRUSH ADULT (PERSONAL CARE ITEMS) IMPLANT
TOWEL OR 17X24 6PK STRL BLUE (TOWEL DISPOSABLE) ×3 IMPLANT
TOWEL OR NON WOVEN STRL DISP B (DISPOSABLE) ×3 IMPLANT
TRAY DSU PREP LF (CUSTOM PROCEDURE TRAY) IMPLANT
TUBE CONNECTING 20'X1/4 (TUBING) ×1
TUBE CONNECTING 20X1/4 (TUBING) ×2 IMPLANT
TUBING IRRIGATION (MISCELLANEOUS) ×3 IMPLANT
YANKAUER SUCT BULB TIP NO VENT (SUCTIONS) ×3 IMPLANT

## 2013-10-06 NOTE — H&P (Signed)
H&P documentation  -History and Physical Reviewed  -Patient has been re-examined  -No change in the plan of care  Pamela Ford  

## 2013-10-06 NOTE — Anesthesia Postprocedure Evaluation (Signed)
Anesthesia Post Note  Patient: Pamela Ford  Procedure(s) Performed: Procedure(s) (LRB): EXTRACTION THIRD MOLARS (N/A)  Anesthesia type: General  Patient location: PACU  Post pain: Pain level controlled  Post assessment: [name] Cardiovascular Status Stable  Last Vitals:  Filed Vitals:   10/06/13 0915  BP: 119/73  Pulse: 104  Temp:   Resp: 18    Post vital signs: Reviewed and stable  Level of consciousness: alert  Complications: No apparent anesthesia complications

## 2013-10-06 NOTE — Addendum Note (Signed)
Addendum created 10/06/13 0957 by Burna CashJoseph C Maria Gallicchio, CRNA   Modules edited: Anesthesia Blocks and Procedures, Clinical Notes   Clinical Notes:  File: [id number]

## 2013-10-06 NOTE — Discharge Instructions (Signed)

## 2013-10-06 NOTE — Op Note (Signed)
NAMECARLOS, QUACKENBUSH NO.:  [id number]  MEDICAL RECORD NO.:  [id number]  LOCATION:                                 FACILITY:  PHYSICIAN:  Georgia Lopes, M.D.  DATE OF BIRTH:  [birth year]  DATE OF PROCEDURE:  10/06/2013 DATE OF DISCHARGE:  10/06/2013                              OPERATIVE REPORT   PREOPERATIVE DIAGNOSIS:  Impacted teeth #1, #16, #17, #32.  POSTOPERATIVE DIAGNOSIS:  Impacted teeth #1, #16, #17, #32.  PROCEDURE:  Extraction of teeth #1, #16, #17, #32.  SURGEON:  Georgia Lopes, M.D.  ANESTHESIA:  General oral intubation.  PROCEDURE:  The patient was taken to the operating room, placed on the table in supine position.  General anesthesia was administered intravenously.  Oral endotracheal tube was placed and marked.  The patient was draped for the procedure.  The eyes were protected, and then time-out was performed.  The posterior pharynx was suctioned with the Yankauer suction and a throat pack was placed.  2% lidocaine 1:100,000 epinephrine was infiltrated in an inferior alveolar block on the right and left side and buccal and palatal infiltration in the posterior maxilla.  Total of 18 mL was utilized, although some of this was wasted. Then a bite block was placed in the right side of the mouth and sweetheart retractor was used to retract the tongue.  A 15-blade was used to make a full-thickness incision overlying tooth #17 carrying anteriorly to tooth #19 on the buccal aspect.  Then in the maxilla, the 15-blade was used to make an incision around the most distal tooth, which appeared to be #16.  Tooth #15 and #16 were both interposed on the Panorex that appeared that the more distal tooth was #16 and this was the tooth that was removed.  Then the periosteal elevator was used to reflect the periosteum in the maxilla and mandible on the left side. Bone was removed from around tooth #32 and the tooth was sectioned in multiple pieces using  Stryker handpiece with a fissure bur under irrigation.  Then, the tooth was elevated and removed with a 301 elevator.  Tooth #16 was elevated with a 301 elevator and removed from the mouth with a dental forceps.  Upon removal of tooth #16, the palatal root fracture initially required removal of additional bone and use of root tip pick to remove the root tip.  When both teeth were completely removed, the sockets were curetted, irrigated, and closed with 3-0 chromic.  Then, the endotracheal tube was removed to the other side of the mouth and then sweetheart retractor and bite block were repositioned for access to the right side.  A 15-blade was used to make an incision overlying tooth #32 carrying anteriorly to tooth #30 on the buccal aspect, and then a full-thickness incision was created overlying tooth #1 in routine fashion.  The periosteum was reflected, bone was removed from around the lower tooth, and the tooth was sectioned and removed. Root tips were present in both roots and the inferior root was removed after using Stryker handpiece to remove additional bone and root tip pick was used to remove this root.  The posterior  root and more superior appeared to be more difficulty.  The lingual aspect of the mandible bone was thin in this area, and so the tooth was basically pulverized using the Stryker handpiece.  The inferior alveolar nerve was not identified or in the area of surgery in tooth #32.  Then for tooth #1, the tooth was elevated and proved difficult to remove.  Additional bone was removed after reflecting the flap further and using the Stryker handpiece.  The Potts elevator was finally used to remove this tooth. Then, the upper and lower sockets were curetted, irrigated, and closed with 3-0 chromic.  The oral cavity was irrigated and suctioned and the throat pack was removed.  The patient was awakened, taken to the recovery room, breathing spontaneously in good  condition.  ESTIMATED BLOOD LOSS:  Minimal.  COMPLICATIONS:  None.  SPECIMENS:  None.     Georgia LopesScott M. Stclair Szymborski, M.D.     SMJ/MEDQ  D:  10/06/2013  T:  10/06/2013  Job:  [id number]

## 2013-10-06 NOTE — Op Note (Signed)
10/06/2013  8:24 AM  PATIENT:  Pamela Ford  28 y.o. female  PRE-OPERATIVE DIAGNOSIS:  Impacted wisdom teeth #'s 1, 16, 17, 32  POST-OPERATIVE DIAGNOSIS:  SAME  PROCEDURE:  Procedure(s): EXTRACTION THIRD MOLARS # 1, 16, 17, 32  SURGEON:  Surgeon(s): Georgia LopesScott M Jomayra Novitsky, DDS  ANESTHESIA:   local and general  EBL:  minimal  DRAINS: none   SPECIMEN:  No Specimen  COUNTS:  YES  PLAN OF CARE: Discharge to home after PACU  PATIENT DISPOSITION:  PACU - hemodynamically stable.   PROCEDURE DETAILS: Dictation #[id number]  Georgia LopesScott M. Yaresly Menzel, DMD 10/06/2013 8:24 AM

## 2013-10-06 NOTE — Anesthesia Procedure Notes (Signed)
Procedure Name: Intubation Date/Time: 10/06/2013 7:41 AM Performed by: Burna CashONRAD, Divya Munshi C Pre-anesthesia Checklist: Patient identified, Emergency Drugs available, Suction available and Patient being monitored Patient Re-evaluated:Patient Re-evaluated prior to inductionOxygen Delivery Method: Circle System Utilized Preoxygenation: Pre-oxygenation with 100% oxygen Intubation Type: IV induction Ventilation: Mask ventilation without difficulty Laryngoscope Size: Mac and 3 Grade View: Grade I Tube type: Oral Tube size: 7.0 mm Number of attempts: 1 Airway Equipment and Method: stylet and oral airway Placement Confirmation: ETT inserted through vocal cords under direct vision,  positive ETCO2 and breath sounds checked- equal and bilateral Secured at: 22 cm Tube secured with: Tape Dental Injury: Teeth and Oropharynx as per pre-operative assessment

## 2013-10-06 NOTE — Transfer of Care (Signed)
Immediate Anesthesia Transfer of Care Note  Patient: Pamela Ford  Procedure(s) Performed: Procedure(s): EXTRACTION THIRD MOLARS (N/A)  Patient Location: PACU  Anesthesia Type:General  Level of Consciousness: awake, alert  and oriented  Airway & Oxygen Therapy: Patient Spontanous Breathing and Patient connected to face mask oxygen  Post-op Assessment: Report given to PACU RN and Post -op Vital signs reviewed and stable  Post vital signs: Reviewed and stable  Complications: No apparent anesthesia complications

## 2013-10-06 NOTE — Anesthesia Preprocedure Evaluation (Addendum)
Anesthesia Evaluation  Patient identified by MRN, date of birth, ID band Patient awake    Reviewed: Allergy & Precautions, H&P , NPO status , [name] Chart, lab work & pertinent test results, reviewed documented beta blocker date and time   History of Anesthesia Complications Negative for: history of anesthetic complications  Airway Mallampati: II TM Distance: >3 FB Neck ROM: full    Dental   Pulmonary Current Smoker,  breath sounds clear to auscultation        Cardiovascular negative cardio ROS  Rhythm:regular     Neuro/Psych PSYCHIATRIC DISORDERS Anxiety Depression negative neurological ROS     GI/Hepatic Neg liver ROS, GERD-  Medicated and Controlled,  Endo/Other  Morbid obesity  Renal/GU negative Renal ROS  negative genitourinary   Musculoskeletal   Abdominal   Peds  Hematology negative hematology ROS (+)   Anesthesia Other Findings See surgeon's H&P   Reproductive/Obstetrics negative OB ROS                           Anesthesia Physical Anesthesia Plan  ASA: III  Anesthesia Plan: General   Post-op Pain Management:    Induction: Intravenous  Airway Management Planned: Oral ETT  Additional Equipment:   Intra-op Plan:   Post-operative Plan: Extubation in OR  Informed Consent: I have reviewed the patients History and Physical, chart, labs and discussed the procedure including the risks, benefits and alternatives for the proposed anesthesia with the patient or authorized representative who has indicated his/her understanding and acceptance.   Dental Advisory Given  Plan Discussed with: CRNA and Surgeon  Anesthesia Plan Comments:         Anesthesia Quick Evaluation

## 2013-10-07 ENCOUNTER — Encounter (HOSPITAL_BASED_OUTPATIENT_CLINIC_OR_DEPARTMENT_OTHER): Payer: Self-pay | Admitting: Oral Surgery

## 2013-10-07 ENCOUNTER — Ambulatory Visit (HOSPITAL_COMMUNITY)
Admission: RE | Admit: 2013-10-07 | Discharge: 2013-10-07 | Disposition: A | Discharge status: Home / Self Care | Payer: Medicaid Other | Source: Ambulatory Visit | Attending: Orthopedic Surgery | Admitting: Orthopedic Surgery

## 2013-10-07 DIAGNOSIS — M25669 Stiffness of unspecified knee, not elsewhere classified: Secondary | ICD-10-CM | POA: Insufficient documentation

## 2013-10-07 DIAGNOSIS — R262 Difficulty in walking, not elsewhere classified: Secondary | ICD-10-CM | POA: Insufficient documentation

## 2013-10-07 DIAGNOSIS — IMO0001 Reserved for inherently not codable concepts without codable children: Secondary | ICD-10-CM | POA: Insufficient documentation

## 2013-10-07 DIAGNOSIS — M25569 Pain in unspecified knee: Secondary | ICD-10-CM | POA: Insufficient documentation

## 2013-10-07 DIAGNOSIS — M6281 Muscle weakness (generalized): Secondary | ICD-10-CM | POA: Insufficient documentation

## 2013-10-07 NOTE — Evaluation (Signed)
Physical Therapy Evaluation  Patient Details  Name: Pamela Ford MRN: [id number] Date of Birth: July 16, 1986  Today's Date: 10/07/2013 Time: [id number] PT Time Calculation (min): 25 min              Visit#: 1 of 1  Re-eval:   Assessment Diagnosis: Rt knee pain Prior Therapy: none  Authorization: medicaid    Past Medical History:  Past Medical History  Diagnosis Date  . Acid reflux   . Depression   . Anxiety   . Impacted tooth 09/2013    impacted wisdom teeth  . Leg pain, right     unknown cause, is being evaluated   Past Surgical History:  Past Surgical History  Procedure Laterality Date  . No past surgeries    . Tooth extraction N/A 10/06/2013    Procedure: EXTRACTION THIRD MOLARS;  Surgeon: Georgia LopesScott M Jensen, DDS;  Location: [facility];  Service: Oral Surgery;  Laterality: N/A;    Subjective Symptoms/Limitations Symptoms: Ms. Pamela Ford states that she has been having Rt knee pain for about two months now.  She states the more she does the more pain she has.  She states that the pain is more on the inside of her leg. The only treatment she has been givein at this time is pain medication. How long can you sit comfortably?: Pt states that after 30 minutes her leg gets stiff. How long can you stand comfortably?: Able to stand for an hour  How long can you walk comfortably?: Able to walk for 30 minutes and then she has increased pain.   Pain Assessment Currently in Pain?: Yes Pain Score: 10-Worst pain ever (Pt has no facial sign of discomfort and body is relaxed.) Pain Location: Knee Pain Orientation: Right Pain Relieving Factors: pain meds Effect of Pain on Daily Activities: activity   Prior Functioning  Prior Function Vocation: Unemployed Leisure: Hobbies-no  Assessment RLE AROM (degrees) Right Knee Extension: 3 Right Knee Flexion: 45 (note pt had knee bent to 90 degrees when sitting) RLE Strength Right Hip Flexion: 2+/5 Right Hip Extension: 3-/5 Right  Hip ABduction: 3-/5 Right Hip ADduction: 3+/5 Right Knee Flexion: 3-/5 Right Knee Extension: 3/5 (able to bend to 60 degree no resistance with mm testing.) Right Ankle Dorsiflexion: 3/5  Exercise/Treatments Pt given HEP Physical Therapy Assessment and Plan PT Assessment and Plan Clinical Impression Statement: Pt has decreased strength and ROM affecting normal daily activities.  Pt has been given a HEP as her insurance will not cover therapy.   Pt will benefit from skilled therapeutic intervention in order to improve on the following deficits: Decreased activity tolerance;Pain;Difficulty walking;Decreased strength;Decreased range of motion Rehab Potential: Fair PT Plan: Pt to be discharged to a HEP     Goals Home Exercise Program Pt/caregiver will Perform Home Exercise Program: For increased strengthening;For increased ROM PT Goal: Perform Home Exercise Program - Progress: Goal set today  Problem List Patient Active Problem List   Diagnosis Date Noted  . Stiffness of joint, not elsewhere classified, lower leg 10/07/2013  . Pain in joint, lower leg 10/07/2013  . Knee pain 08/21/2013  . Derangement of posterior horn of medial meniscus 08/21/2013  . OBESITY 09/16/2009  . GERD 09/16/2009  . Morbid obesity 05/29/2008  . NICOTINE ADDICTION 05/29/2008       GP    RUSSELL,CINDY 10/07/2013, 3:55 PM  Physician Documentation Your signature is required to indicate approval of the treatment plan as stated above.  Please sign and either send electronically  or make a copy of this report for your files and return this physician signed original.   Please mark one 1.__approve of plan  2. ___approve of plan with the following conditions.   ______________________________                                                          _____________________ Physician Signature                                                                                                             Date

## 2013-10-16 ENCOUNTER — Emergency Department (HOSPITAL_COMMUNITY)
Admission: EM | Admit: 2013-10-16 | Discharge: 2013-10-16 | Disposition: A | Discharge status: Home / Self Care | Payer: Medicaid Other | Attending: Emergency Medicine | Admitting: Emergency Medicine

## 2013-10-16 ENCOUNTER — Encounter (HOSPITAL_COMMUNITY): Payer: Self-pay | Admitting: Emergency Medicine

## 2013-10-16 DIAGNOSIS — F329 Major depressive disorder, single episode, unspecified: Secondary | ICD-10-CM | POA: Insufficient documentation

## 2013-10-16 DIAGNOSIS — O9989 Other specified diseases and conditions complicating pregnancy, childbirth and the puerperium: Secondary | ICD-10-CM | POA: Insufficient documentation

## 2013-10-16 DIAGNOSIS — R51 Headache: Secondary | ICD-10-CM | POA: Insufficient documentation

## 2013-10-16 DIAGNOSIS — O99340 Other mental disorders complicating pregnancy, unspecified trimester: Secondary | ICD-10-CM | POA: Insufficient documentation

## 2013-10-16 DIAGNOSIS — O99330 Smoking (tobacco) complicating pregnancy, unspecified trimester: Secondary | ICD-10-CM | POA: Insufficient documentation

## 2013-10-16 DIAGNOSIS — R110 Nausea: Secondary | ICD-10-CM | POA: Insufficient documentation

## 2013-10-16 DIAGNOSIS — R109 Unspecified abdominal pain: Secondary | ICD-10-CM | POA: Insufficient documentation

## 2013-10-16 DIAGNOSIS — Z3490 Encounter for supervision of normal pregnancy, unspecified, unspecified trimester: Secondary | ICD-10-CM

## 2013-10-16 DIAGNOSIS — F3289 Other specified depressive episodes: Secondary | ICD-10-CM | POA: Insufficient documentation

## 2013-10-16 DIAGNOSIS — K219 Gastro-esophageal reflux disease without esophagitis: Secondary | ICD-10-CM | POA: Insufficient documentation

## 2013-10-16 DIAGNOSIS — Z79899 Other long term (current) drug therapy: Secondary | ICD-10-CM | POA: Insufficient documentation

## 2013-10-16 DIAGNOSIS — F411 Generalized anxiety disorder: Secondary | ICD-10-CM | POA: Insufficient documentation

## 2013-10-16 LAB — URINALYSIS, ROUTINE W REFLEX MICROSCOPIC
BILIRUBIN URINE: NEGATIVE
Glucose, UA: NEGATIVE mg/dL
Hgb urine dipstick: NEGATIVE
Ketones, ur: NEGATIVE mg/dL
NITRITE: NEGATIVE
Protein, ur: NEGATIVE mg/dL
SPECIFIC GRAVITY, URINE: 1.01 (ref 1.005–1.030)
Urobilinogen, UA: 0.2 mg/dL (ref 0.0–1.0)
pH: 5.5 (ref 5.0–8.0)

## 2013-10-16 LAB — URINE MICROSCOPIC-ADD ON

## 2013-10-16 LAB — PREGNANCY, URINE: PREG TEST UR: POSITIVE — AB

## 2013-10-16 MED ORDER — PRENATAL VITAMINS 28-0.8 MG PO TABS: 1.0000 | ORAL_TABLET | Freq: Every day | ORAL | Status: DC

## 2013-10-16 NOTE — Discharge Instructions (Signed)
Return for the development of any abdominal pain or vaginal bleeding. Otherwise make an appointment to followup with your OB/GYN Dr. Zachery Conchake prenatal vitamin as directed.

## 2013-10-16 NOTE — ED Provider Notes (Signed)
CSN: [id number]     Arrival date & time 10/16/13  1530 History  This chart was scribed for Shelda Jakes, MD by Marica Otter, ED scribe.  This patient was seen in room APA08/APA08 and the patient's care was started at 4:32 PM.   Chief Complaint  Patient presents with  . Abdominal Pain    Patient is a 28 y.o. female presenting with abdominal pain. The history is provided by the patient. No language interpreter was used.  Abdominal Pain Pain location:  Suprapubic Pain quality: sharp   Pain severity:  Severe Onset quality:  Sudden Timing:  Intermittent Chronicity:  New Associated symptoms: nausea   Associated symptoms: no chest pain, no chills, no cough, no diarrhea, no dysuria, no fever, no vaginal bleeding and no vomiting     HPI Comments: Pamela Ford is a 28 y.o. female who presents to the Emergency Department complaining of intermittent abd pain in the suprapubic region onset approximately 2 weeks ago. Pt describes the pain as sharp. Pt denies any current pain, however, during episodes of pain pt states the pain is a 10 out of 10. Pt reports her last menstrual period was on 09/05/13. Pt states that she is generally regular with her menstrual periods.     Past Medical History  Diagnosis Date  . Acid reflux   . Depression   . Anxiety   . Impacted tooth 09/2013    impacted wisdom teeth  . Leg pain, right     unknown cause, is being evaluated    Past Surgical History  Procedure Laterality Date  . No past surgeries    . Tooth extraction N/A 10/06/2013    Procedure: EXTRACTION THIRD MOLARS;  Surgeon: Georgia Lopes, DDS;  Location: [facility];  Service: Oral Surgery;  Laterality: N/A;    Family History  Problem Relation Age of Onset  . Diabetes Mother   . Hypertension Mother   . Diabetes Father     History  Substance Use Topics  . Smoking status: Current Every Day Smoker -- 5 years    Types: Cigarettes  . Smokeless tobacco: Never Used     Comment:  3 cig./day  . Alcohol Use: No    OB History   Grav Para Term Preterm Abortions TAB SAB Ect Mult Living   1 1 0 1 0 0 0 0 0 1        Review of Systems  Constitutional: Negative for fever and chills.  HENT: Negative for rhinorrhea.   Eyes: Negative.   Respiratory: Negative for cough and chest tightness.   Cardiovascular: Negative for chest pain.  Gastrointestinal: Positive for nausea and abdominal pain. Negative for vomiting and diarrhea.  Genitourinary: Negative for dysuria and vaginal bleeding.  Skin: Negative.  Negative for rash.  Neurological: Positive for headaches. Negative for dizziness.  All other systems reviewed and are negative.      Allergies  Review of patient's allergies indicates no known allergies.  Home Medications   Current Outpatient Rx  Name  Route  Sig  Dispense  Refill  . desogestrel-ethinyl estradiol (KARIVA,AZURETTE,MIRCETTE) 0.15-0.02/0.01 MG (21/5) tablet   Oral   Take 1 tablet by mouth daily.         Marland Kitchen FLUoxetine (PROZAC) 20 MG capsule   Oral   Take 60 mg by mouth daily.          Marland Kitchen ibuprofen (ADVIL,MOTRIN) 800 MG tablet   Oral   Take 800 mg by mouth every 8 (  eight) hours as needed.         Marland Kitchen. omeprazole (PRILOSEC) 20 MG capsule   Oral   Take 20 mg by mouth daily.         Marland Kitchen. oxyCODONE-acetaminophen (PERCOCET) 10-325 MG per tablet   Oral   Take 1 tablet by mouth every 4 (four) hours as needed for pain.          BP 141/94  Pulse 96  Temp(Src) 98.1 F (36.7 C) (Oral)  Resp 20  Ht 5\' 3"  (1.6 m)  Wt 260 lb (117.935 kg)  BMI 46.07 kg/m2  SpO2 100%  LMP 09/05/2013  Breastfeeding? No  Physical Exam  Nursing note and vitals reviewed. Constitutional: She is oriented to person, place, and time. She appears well-developed and well-nourished. No distress.  HENT:  Head: Normocephalic and atraumatic.  Eyes: EOM are normal.  Neck: Neck supple. No tracheal deviation present.  Cardiovascular: Normal rate, regular rhythm and normal  heart sounds.   Pulmonary/Chest: Effort normal. No respiratory distress.  Abdominal: Bowel sounds are normal. There is tenderness (mild apigastric tenderness ). There is no rebound.  Musculoskeletal: Normal range of motion.  Neurological: She is alert and oriented to person, place, and time. No cranial nerve deficit. Coordination normal.  Skin: Skin is warm and dry.  Psychiatric: She has a normal mood and affect. Her behavior is normal.    ED Course  Procedures (including critical care time)  DIAGNOSTIC STUDIES: Oxygen Saturation is 100% on room air, normal by my interpretation.    COORDINATION OF CARE: 4:39 PM-Discussed treatment plan which includes UA with pt at bedside and pt agreed to plan.  5:32PM- UA was positive for pregnancy. Discussed UA results with pt and pt now denies any history of abd pain and reports that she wanted to know if she was pregnant and never experienced any abd pain.     Labs Review Labs Reviewed  URINALYSIS, ROUTINE W REFLEX MICROSCOPIC - Abnormal; Notable for the following:    Leukocytes, UA MODERATE (*)    All other components within normal limits  PREGNANCY, URINE - Abnormal; Notable for the following:    Preg Test, Ur POSITIVE (*)    All other components within normal limits  URINE MICROSCOPIC-ADD ON - Abnormal; Notable for the following:    Squamous Epithelial / LPF FEW (*)    Bacteria, UA FEW (*)    All other components within normal limits   Results for orders placed during the hospital encounter of 10/16/13  URINALYSIS, ROUTINE W REFLEX MICROSCOPIC      Result Value Ref Range   Color, Urine YELLOW  YELLOW   APPearance CLEAR  CLEAR   Specific Gravity, Urine 1.010  1.005 - 1.030   pH 5.5  5.0 - 8.0   Glucose, UA NEGATIVE  NEGATIVE mg/dL   Hgb urine dipstick NEGATIVE  NEGATIVE   Bilirubin Urine NEGATIVE  NEGATIVE   Ketones, ur NEGATIVE  NEGATIVE mg/dL   Protein, ur NEGATIVE  NEGATIVE mg/dL   Urobilinogen, UA 0.2  0.0 - 1.0 mg/dL    Nitrite NEGATIVE  NEGATIVE   Leukocytes, UA MODERATE (*) NEGATIVE  PREGNANCY, URINE      Result Value Ref Range   Preg Test, Ur POSITIVE (*) NEGATIVE  URINE MICROSCOPIC-ADD ON      Result Value Ref Range   Squamous Epithelial / LPF FEW (*) RARE   WBC, UA 7-10  <3 WBC/hpf   Bacteria, UA FEW (*) RARE  Imaging Review No results found.   EKG Interpretation None      MDM   Final diagnoses:  None    [name] pregnancy test is positive. Patient initially was complaining of suprapubic abdominal pain. What we told her pregnancy test was positive she denied any pain at all today she does want to know if she is pregnant or not. 0.2 her concern for ectopic pregnancy she stated that she has no pain no vaginal bleeding. She has a doctor your followup with. [name] urinalysis showed 7-10 whites of her patient is completely asymptomatic for UTI no dysuria at all. Patient was started on prenatal vitamins.    I personally performed the services described in this documentation, which was scribed in my presence. The recorded information has been reviewed and is accurate.     Shelda Jakes, MD 10/16/13 [phone]

## 2013-10-16 NOTE — ED Notes (Signed)
Pt reports 7/10 lower, intermittent abdominal pain. Denies n/v/d, vaginal discharge or bleeding.

## 2013-10-21 NOTE — Telephone Encounter (Signed)
Done

## 2013-10-23 ENCOUNTER — Other Ambulatory Visit: Payer: Self-pay | Admitting: Obstetrics & Gynecology

## 2013-10-23 ENCOUNTER — Ambulatory Visit: Payer: Medicaid Other | Admitting: Orthopedic Surgery

## 2013-10-23 DIAGNOSIS — O3680X0 Pregnancy with inconclusive fetal viability, not applicable or unspecified: Secondary | ICD-10-CM

## 2013-10-28 ENCOUNTER — Other Ambulatory Visit: Payer: Self-pay | Admitting: Obstetrics & Gynecology

## 2013-10-28 ENCOUNTER — Other Ambulatory Visit: Payer: Medicaid Other

## 2013-10-28 ENCOUNTER — Ambulatory Visit (INDEPENDENT_AMBULATORY_CARE_PROVIDER_SITE_OTHER): Payer: Medicaid Other

## 2013-10-28 DIAGNOSIS — O3401 Maternal care for unspecified congenital malformation of uterus, first trimester: Secondary | ICD-10-CM

## 2013-10-28 DIAGNOSIS — Q513 Bicornate uterus: Secondary | ICD-10-CM

## 2013-10-28 DIAGNOSIS — O3680X0 Pregnancy with inconclusive fetal viability, not applicable or unspecified: Secondary | ICD-10-CM

## 2013-10-28 DIAGNOSIS — O3400 Maternal care for unspecified congenital malformation of uterus, unspecified trimester: Secondary | ICD-10-CM

## 2013-10-28 NOTE — Progress Notes (Signed)
U/S-single intrauterine sac noted in Rt horn of uterus, no +YS or fetal pole noted on today's exam, GS meas c/w 5+2wks, cx appears closed, bilateral adnexa appears wnl with small amount of free fluid noted within rt adnexa, C.L. Noted on Rt, Lt ovary appears WNL, Lt horn of uterus appears WNL

## 2013-10-29 ENCOUNTER — Telehealth: Payer: Self-pay | Admitting: Adult Health

## 2013-10-29 ENCOUNTER — Ambulatory Visit (INDEPENDENT_AMBULATORY_CARE_PROVIDER_SITE_OTHER): Payer: Medicaid Other | Admitting: Women's Health

## 2013-10-29 ENCOUNTER — Encounter: Payer: Self-pay | Admitting: Women's Health

## 2013-10-29 DIAGNOSIS — O200 Threatened abortion: Secondary | ICD-10-CM

## 2013-10-29 LAB — HCG, QUANTITATIVE, PREGNANCY: hCG, Beta Chain, Quant, S: 2740.7 m[IU]/mL

## 2013-10-29 LAB — PROGESTERONE: PROGESTERONE: 3.4 ng/mL

## 2013-10-29 MED ORDER — PROGESTERONE 50 MG VA SUPP: 1.0000 | Freq: Every day | VAGINAL | Status: DC

## 2013-10-29 NOTE — Telephone Encounter (Signed)
Pt states having some vaginal bleeding, had sex on Monday night, ultrasound done yesterday in our office. Per Cyril MourningJennifer Griffin, NP pt needs to be seen. Pt informed to be here today at 10:45 am per front staff.

## 2013-10-29 NOTE — Progress Notes (Addendum)
   [facility] Visit  Patient name: Pamela Ford MRN [id number]  Date of birth: [birth year]  CC & HPI:  Pamela Citizenanika S Traweek is a 28 y.o. [id number] African American female at 7.5wks by certain LMP presenting today for report of bright red spotting and cramping x 1 day. Had sex night before bleeding began. Has not yet started pnv. Had u/s yesterday that revealed GS of 5.2wks w/ no +YS or fetal pole, hcg was 2,740, and progesterone was 3.4.   Pertinent History Reviewed:  Medical & Surgical Hx:   Past Medical History  Diagnosis Date  . Acid reflux   . Depression   . Anxiety   . Impacted tooth 09/2013    impacted wisdom teeth  . Leg pain, right     unknown cause, is being evaluated   Past Surgical History  Procedure Laterality Date  . No past surgeries    . Tooth extraction N/A 10/06/2013    Procedure: EXTRACTION THIRD MOLARS;  Surgeon: Georgia LopesScott M Jensen, DDS;  [facility];  Service: Oral Surgery;  Laterality: N/A;   Medications: Reviewed & Updated - see associated section Social History: Reviewed -  reports that she has been smoking Cigarettes.  She has been smoking about 0.00 packs per day for the past 5 years. She has never used smokeless tobacco.  Objective Findings:  Vitals: LMP 09/05/2013  Physical Examination: General appearance - alert, well appearing, and in no distress Pelvic: cx visually closed, small amount bright red blood in posterior fornix, no active bleeding from cervix, no polyps or cervical irritation  Results for orders placed in visit on 10/28/13 (from the past 24 hour(s))  HCG, QUANTITATIVE, PREGNANCY   Collection Time    10/28/13  1:55 PM      Result Value Ref Range   hCG, Beta Chain, Quant, S 2740.7    PROGESTERONE   Collection Time    10/28/13  1:55 PM      Result Value Ref Range   Progesterone 3.4      Yesterday's u/s:U/S-single intrauterine sac noted in Rt horn of uterus, no +YS or fetal pole noted on today's exam, GS meas c/w  5+2wks, cx appears closed, bilateral adnexa appears wnl with small amount of free fluid noted within rt adnexa, C.L. Noted on Rt, Lt ovary appears WNL, Lt horn of uterus appears WNL  Assessment & Plan:  A:   Threatened miscarriage  Low progesterone  Rh+ P:  Rx progesterone suppositories 50mg  q hs   F/U tomorrow am for bhcg, then 1wk for repeat u/s  Begin pnv   Cheron EveryKimberly Randall Mathius Birkeland CNM, [facility] CenterWHNP-BC 10/29/2013 11:41 AM

## 2013-10-29 NOTE — Patient Instructions (Signed)
Threatened Miscarriage  Bleeding during the first 20 weeks of pregnancy is common. This is sometimes called a threatened miscarriage. This is a pregnancy that is threatening to end before the twentieth week of pregnancy. Often this bleeding stops with bed rest or decreased activities as suggested by your caregiver and the pregnancy continues without any more problems. You may be asked to not have sexual intercourse, have orgasms or use tampons until further notice. Sometimes a threatened miscarriage can progress to a complete or incomplete miscarriage. This may or may not require further treatment. Some miscarriages occur before a woman misses a menstrual period and knows she is pregnant.  Miscarriages occur in 15 to 20% of all pregnancies and usually occur during the first 13 weeks of the pregnancy. The exact cause of a miscarriage is usually never known. A miscarriage is natures way of ending a pregnancy that is abnormal or would not make it to term. There are some things that may put you at risk to have a miscarriage, such as:  · Hormone problems.  · Infection of the uterus or cervix.  · Chronic illness, diabetes for example, especially if it is not controlled.  · Abnormal shaped uterus.  · Fibroids in the uterus.  · Incompetent cervix (the cervix is too weak to hold the baby).  · Smoking.  · Drinking too much alcohol. It's best not to drink any alcohol when you are pregnant.  · Taking illegal drugs.  TREATMENT   When a miscarriage becomes complete and all products of conception (all the tissue in the uterus) have been passed, often no treatment is needed. If you think you passed tissue, save it in a container and take it to your doctor for evaluation. If the miscarriage is incomplete (parts of the fetus or placenta remain in the uterus), further treatment may be needed. The most common reason for further treatment is continued bleeding (hemorrhage) because pregnancy tissue did not pass out of the uterus. This  often occurs if a miscarriage is incomplete. Tissue left behind may also become infected. Treatment usually is dilatation and curettage (the removal of the remaining products of pregnancy. This can be done by a simple sucking procedure (suction curettage) or a simple scraping of the inside of the uterus. This may be done in the hospital or in the caregiver's office. This is only done when your caregiver knows that there is no chance for the pregnancy to proceed to term. This is determined by physical examination, negative pregnancy test, falling pregnancy hormone count and/or, an ultrasound revealing a dead fetus.  Miscarriages are often a very emotional time for prospective mothers and fathers. This is not you or your partners fault. It did not occur because of an inadequacy in you or your partner. Nearly all miscarriages occur because the pregnancy has started off wrongly. At least half of these pregnancies have a chromosomal abnormality. It is almost always not inherited. Others may have developmental problems with the fetus or placenta. This does not always show up even when the products miscarried are studied under the microscope. The miscarriage is nearly always not your fault and it is not likely that you could have prevented it from happening. If you are having emotional and grieving problems, talk to your health care provider and even seek counseling, if necessary, before getting pregnant again. You can begin trying for another pregnancy as soon as your caregiver says it is OK.  HOME CARE INSTRUCTIONS   · Your caregiver may order   bed rest depending on how much bleeding and cramping you are having. You may be limited to only getting up to go to the bathroom. You may be allowed to continue light activity. You may need to make arrangements for the care of your other children and for any other responsibilities.  · Keep track of the number of pads you use each day, how often you have to change pads and how  saturated (soaked) they are. Record this information.  · DO NOT USE TAMPONS. Do not douche, have sexual intercourse or orgasms until approved by your caregiver.  · You may receive a follow up appointment for re-evaluation of your pregnancy and a repeat blood test. Re-evaluation often occurs after 2 days and again in 4 to 6 weeks. It is very important that you follow-up in the recommended time period.  · If you are Rh negative and the father is Rh positive or you do not know the fathers' blood type, you may receive a shot (Rh immune globulin) to help prevent abnormal antibodies that can develop and affect the baby in any future pregnancies.  SEEK IMMEDIATE MEDICAL CARE IF:  · You have severe cramps in your stomach, back, or abdomen.  · You have a sudden onset of severe pain in the lower part of your abdomen.  · You develop chills.  · You run an unexplained temperature of 101° F (38.3° C) or higher.  · You pass large clots or tissue. Save any tissue for your caregiver to inspect.  · Your bleeding increases or you become light-headed, weak, or have fainting episodes.  · You have a gush of fluid from your vagina.  · You pass out. This could mean you have a tubal (ectopic) pregnancy.  Document Released: 07/10/2005 Document Revised: 10/02/2011 Document Reviewed: 02/24/2008  ExitCare® Patient Information ©2014 [employer].

## 2013-10-30 ENCOUNTER — Other Ambulatory Visit: Payer: Self-pay | Admitting: Adult Health

## 2013-10-30 ENCOUNTER — Other Ambulatory Visit: Payer: Medicaid Other

## 2013-10-30 ENCOUNTER — Emergency Department (HOSPITAL_COMMUNITY)
Admission: EM | Admit: 2013-10-30 | Discharge: 2013-10-30 | Disposition: A | Discharge status: Home / Self Care | Payer: Medicaid Other | Attending: Emergency Medicine | Admitting: Emergency Medicine

## 2013-10-30 ENCOUNTER — Encounter (HOSPITAL_COMMUNITY): Payer: Self-pay | Admitting: Emergency Medicine

## 2013-10-30 DIAGNOSIS — F3289 Other specified depressive episodes: Secondary | ICD-10-CM | POA: Insufficient documentation

## 2013-10-30 DIAGNOSIS — F329 Major depressive disorder, single episode, unspecified: Secondary | ICD-10-CM | POA: Insufficient documentation

## 2013-10-30 DIAGNOSIS — O99330 Smoking (tobacco) complicating pregnancy, unspecified trimester: Secondary | ICD-10-CM | POA: Insufficient documentation

## 2013-10-30 DIAGNOSIS — O200 Threatened abortion: Secondary | ICD-10-CM

## 2013-10-30 DIAGNOSIS — F411 Generalized anxiety disorder: Secondary | ICD-10-CM | POA: Insufficient documentation

## 2013-10-30 DIAGNOSIS — Z79899 Other long term (current) drug therapy: Secondary | ICD-10-CM | POA: Insufficient documentation

## 2013-10-30 DIAGNOSIS — K219 Gastro-esophageal reflux disease without esophagitis: Secondary | ICD-10-CM | POA: Insufficient documentation

## 2013-10-30 MED ORDER — PROGESTERONE MICRONIZED 200 MG PO CAPS: ORAL_CAPSULE | ORAL | Status: DC

## 2013-10-30 NOTE — ED Notes (Signed)
Patient states she had an ultrasound on Monday to determine gestational age; patient states they had a hard time determining.  Patient c/o vaginal bleeding x 3 days; states small amount.

## 2013-10-30 NOTE — Discharge Instructions (Signed)
Followup with your gynecologist tomorrow Threatened Miscarriage Bleeding during the first 20 weeks of pregnancy is common. This is sometimes called a threatened miscarriage. This is a pregnancy that is threatening to end before the twentieth week of pregnancy. Often this bleeding stops with bed rest or decreased activities as suggested by your caregiver and the pregnancy continues without any more problems. You may be asked to not have sexual intercourse, have orgasms or use tampons until further notice. Sometimes a threatened miscarriage can progress to a complete or incomplete miscarriage. This may or may not require further treatment. Some miscarriages occur before a woman misses a menstrual period and knows she is pregnant. Miscarriages occur in 15 to 20% of all pregnancies and usually occur during the first 13 weeks of the pregnancy. The exact cause of a miscarriage is usually never known. A miscarriage is natures way of ending a pregnancy that is abnormal or would not make it to term. There are some things that may put you at risk to have a miscarriage, such as:  Hormone problems.  Infection of the uterus or cervix.  Chronic illness, diabetes for example, especially if it is not controlled.  Abnormal shaped uterus.  Fibroids in the uterus.  Incompetent cervix (the cervix is too weak to hold the baby).  Smoking.  Drinking too much alcohol. It's best not to drink any alcohol when you are pregnant.  Taking illegal drugs. TREATMENT  When a miscarriage becomes complete and all products of conception (all the tissue in the uterus) have been passed, often no treatment is needed. If you think you passed tissue, save it in a container and take it to your doctor for evaluation. If the miscarriage is incomplete (parts of the fetus or placenta remain in the uterus), further treatment may be needed. The most common reason for further treatment is continued bleeding (hemorrhage) because pregnancy  tissue did not pass out of the uterus. This often occurs if a miscarriage is incomplete. Tissue left behind may also become infected. Treatment usually is dilatation and curettage (the removal of the remaining products of pregnancy. This can be done by a simple sucking procedure (suction curettage) or a simple scraping of the inside of the uterus. This may be done in the hospital or in the caregiver's office. This is only done when your caregiver knows that there is no chance for the pregnancy to proceed to term. This is determined by physical examination, negative pregnancy test, falling pregnancy hormone count and/or, an ultrasound revealing a dead fetus. Miscarriages are often a very emotional time for prospective mothers and fathers. This is not you or your partners fault. It did not occur because of an inadequacy in you or your partner. Nearly all miscarriages occur because the pregnancy has started off wrongly. At least half of these pregnancies have a chromosomal abnormality. It is almost always not inherited. Others may have developmental problems with the fetus or placenta. This does not always show up even when the products miscarried are studied under the microscope. The miscarriage is nearly always not your fault and it is not likely that you could have prevented it from happening. If you are having emotional and grieving problems, talk to your health care provider and even seek counseling, if necessary, before getting pregnant again. You can begin trying for another pregnancy as soon as your caregiver says it is OK. HOME CARE INSTRUCTIONS   Your caregiver may order bed rest depending on how much bleeding and cramping you are  having. You may be limited to only getting up to go to the bathroom. You may be allowed to continue light activity. You may need to make arrangements for the care of your other children and for any other responsibilities.  Keep track of the number of pads you use each day, how  often you have to change pads and how saturated (soaked) they are. Record this information.  DO NOT USE TAMPONS. Do not douche, have sexual intercourse or orgasms until approved by your caregiver.  You may receive a follow up appointment for re-evaluation of your pregnancy and a repeat blood test. Re-evaluation often occurs after 2 days and again in 4 to 6 weeks. It is very important that you follow-up in the recommended time period.  If you are Rh negative and the father is Rh positive or you do not know the fathers' blood type, you may receive a shot (Rh immune globulin) to help prevent abnormal antibodies that can develop and affect the baby in any future pregnancies. SEEK IMMEDIATE MEDICAL CARE IF:  You have severe cramps in your stomach, back, or abdomen.  You have a sudden onset of severe pain in the lower part of your abdomen.  You develop chills.  You run an unexplained temperature of 101 F (38.3 C) or higher.  You pass large clots or tissue. Save any tissue for your caregiver to inspect.  Your bleeding increases or you become light-headed, weak, or have fainting episodes.  You have a gush of fluid from your vagina.  You pass out. This could mean you have a tubal (ectopic) pregnancy. Document Released: 07/10/2005 Document Revised: 10/02/2011 Document Reviewed: 02/24/2008 [facility] Patient Information 2014 [place], [place].

## 2013-10-30 NOTE — ED Notes (Signed)
Pt received discharge instructions, verbalized understanding and has no further questions. Pt ambulated to exit in stable condition accompanied by partner.  Advised to return to emergency department with new or worsening symptoms.

## 2013-10-30 NOTE — ED Provider Notes (Signed)
CSN: [id number]     Arrival date & time 10/30/13  2001 History   First MD Initiated Contact with Patient 10/30/13 2017     Chief Complaint  Patient presents with  . Vaginal Bleeding     (Consider location/radiation/quality/duration/timing/severity/associated sxs/prior Treatment) Patient is a 28 y.o. female presenting with vaginal bleeding. The history is provided by the patient.  Vaginal Bleeding  patient here complaining of vaginal bleeding x3 days. Seen at her physician's office on Monday and had a pelvic ultrasound which showed a 6 week IUP. She was having vaginal bleeding at that time and was diagnosed with a threatened miscarriage. States at that time her bleeding has decreased but she still has uterine cramping. Denies any dysuria or hematuria. No fever or chills. Denies any adnexal pain. She is only using a panty liner at this time. She has a followup appointment scheduled for tomorrow  Past Medical History  Diagnosis Date  . Acid reflux   . Depression   . Anxiety   . Impacted tooth 09/2013    impacted wisdom teeth  . Leg pain, right     unknown cause, is being evaluated   Past Surgical History  Procedure Laterality Date  . No past surgeries    . Tooth extraction N/A 10/06/2013    Procedure: EXTRACTION THIRD MOLARS;  Surgeon: Georgia LopesScott M Jensen, DDS;  Location: [facility];  Service: Oral Surgery;  Laterality: N/A;   Family History  Problem Relation Age of Onset  . Diabetes Mother   . Hypertension Mother   . Diabetes Father    History  Substance Use Topics  . Smoking status: Current Every Day Smoker -- 5 years    Types: Cigarettes  . Smokeless tobacco: Never Used     Comment: 3 cig./day  . Alcohol Use: No   OB History   Grav Para Term Preterm Abortions TAB SAB Ect Mult Living   2 1 0 1 0 0 0 0 0 1      Review of Systems  Genitourinary: Positive for vaginal bleeding.  All other systems reviewed and are negative.     Allergies  Review of patient's  allergies indicates no known allergies.  Home Medications   Current Outpatient Rx  Name  Route  Sig  Dispense  Refill  . desogestrel-ethinyl estradiol (KARIVA,AZURETTE,MIRCETTE) 0.15-0.02/0.01 MG (21/5) tablet   Oral   Take 1 tablet by mouth daily.         Marland Kitchen. FLUoxetine (PROZAC) 20 MG capsule   Oral   Take 60 mg by mouth daily.          Marland Kitchen. ibuprofen (ADVIL,MOTRIN) 800 MG tablet   Oral   Take 800 mg by mouth every 8 (eight) hours as needed.         Marland Kitchen. omeprazole (PRILOSEC) 20 MG capsule   Oral   Take 20 mg by mouth daily.         Marland Kitchen. oxyCODONE-acetaminophen (PERCOCET) 10-325 MG per tablet   Oral   Take 1 tablet by mouth every 4 (four) hours as needed for pain.         . Prenatal Vit-Fe Fumarate-FA (PRENATAL VITAMINS) 28-0.8 MG TABS   Oral   Take 1 tablet by mouth daily.   30 tablet   3   . progesterone (PROMETRIUM) 200 MG capsule      Put 1 capsule in vagina daily at HS   30 capsule   11   . Progesterone 50 MG SUPP  Vaginal   Place 1 suppository (50 mg total) vaginally at bedtime.   30 suppository   3    BP 121/74  Pulse 92  Temp(Src) 98.2 F (36.8 C) (Oral)  Resp 18  Ht 5\' 3"  (1.6 m)  Wt 260 lb (117.935 kg)  BMI 46.07 kg/m2  SpO2 98%  LMP 09/05/2013 Physical Exam  Nursing note and vitals reviewed. Constitutional: She is oriented to person, place, and time. She appears well-developed and well-nourished.  Non-toxic appearance. No distress.  HENT:  Head: Normocephalic and atraumatic.  Eyes: Conjunctivae, EOM and lids are normal. Pupils are equal, round, and reactive to light.  Neck: Normal range of motion. Neck supple. No tracheal deviation present. No mass present.  Cardiovascular: Normal rate, regular rhythm and normal heart sounds.  Exam reveals no gallop.   No murmur heard. Pulmonary/Chest: Effort normal and breath sounds normal. No stridor. No respiratory distress. She has no decreased breath sounds. She has no wheezes. She has no rhonchi. She  has no rales.  Abdominal: Soft. Normal appearance and bowel sounds are normal. She exhibits no distension. There is no tenderness. There is no rigidity, no rebound, no guarding and no CVA tenderness.  Genitourinary:  Cervix is long and closed with some mucousy bloody discharge.  Musculoskeletal: Normal range of motion. She exhibits no edema and no tenderness.  Neurological: She is alert and oriented to person, place, and time. She has normal strength. No cranial nerve deficit or sensory deficit. GCS eye subscore is 4. GCS verbal subscore is 5. GCS motor subscore is 6.  Skin: Skin is warm and dry. No abrasion and no rash noted.  Psychiatric: She has a normal mood and affect. Her speech is normal and behavior is normal.    ED Course  Procedures (including critical care time) Labs Review Labs Reviewed - No data to display Imaging Review No results found.   EKG Interpretation None      MDM   Final diagnoses:  None    Patient has known history of IUP by her history diagnosed 2 days ago. She has no abdominal pain. Not concern about ectopic. Cervical os is closed without fetal parts. It is possible that she could have had a missed abortion. Or this could be still a threatened abortion. Patient will followup with her gynecologist tomorrow    Toy Baker, MD 10/30/13 2046

## 2013-10-31 ENCOUNTER — Emergency Department (HOSPITAL_COMMUNITY)
Admission: EM | Admit: 2013-10-31 | Discharge: 2013-10-31 | Disposition: A | Discharge status: Home / Self Care | Payer: Medicaid Other | Attending: Emergency Medicine | Admitting: Emergency Medicine

## 2013-10-31 ENCOUNTER — Telehealth: Payer: Self-pay | Admitting: Obstetrics and Gynecology

## 2013-10-31 ENCOUNTER — Encounter (HOSPITAL_COMMUNITY): Payer: Self-pay | Admitting: Emergency Medicine

## 2013-10-31 DIAGNOSIS — K219 Gastro-esophageal reflux disease without esophagitis: Secondary | ICD-10-CM | POA: Insufficient documentation

## 2013-10-31 DIAGNOSIS — R110 Nausea: Secondary | ICD-10-CM | POA: Insufficient documentation

## 2013-10-31 DIAGNOSIS — Z8659 Personal history of other mental and behavioral disorders: Secondary | ICD-10-CM | POA: Insufficient documentation

## 2013-10-31 DIAGNOSIS — R109 Unspecified abdominal pain: Secondary | ICD-10-CM | POA: Insufficient documentation

## 2013-10-31 DIAGNOSIS — IMO0002 Reserved for concepts with insufficient information to code with codable children: Secondary | ICD-10-CM | POA: Insufficient documentation

## 2013-10-31 DIAGNOSIS — Z79899 Other long term (current) drug therapy: Secondary | ICD-10-CM | POA: Insufficient documentation

## 2013-10-31 DIAGNOSIS — O034 Incomplete spontaneous abortion without complication: Secondary | ICD-10-CM | POA: Insufficient documentation

## 2013-10-31 DIAGNOSIS — O4690 Antepartum hemorrhage, unspecified, unspecified trimester: Secondary | ICD-10-CM | POA: Insufficient documentation

## 2013-10-31 LAB — HCG, QUANTITATIVE, PREGNANCY
hCG, Beta Chain, Quant, S: 2878.8 m[IU]/mL
hCG, Beta Chain, Quant, S: 1635 m[IU]/mL — ABNORMAL HIGH (ref <5)

## 2013-10-31 MED ORDER — OXYCODONE-ACETAMINOPHEN 5-325 MG PO TABS: 1.0000 | ORAL_TABLET | ORAL | Status: DC | PRN

## 2013-10-31 MED ORDER — MISOPROSTOL 100 MCG PO TABS
600.0000 ug | ORAL_TABLET | Freq: Once | ORAL | Status: AC
  Administered 2013-10-31: 600 ug via ORAL
  Filled 2013-10-31: qty 6

## 2013-10-31 MED ORDER — OXYCODONE-ACETAMINOPHEN 5-325 MG PO TABS
2.0000 | ORAL_TABLET | Freq: Once | ORAL | Status: AC
  Administered 2013-10-31: 2 via ORAL
  Filled 2013-10-31: qty 2

## 2013-10-31 MED ORDER — IBUPROFEN 400 MG PO TABS
600.0000 mg | ORAL_TABLET | Freq: Once | ORAL | Status: AC
  Administered 2013-10-31: 600 mg via ORAL
  Filled 2013-10-31: qty 2

## 2013-10-31 NOTE — Telephone Encounter (Signed)
Spoke with pt. Quant went up but didn't double. Advised to keep appt for next week. Pt voiced understanding. JSY

## 2013-10-31 NOTE — ED Provider Notes (Signed)
CSN: [id number]     Arrival date & time 10/31/13  1739 History  This chart was scribed for Raeford Razor, MD by Shari Heritage, ED Scribe. The patient was seen in room APA12/APA12. [name] care was started at 8:21 PM.   Chief Complaint  Patient presents with  . Abdominal Pain    Patient is a 28 y.o. female presenting with abdominal pain. The history is provided by the patient. No language interpreter was used.  Abdominal Pain Pain location:  Suprapubic Pain quality: cramping   Pain radiates to:  Does not radiate Duration:  1 day Timing:  Constant Associated symptoms: nausea and vaginal bleeding   Associated symptoms: no fever   Vaginal bleeding:    Quality:  Clots   Duration:  4 days   Progression:  Improving   Chronicity:  New Risk factors: obesity and pregnancy     HPI Comments: Pamela Ford is a 28 y.o. female who presents to the Emergency Department complaining of constant vaginal bleeding onset 4 days ago and severe lower abdominal cramping that began today. She states that today she has noticed more blood clotting, but bleeding has actually improved. There is associated nausea. Patient was had an ultrasound with her OB earlier this week which showed a 5-6 week IUP. Per medical records, she was seen here yesterday complaining of vaginal bleeding. Her pelvic exam yesterday showed that her cervical os was closed without fetal parts and he suspected a missed or threatened abortion. Patient was also seen here on 10/16/13 initially complaining of suprapubic abdominal pain. She had a pregnancy test during this visit which was positive and patient was advised to follow up with her OB. She has an appointment scheduled with her OB on 11/05/13. Patient is G2P1A0. Her son is 24 year old.   Past Medical History  Diagnosis Date  . Acid reflux   . Depression   . Anxiety   . Impacted tooth 09/2013    impacted wisdom teeth  . Leg pain, right     unknown cause, is being evaluated   Past  Surgical History  Procedure Laterality Date  . No past surgeries    . Tooth extraction N/A 10/06/2013    Procedure: EXTRACTION THIRD MOLARS;  Surgeon: Georgia Lopes, DDS;  Location: [facility];  Service: Oral Surgery;  Laterality: N/A;   Family History  Problem Relation Age of Onset  . Diabetes Mother   . Hypertension Mother   . Diabetes Father    History  Substance Use Topics  . Smoking status: Current Every Day Smoker -- 5 years    Types: Cigarettes  . Smokeless tobacco: Never Used     Comment: 3 cig./day  . Alcohol Use: No   OB History   Grav Para Term Preterm Abortions TAB SAB Ect Mult Living   2 1 0 1 0 0 0 0 0 1      Review of Systems  Constitutional: Negative for fever.  Gastrointestinal: Positive for nausea and abdominal pain.  Genitourinary: Positive for vaginal bleeding.  All other systems reviewed and are negative.   Allergies  Review of patient's allergies indicates no known allergies.  Home Medications   Current Outpatient Rx  Name  Route  Sig  Dispense  Refill  . desogestrel-ethinyl estradiol (KARIVA,AZURETTE,MIRCETTE) 0.15-0.02/0.01 MG (21/5) tablet   Oral   Take 1 tablet by mouth daily.         Marland Kitchen FLUoxetine (PROZAC) 20 MG capsule   Oral  Take 60 mg by mouth daily.          Marland Kitchen. ibuprofen (ADVIL,MOTRIN) 800 MG tablet   Oral   Take 800 mg by mouth every 8 (eight) hours as needed.         Marland Kitchen. omeprazole (PRILOSEC) 20 MG capsule   Oral   Take 20 mg by mouth daily.         Marland Kitchen. oxyCODONE-acetaminophen (PERCOCET) 10-325 MG per tablet   Oral   Take 1 tablet by mouth every 4 (four) hours as needed for pain.         . Prenatal Vit-Fe Fumarate-FA (PRENATAL VITAMINS) 28-0.8 MG TABS   Oral   Take 1 tablet by mouth daily.   30 tablet   3   . progesterone (PROMETRIUM) 200 MG capsule      Put 1 capsule in vagina daily at HS   30 capsule   11   . Progesterone 50 MG SUPP   Vaginal   Place 1 suppository (50 mg total) vaginally at  bedtime.   30 suppository   3    Triage Vitals; BP 131/80  Pulse 96  Temp(Src) 98.9 F (37.2 C) (Oral)  Resp 18  Ht 5\' 3"  (1.6 m)  Wt 260 lb (117.935 kg)  BMI 46.07 kg/m2  SpO2 98%  LMP 09/05/2013 Physical Exam  Nursing note and vitals reviewed. Constitutional: She appears well-developed and well-nourished. No distress.  HENT:  Head: Normocephalic and atraumatic.  Eyes: Conjunctivae are normal. Right eye exhibits no discharge. Left eye exhibits no discharge.  Neck: Neck supple.  Cardiovascular: Normal rate, regular rhythm and normal heart sounds.  Exam reveals no gallop and no friction rub.   No murmur heard. Pulmonary/Chest: Effort normal and breath sounds normal. No respiratory distress.  Abdominal: Soft. She exhibits no distension. There is tenderness.  Minimal suprapubic tenderness.  Musculoskeletal: She exhibits no edema and no tenderness.  Neurological: She is alert.  Skin: Skin is warm and dry.  Psychiatric: She has a normal mood and affect. Her behavior is normal. Thought content normal.    ED Course  Procedures (including critical care time) DIAGNOSTIC STUDIES: Oxygen Saturation is 98% on room air, normal by my interpretation.    COORDINATION OF CARE: 8:31 PM- Patient informed of current plan for treatment and evaluation and agrees with plan at this time.     Labs Review Labs Reviewed  HCG, QUANTITATIVE, PREGNANCY - Abnormal; Notable for the following:    hCG, Beta Chain, Quant, S 1635 (*)    All other components within normal limits    Imaging Review    EKG Interpretation None      MDM   Final diagnoses:  Incomplete abortion    28 year old female with an incomplete abortion. Serial quantitative hCGs are consistent with this as well. Discussed the use of Cytotec versus expectant management. Patient is electing for medication. Discussed risk and likely increased symptoms. Discussed and followup with her OB/GYN she can. Emergent return precautions  discussed.  I personally preformed the services scribed in my presence. The recorded information has been reviewed is accurate. Raeford RazorStephen Aleena Kirkeby, MD.    Raeford RazorStephen Loie Jahr, MD 11/06/13 1300

## 2013-10-31 NOTE — ED Notes (Signed)
Seen here yesterday for threatened miscarriage, -[redacted] weeks pregnant-  Today increased pain and bleeding.

## 2013-10-31 NOTE — Discharge Instructions (Signed)
Miscarriage A miscarriage is the sudden loss of an unborn baby (fetus) before the 20th week of pregnancy. Most miscarriages happen in the first 3 months of pregnancy. Sometimes, it happens before a woman even knows she is pregnant. A miscarriage is also called a "spontaneous miscarriage" or "early pregnancy loss." Having a miscarriage can be an emotional experience. Talk with your caregiver about any questions you may have about miscarrying, the grieving process, and your future pregnancy plans. CAUSES   Problems with the fetal chromosomes that make it impossible for the baby to develop normally. Problems with the baby's genes or chromosomes are most often the result of errors that occur, by chance, as the embryo divides and grows. The problems are not inherited from the parents.  Infection of the cervix or uterus.   Hormone problems.   Problems with the cervix, such as having an incompetent cervix. This is when the tissue in the cervix is not strong enough to hold the pregnancy.   Problems with the uterus, such as an abnormally shaped uterus, uterine fibroids, or congenital abnormalities.   Certain medical conditions.   Smoking, drinking alcohol, or taking illegal drugs.   Trauma.  Often, the cause of a miscarriage is unknown.  SYMPTOMS   Vaginal bleeding or spotting, with or without cramps or pain.  Pain or cramping in the abdomen or lower back.  Passing fluid, tissue, or blood clots from the vagina. DIAGNOSIS  Your caregiver will perform a physical exam. You may also have an ultrasound to confirm the miscarriage. Blood or urine tests may also be ordered. TREATMENT   Sometimes, treatment is not necessary if you naturally pass all the fetal tissue that was in the uterus. If some of the fetus or placenta remains in the body (incomplete miscarriage), tissue left behind may become infected and must be removed. Usually, a dilation and curettage (D and C) procedure is performed.  During a D and C procedure, the cervix is widened (dilated) and any remaining fetal or placental tissue is gently removed from the uterus.  Antibiotic medicines are prescribed if there is an infection. Other medicines may be given to reduce the size of the uterus (contract) if there is a lot of bleeding.  If you have Rh negative blood and your baby was Rh positive, you will need a Rh immunoglobulin shot. This shot will protect any future baby from having Rh blood problems in future pregnancies. HOME CARE INSTRUCTIONS   Your caregiver may order bed rest or may allow you to continue light activity. Resume activity as directed by your caregiver.  Have someone help with home and family responsibilities during this time.   Keep track of the number of sanitary pads you use each day and how soaked (saturated) they are. Write down this information.   Do not use tampons. Do not douche or have sexual intercourse until approved by your caregiver.   Only take over-the-counter or prescription medicines for pain or discomfort as directed by your caregiver.   Do not take aspirin. Aspirin can cause bleeding.   Keep all follow-up appointments with your caregiver.   If you or your partner have problems with grieving, talk to your caregiver or seek counseling to help cope with the pregnancy loss. Allow enough time to grieve before trying to get pregnant again.  SEEK IMMEDIATE MEDICAL CARE IF:   You have severe cramps or pain in your back or abdomen.  You have a fever.  You pass large blood clots (walnut-sized   or larger) ortissue from your vagina. Save any tissue for your caregiver to inspect.   Your bleeding increases.   You have a thick, bad-smelling vaginal discharge.  You become lightheaded, weak, or you faint.   You have chills.  MAKE SURE YOU:  Understand these instructions.  Will watch your condition.  Will get help right away if you are not doing well or get  worse. Document Released: 01/03/2001 Document Revised: 11/04/2012 Document Reviewed: 08/29/2011 Exit[employer] Patient Information 2014 [employer].  

## 2013-11-04 ENCOUNTER — Emergency Department (HOSPITAL_COMMUNITY): Payer: Medicaid Other

## 2013-11-04 ENCOUNTER — Emergency Department (HOSPITAL_COMMUNITY)
Admission: EM | Admit: 2013-11-04 | Discharge: 2013-11-04 | Disposition: A | Discharge status: Home / Self Care | Payer: Medicaid Other | Attending: Emergency Medicine | Admitting: Emergency Medicine

## 2013-11-04 ENCOUNTER — Encounter (HOSPITAL_COMMUNITY): Payer: Self-pay | Admitting: Emergency Medicine

## 2013-11-04 DIAGNOSIS — Z8719 Personal history of other diseases of the digestive system: Secondary | ICD-10-CM | POA: Insufficient documentation

## 2013-11-04 DIAGNOSIS — R109 Unspecified abdominal pain: Secondary | ICD-10-CM

## 2013-11-04 DIAGNOSIS — O99340 Other mental disorders complicating pregnancy, unspecified trimester: Secondary | ICD-10-CM | POA: Insufficient documentation

## 2013-11-04 DIAGNOSIS — O039 Complete or unspecified spontaneous abortion without complication: Secondary | ICD-10-CM

## 2013-11-04 DIAGNOSIS — F3289 Other specified depressive episodes: Secondary | ICD-10-CM | POA: Insufficient documentation

## 2013-11-04 DIAGNOSIS — F329 Major depressive disorder, single episode, unspecified: Secondary | ICD-10-CM | POA: Insufficient documentation

## 2013-11-04 DIAGNOSIS — F411 Generalized anxiety disorder: Secondary | ICD-10-CM | POA: Insufficient documentation

## 2013-11-04 DIAGNOSIS — O99330 Smoking (tobacco) complicating pregnancy, unspecified trimester: Secondary | ICD-10-CM | POA: Insufficient documentation

## 2013-11-04 LAB — URINALYSIS, ROUTINE W REFLEX MICROSCOPIC
BILIRUBIN URINE: NEGATIVE
Glucose, UA: NEGATIVE mg/dL
KETONES UR: NEGATIVE mg/dL
NITRITE: NEGATIVE
PROTEIN: NEGATIVE mg/dL
Specific Gravity, Urine: 1.02 (ref 1.005–1.030)
Urobilinogen, UA: 0.2 mg/dL (ref 0.0–1.0)
pH: 6 (ref 5.0–8.0)

## 2013-11-04 LAB — CBC WITH DIFFERENTIAL/PLATELET
BASOS ABS: 0 10*3/uL (ref 0.0–0.1)
Basophils Relative: 0 % (ref 0–1)
Eosinophils Absolute: 0 10*3/uL (ref 0.0–0.7)
HEMATOCRIT: 38.6 % (ref 36.0–46.0)
Hemoglobin: 13.2 g/dL (ref 12.0–15.0)
LYMPHS ABS: 1.6 10*3/uL (ref 0.7–4.0)
MCH: 31.5 pg (ref 26.0–34.0)
MCHC: 34.2 g/dL (ref 30.0–36.0)
MCV: 92.1 fL (ref 78.0–100.0)
MONO ABS: 0.3 10*3/uL (ref 0.1–1.0)
Monocytes Relative: 4 % (ref 3–12)
Neutro Abs: 6.7 10*3/uL (ref 1.7–7.7)
Neutrophils Relative %: 77 % (ref 43–77)
PLATELETS: 237 10*3/uL (ref 150–400)
RBC: 4.19 MIL/uL (ref 3.87–5.11)
RDW: 13.5 % (ref 11.5–15.5)
WBC: 8.6 10*3/uL (ref 4.0–10.5)

## 2013-11-04 LAB — URINE MICROSCOPIC-ADD ON

## 2013-11-04 LAB — LIPASE, BLOOD: Lipase: 27 U/L (ref 11–59)

## 2013-11-04 LAB — COMPREHENSIVE METABOLIC PANEL
ALT: 16 U/L (ref 0–35)
AST: 18 U/L (ref 0–37)
Albumin: 3.7 g/dL (ref 3.5–5.2)
Alkaline Phosphatase: 101 U/L (ref 39–117)
BILIRUBIN TOTAL: 0.2 mg/dL — AB (ref 0.3–1.2)
BUN: 4 mg/dL — ABNORMAL LOW (ref 6–23)
CHLORIDE: 105 meq/L (ref 96–112)
CO2: 27 mEq/L (ref 19–32)
Calcium: 9.3 mg/dL (ref 8.4–10.5)
Creatinine, Ser: 0.86 mg/dL (ref 0.50–1.10)
GFR calc Af Amer: >90 mL/min (ref >90)
GFR calc non Af Amer: >90 mL/min (ref >90)
GLUCOSE: 113 mg/dL — AB (ref 70–99)
Potassium: 4 mEq/L (ref 3.7–5.3)
SODIUM: 144 meq/L (ref 137–147)
Total Protein: 7.9 g/dL (ref 6.0–8.3)

## 2013-11-04 LAB — HCG, QUANTITATIVE, PREGNANCY: HCG, BETA CHAIN, QUANT, S: 65 m[IU]/mL — AB (ref <5)

## 2013-11-04 MED ORDER — HYDROCODONE-ACETAMINOPHEN 5-325 MG PO TABS: 2.0000 | ORAL_TABLET | Freq: Four times a day (QID) | ORAL | Status: DC | PRN

## 2013-11-04 MED ORDER — FENTANYL CITRATE 0.05 MG/ML IJ SOLN
100.0000 ug | Freq: Once | INTRAMUSCULAR | Status: AC
  Administered 2013-11-04: 100 ug via INTRAVENOUS
  Filled 2013-11-04: qty 2

## 2013-11-04 MED ORDER — SODIUM CHLORIDE 0.9 % IV BOLUS (SEPSIS)
1000.0000 mL | Freq: Once | INTRAVENOUS | Status: AC
  Administered 2013-11-04: 1000 mL via INTRAVENOUS

## 2013-11-04 MED ORDER — ONDANSETRON HCL 4 MG/2ML IJ SOLN
4.0000 mg | Freq: Once | INTRAMUSCULAR | Status: AC
  Administered 2013-11-04: 4 mg via INTRAVENOUS
  Filled 2013-11-04: qty 2

## 2013-11-04 MED ORDER — HYDROCODONE-ACETAMINOPHEN 5-325 MG PO TABS
2.0000 | ORAL_TABLET | Freq: Once | ORAL | Status: AC
  Administered 2013-11-04: 2 via ORAL
  Filled 2013-11-04: qty 2

## 2013-11-04 NOTE — ED Notes (Signed)
Discharge instructions and prescription given and reviewed with patient.  Patient verbalized understanding of sedating effects of medication and to follow up with Dr. Emelda FearFerguson for further management.

## 2013-11-04 NOTE — ED Notes (Signed)
Patient requesting additional pain medication.  Dr. Fonnie JarvisBednar aware and no orders received.

## 2013-11-04 NOTE — ED Notes (Addendum)
Patient brought in via EMS. Patient alert and oriented. Airway patent. Patient c/o nausea, vomiting, and left lower abd pain. Patient seen here on 4/10 for threatened miscarriage at 5 weeks, but did not follow-up OB/GYN. Patient reports having had miscarriage after been seen here. Patient reports vaginal bleeding, using 2 sanitary pads per hour.

## 2013-11-04 NOTE — Discharge Instructions (Signed)

## 2013-11-04 NOTE — ED Provider Notes (Signed)
CSN: [id number]     Arrival date & time 11/04/13  1358 History   None   This chart was scribed for No att. providers found by Marica OtterNusrat Rahman, ED Scribe. This patient was seen in room APOTF/OTF and the patient's care was started at 5:08 PM.  Chief Complaint  Patient presents with  . Abdominal Pain  . Emesis   HPI HPI Comments: Pamela Ford is a 28 y.o. female brought in by ambulance, who presents to the Emergency Department complaining of frequent non-bloody vomiting (pt reports she vomits 2-3 X a day) with associated: (1) vaginal bleeding (3 pads per day); (2) constant, sharp pain across lower abd pain; and (3) nausea. All symptoms began a few weeks ago. Pt also reports that she was seen at the ED on 10/30/13 for a threatened miscarriage; pt reports she had a miscarriage since said ED visit. Pt denies any abd operations, chronic abd pain, dysuria, vaginal discharge. G2P1A1  Pt reports her husband will be picking her up from the ED.   Past Medical History  Diagnosis Date  . Acid reflux   . Depression   . Anxiety   . Impacted tooth 09/2013    impacted wisdom teeth  . Leg pain, right     unknown cause, is being evaluated   Past Surgical History  Procedure Laterality Date  . No past surgeries    . Tooth extraction N/A 10/06/2013    Procedure: EXTRACTION THIRD MOLARS;  Surgeon: Georgia LopesScott M Jensen, DDS;  Location: [facility];  Service: Oral Surgery;  Laterality: N/A;   Family History  Problem Relation Age of Onset  . Diabetes Mother   . Hypertension Mother   . Diabetes Father    History  Substance Use Topics  . Smoking status: Current Every Day Smoker -- 0.03 packs/day for 5 years    Types: Cigarettes  . Smokeless tobacco: Never Used     Comment: 3 cig./day  . Alcohol Use: No   OB History   Grav Para Term Preterm Abortions TAB SAB Ect Mult Living   2 1 0 1 1 0 1 0 0 1      Review of Systems  Gastrointestinal: Positive for nausea, vomiting and abdominal pain (lower  abd ).  Genitourinary: Positive for vaginal bleeding. Negative for dysuria and vaginal discharge.    10 Systems reviewed and all are negative for acute change except as noted in the HPI.  Allergies  Review of patient's allergies indicates no known allergies.  Home Medications   Prior to Admission medications   Medication Sig Start Date End Date Taking? Authorizing Provider  FLUoxetine (PROZAC) 20 MG capsule Take 60 mg by mouth daily.     Historical Provider, MD  oxyCODONE-acetaminophen (PERCOCET/ROXICET) 5-325 MG per tablet Take 1-2 tablets by mouth every 4 (four) hours as needed for severe pain. 10/31/13   Raeford RazorStephen Kohut, MD  progesterone (PROMETRIUM) 200 MG capsule Put 1 capsule in vagina daily at [facility] 10/30/13   Adline PotterJennifer A Griffin, NP  Progesterone 50 MG SUPP Place 1 suppository (50 mg total) vaginally at bedtime. 10/29/13   Marge DuncansKimberly Randall Booker, CNM   Triage Vitals: BP 132/73  Pulse 89  Temp(Src) 98.3 F (36.8 C) (Oral)  Resp 18  Ht 5\' 3"  (1.6 m)  Wt 260 lb (117.935 kg)  BMI 46.07 kg/m2  SpO2 95%  LMP 11/04/2013  Breastfeeding? Unknown Physical Exam  Nursing note and vitals reviewed. Constitutional:  Awake, alert, nontoxic appearance.  HENT:  Head:  Atraumatic.  Eyes: Right eye exhibits no discharge. Left eye exhibits no discharge.  Neck: Neck supple.  Cardiovascular: Normal rate and regular rhythm.   No murmur heard. Pulmonary/Chest: Effort normal. She exhibits no tenderness.  Abdominal: Soft. Bowel sounds are normal. She exhibits no mass. There is tenderness (soft mild lower abd tenderness no upper abd tenderness). There is no rebound.  Genitourinary:  Bimanual exam with chaperone present: scant blood on examination gloves. No discharge. Cervix closed, no cervical motion tenderness, no palpable masses, no adnexal tenderness.   Musculoskeletal: She exhibits no edema and no tenderness.  Baseline ROM, no obvious new focal weakness.  Neurological:  Mental status and motor  strength appears baseline for patient and situation.  Skin: No rash noted.  Psychiatric: She has a normal mood and affect.    ED Course  Procedures (including critical care time) DIAGNOSTIC STUDIES: Oxygen Saturation is 95% on RA, adequate by my interpretation.    COORDINATION OF CARE: 5:20 PM-Discussed treatment plan which includes bimanual exam, ultrasound, UA, meds and labs with pt at bedside and pt agreed to plan.  Patient informed of clinical course, understand medical decision-making process, and agree with plan. Labs Review Labs Reviewed  COMPREHENSIVE METABOLIC PANEL - Abnormal; Notable for the following:    Glucose, Bld 113 (*)    BUN 4 (*)    Total Bilirubin 0.2 (*)    All other components within normal limits  URINALYSIS, ROUTINE W REFLEX MICROSCOPIC - Abnormal; Notable for the following:    Hgb urine dipstick LARGE (*)    Leukocytes, UA SMALL (*)    All other components within normal limits  HCG, QUANTITATIVE, PREGNANCY - Abnormal; Notable for the following:    hCG, Beta Chain, Quant, S 65 (*)    All other components within normal limits  URINE MICROSCOPIC-ADD ON - Abnormal; Notable for the following:    Squamous Epithelial / LPF FEW (*)    Bacteria, UA FEW (*)    All other components within normal limits  CBC WITH DIFFERENTIAL  LIPASE, BLOOD    Imaging Review No results found.   EKG Interpretation None      MDM   Final diagnoses:  Abdominal pain  Miscarriage    I doubt any other EMC precluding discharge at this time including, but not necessarily limited to the following:septic abortion.  I personally performed the services described in this documentation, which was scribed in my presence. The recorded information has been reviewed and is accurate.    Hurman HornJohn M Shandelle Borrelli, MD 11/09/13 [phone]

## 2013-11-05 ENCOUNTER — Ambulatory Visit (INDEPENDENT_AMBULATORY_CARE_PROVIDER_SITE_OTHER): Payer: Medicaid Other | Admitting: Advanced Practice Midwife

## 2013-11-05 ENCOUNTER — Encounter (HOSPITAL_COMMUNITY): Payer: Self-pay | Admitting: Emergency Medicine

## 2013-11-05 ENCOUNTER — Emergency Department (HOSPITAL_COMMUNITY)
Admission: EM | Admit: 2013-11-05 | Discharge: 2013-11-06 | Disposition: A | Discharge status: Home / Self Care | Payer: Medicaid Other | Attending: Emergency Medicine | Admitting: Emergency Medicine

## 2013-11-05 ENCOUNTER — Encounter: Payer: Self-pay | Admitting: Advanced Practice Midwife

## 2013-11-05 VITALS — BP 140/98 | Ht 63.0 in | Wt 259.0 lb

## 2013-11-05 DIAGNOSIS — R109 Unspecified abdominal pain: Secondary | ICD-10-CM

## 2013-11-05 DIAGNOSIS — O219 Vomiting of pregnancy, unspecified: Secondary | ICD-10-CM | POA: Insufficient documentation

## 2013-11-05 DIAGNOSIS — O99330 Smoking (tobacco) complicating pregnancy, unspecified trimester: Secondary | ICD-10-CM | POA: Insufficient documentation

## 2013-11-05 DIAGNOSIS — F411 Generalized anxiety disorder: Secondary | ICD-10-CM | POA: Insufficient documentation

## 2013-11-05 DIAGNOSIS — F329 Major depressive disorder, single episode, unspecified: Secondary | ICD-10-CM | POA: Insufficient documentation

## 2013-11-05 DIAGNOSIS — R112 Nausea with vomiting, unspecified: Secondary | ICD-10-CM

## 2013-11-05 DIAGNOSIS — R1032 Left lower quadrant pain: Secondary | ICD-10-CM | POA: Insufficient documentation

## 2013-11-05 DIAGNOSIS — Z8719 Personal history of other diseases of the digestive system: Secondary | ICD-10-CM | POA: Insufficient documentation

## 2013-11-05 DIAGNOSIS — F3289 Other specified depressive episodes: Secondary | ICD-10-CM | POA: Insufficient documentation

## 2013-11-05 DIAGNOSIS — Z79899 Other long term (current) drug therapy: Secondary | ICD-10-CM | POA: Insufficient documentation

## 2013-11-05 DIAGNOSIS — Q513 Bicornate uterus: Secondary | ICD-10-CM | POA: Insufficient documentation

## 2013-11-05 DIAGNOSIS — O99340 Other mental disorders complicating pregnancy, unspecified trimester: Secondary | ICD-10-CM | POA: Insufficient documentation

## 2013-11-05 DIAGNOSIS — O039 Complete or unspecified spontaneous abortion without complication: Secondary | ICD-10-CM

## 2013-11-05 DIAGNOSIS — A599 Trichomoniasis, unspecified: Secondary | ICD-10-CM

## 2013-11-05 MED ORDER — PRENATAL VITAMINS 0.8 MG PO TABS: 1.0000 | ORAL_TABLET | Freq: Every day | ORAL | Status: DC

## 2013-11-05 MED ORDER — POLYETHYLENE GLYCOL 3350 17 G PO PACK: 17.0000 g | PACK | Freq: Every day | ORAL | Status: DC

## 2013-11-05 MED ORDER — ONDANSETRON 4 MG PO TBDP: 4.0000 mg | ORAL_TABLET | Freq: Four times a day (QID) | ORAL | Status: DC | PRN

## 2013-11-05 MED ORDER — METRONIDAZOLE 500 MG PO TABS: 2000.0000 mg | ORAL_TABLET | Freq: Once | ORAL | Status: DC

## 2013-11-05 NOTE — Patient Instructions (Signed)
Trichomoniasis °Trichomoniasis is an infection, caused by the Trichomonas organism, that affects both women and men. In women, the outer female genitalia and the vagina are affected. In men, the penis is mainly affected, but the prostate and other reproductive organs can also be involved. Trichomoniasis is a sexually transmitted disease (STD) and is most often passed to another person through sexual contact. The majority of people who get trichomoniasis do so from a sexual encounter and are also at risk for other STDs. °CAUSES  °· Sexual intercourse with an infected partner. °· It can be present in swimming pools or hot tubs. °SYMPTOMS  °· Abnormal gray-green frothy vaginal discharge in women. °· Vaginal itching and irritation in women. °· Itching and irritation of the area outside the vagina in women. °· Penile discharge with or without pain in males. °· Inflammation of the urethra (urethritis), causing painful urination. °· Bleeding after sexual intercourse. °RELATED COMPLICATIONS °· Pelvic inflammatory disease. °· Infection of the uterus (endometritis). °· Infertility. °· Tubal (ectopic) pregnancy. °· It can be associated with other STDs, including gonorrhea and chlamydia, hepatitis B, and HIV. °COMPLICATIONS DURING PREGNANCY °· Early (premature) delivery. °· Premature rupture of the membranes (PROM). °· Low birth weight. °DIAGNOSIS  °· Visualization of Trichomonas under the microscope from the vagina discharge. °· Ph of the vagina greater than 4.5, tested with a test tape. °· Trich Rapid Test. °· Culture of the organism, but this is not usually needed. °· It may be found on a Pap test. °· Having a "strawberry cervix,"which means the cervix looks very red like a strawberry. °TREATMENT  °· You may be given medication to fight the infection. Inform your caregiver if you could be or are pregnant. Some medications used to treat the infection should not be taken during pregnancy. °· Over-the-counter medications or  creams to decrease itching or irritation may be recommended. °· Your sexual partner will need to be treated if infected. °HOME CARE INSTRUCTIONS  °· Take all medication prescribed by your caregiver. °· Take over-the-counter medication for itching or irritation as directed by your caregiver. °· Do not have sexual intercourse while you have the infection. °· Do not douche or wear tampons. °· Discuss your infection with your partner, as your partner may have acquired the infection from you. Or, your partner may have been the person who transmitted the infection to you. °· Have your sex partner examined and treated if necessary. °· Practice safe, informed, and protected sex. °· See your caregiver for other STD testing. °SEEK MEDICAL CARE IF:  °· You still have symptoms after you finish the medication. °· You have an oral temperature above 102° F (38.9° C). °· You develop belly (abdominal) pain. °· You have pain when you urinate. °· You have bleeding after sexual intercourse. °· You develop a rash. °· The medication makes you sick or makes you throw up (vomit). °Document Released: 01/03/2001 Document Revised: 10/02/2011 Document Reviewed: 01/29/2009 °Exit[employer]® Patient Information ©2014 [employer]. ° °

## 2013-11-05 NOTE — Progress Notes (Signed)
Pamela Ford 28 y.o.  Filed Vitals:   11/05/13 1509  BP: 140/98   Past Medical History  Diagnosis Date  . Acid reflux   . Depression   . Anxiety   . Impacted tooth 09/2013    impacted wisdom teeth  . Leg pain, right     unknown cause, is being evaluated   Past Surgical History  Procedure Laterality Date  . No past surgeries    . Tooth extraction N/A 10/06/2013    Procedure: EXTRACTION THIRD MOLARS;  Surgeon: Georgia LopesScott M Jensen, DDS;  Location: [facility];  Service: Oral Surgery;  Laterality: N/A;   family history includes Diabetes in her father and mother; Hypertension in her mother. Current outpatient prescriptions:FLUoxetine (PROZAC) 20 MG capsule, Take 60 mg by mouth daily. , Disp: , Rfl: ;  HYDROcodone-acetaminophen (NORCO) 5-325 MG per tablet, Take 2 tablets by mouth every 6 (six) hours as needed for severe pain., Disp: 6 tablet, Rfl: 0;  metroNIDAZOLE (FLAGYL) 500 MG tablet, Take 4 tablets (2,000 mg total) by mouth once., Disp: 4 tablet, Rfl: 1 ondansetron (ZOFRAN ODT) 4 MG disintegrating tablet, Take 1 tablet (4 mg total) by mouth every 6 (six) hours as needed for nausea., Disp: 20 tablet, Rfl: 0;  oxyCODONE-acetaminophen (PERCOCET/ROXICET) 5-325 MG per tablet, Take 1-2 tablets by mouth every 4 (four) hours as needed for severe pain., Disp: 6 tablet, Rfl: 0;  polyethylene glycol (MIRALAX / GLYCOLAX) packet, Take 17 g by mouth daily., Disp: 14 each, Rfl: 0 Prenatal Multivit-Min-Fe-FA (PRENATAL VITAMINS) 0.8 MG tablet, Take 1 tablet by mouth daily., Disp: 30 tablet, Rfl: 12;  progesterone (PROMETRIUM) 200 MG capsule, Put 1 capsule in vagina daily at HS, Disp: 30 capsule, Rfl: 11  HPI:   Had SAB starting 11/01/13. Has been back and forth to the ED for nausea, bleeding, LLQ pain.  Last seen 11/04/13.  Had trichomonas on a u/a, untreated.  ED provider notes incomplete on 10/31/13 and 11/04/13 visit, so unsure of final dx.  Pt is not bleeding anymore today.  Still has some pain  along the left flank, exacerbated by movement.  Nausea is better today.  Has not had a BM in 4 days. Wants to get pregnant again soon PE:  Abdomen soft, nontender.  No bleeding.  Quant 65 yesterday, falling appropriately.    ASSESSMENT: S/P SAB, normal findings Constipation Trichomonas   PLAN:  Treat trich pt and partner Condoms 6 weeks QHCG 1 week Treat constipation PNV  Meds ordered this encounter  Medications  . metroNIDAZOLE (FLAGYL) 500 MG tablet    Sig: Take 4 tablets (2,000 mg total) by mouth once.    Dispense:  4 tablet    Refill:  1    Order Specific Question:  Supervising Provider    Answer:  EURE, LUTHER H [[id number]]  . ondansetron (ZOFRAN ODT) 4 MG disintegrating tablet    Sig: Take 1 tablet (4 mg total) by mouth every 6 (six) hours as needed for nausea.    Dispense:  20 tablet    Refill:  0    Order Specific Question:  Supervising Provider    Answer:  Despina HiddenEURE, LUTHER H [[id number]]  . Prenatal Multivit-Min-Fe-FA (PRENATAL VITAMINS) 0.8 MG tablet    Sig: Take 1 tablet by mouth daily.    Dispense:  30 tablet    Refill:  12    Order Specific Question:  Supervising Provider    Answer:  Despina HiddenEURE, LUTHER H [[id number]]  . polyethylene glycol (MIRALAX /  GLYCOLAX) packet    Sig: Take 17 g by mouth daily.    Dispense:  14 each    Refill:  0    Order Specific Question:  Supervising Provider    Answer:  Duane LopeEURE, LUTHER H [[id number]]

## 2013-11-05 NOTE — ED Notes (Addendum)
Pt states she can't keep anything down x 1 day. Pt thinks she is dehydrated. Pt says she hurts in her left ovary.

## 2013-11-06 LAB — PREGNANCY, URINE: PREG TEST UR: POSITIVE — AB

## 2013-11-06 LAB — CBC WITH DIFFERENTIAL/PLATELET
BASOS ABS: 0 10*3/uL (ref 0.0–0.1)
BASOS PCT: 0 % (ref 0–1)
Eosinophils Absolute: 0.1 10*3/uL (ref 0.0–0.7)
Eosinophils Relative: 1 % (ref 0–5)
HCT: 36 % (ref 36.0–46.0)
Hemoglobin: 12.5 g/dL (ref 12.0–15.0)
Lymphocytes Relative: 33 % (ref 12–46)
Lymphs Abs: 2.6 10*3/uL (ref 0.7–4.0)
MCH: 31.8 pg (ref 26.0–34.0)
MCHC: 34.7 g/dL (ref 30.0–36.0)
MCV: 91.6 fL (ref 78.0–100.0)
MONOS PCT: 5 % (ref 3–12)
Monocytes Absolute: 0.4 10*3/uL (ref 0.1–1.0)
NEUTROS PCT: 61 % (ref 43–77)
Neutro Abs: 4.7 10*3/uL (ref 1.7–7.7)
Platelets: 224 10*3/uL (ref 150–400)
RBC: 3.93 MIL/uL (ref 3.87–5.11)
RDW: 13.4 % (ref 11.5–15.5)
WBC: 7.8 10*3/uL (ref 4.0–10.5)

## 2013-11-06 LAB — URINALYSIS, ROUTINE W REFLEX MICROSCOPIC
Bilirubin Urine: NEGATIVE
GLUCOSE, UA: NEGATIVE mg/dL
KETONES UR: NEGATIVE mg/dL
Nitrite: NEGATIVE
PROTEIN: NEGATIVE mg/dL
Specific Gravity, Urine: <1.005 — ABNORMAL LOW (ref 1.005–1.030)
UROBILINOGEN UA: 0.2 mg/dL (ref 0.0–1.0)
pH: 6 (ref 5.0–8.0)

## 2013-11-06 LAB — URINE MICROSCOPIC-ADD ON

## 2013-11-06 LAB — HCG, QUANTITATIVE, PREGNANCY: hCG, Beta Chain, Quant, S: 29 m[IU]/mL — ABNORMAL HIGH (ref <5)

## 2013-11-06 MED ORDER — NAPROXEN 500 MG PO TABS: 500.0000 mg | ORAL_TABLET | Freq: Two times a day (BID) | ORAL | Status: DC

## 2013-11-06 MED ORDER — SODIUM CHLORIDE 0.9 % IV BOLUS (SEPSIS)
1000.0000 mL | Freq: Once | INTRAVENOUS | Status: AC
  Administered 2013-11-06: 1000 mL via INTRAVENOUS

## 2013-11-06 MED ORDER — KETOROLAC TROMETHAMINE 30 MG/ML IJ SOLN
30.0000 mg | Freq: Once | INTRAMUSCULAR | Status: AC
  Administered 2013-11-06: 30 mg via INTRAVENOUS
  Filled 2013-11-06: qty 1

## 2013-11-06 MED ORDER — TRAMADOL HCL 50 MG PO TABS: 50.0000 mg | ORAL_TABLET | Freq: Four times a day (QID) | ORAL | Status: DC | PRN

## 2013-11-06 MED ORDER — ONDANSETRON HCL 4 MG/2ML IJ SOLN
4.0000 mg | Freq: Once | INTRAMUSCULAR | Status: AC
  Administered 2013-11-06: 4 mg via INTRAVENOUS
  Filled 2013-11-06: qty 2

## 2013-11-06 NOTE — Discharge Instructions (Signed)
Please call your doctor for a followup appointment within 24-48 hours. When you talk to your doctor please let them know that you were seen in the emergency department and have them acquire all of your records so that they can discuss the findings with you and formulate a treatment plan to fully care for your new and ongoing problems. ° °

## 2013-11-06 NOTE — ED Provider Notes (Signed)
CSN: [id number]     Arrival date & time 11/05/13  2124 History   First MD Initiated Contact with Patient 11/06/13 0007     Chief Complaint  Patient presents with  . Emesis     (Consider location/radiation/quality/duration/timing/severity/associated sxs/prior Treatment) HPI Comments: Pt with recent hx of miscarriage - saw OBGYN today and given antiemetic which she has not filled - has ongoing n/v throughout day - no diarrhea but does have mild LLQ pain.  She has no dysuria, and no vaginal bleeding - states that the bleeding has stopped and pain is improving.  No f/c/d.  Sx are persistent.    Patient is a 28 y.o. female presenting with vomiting. The history is provided by the patient, the spouse and medical records.  Emesis   Past Medical History  Diagnosis Date  . Acid reflux   . Depression   . Anxiety   . Impacted tooth 09/2013    impacted wisdom teeth  . Leg pain, right     unknown cause, is being evaluated   Past Surgical History  Procedure Laterality Date  . No past surgeries    . Tooth extraction N/A 10/06/2013    Procedure: EXTRACTION THIRD MOLARS;  Surgeon: Georgia LopesScott M Jensen, DDS;  Location: [facility];  Service: Oral Surgery;  Laterality: N/A;   Family History  Problem Relation Age of Onset  . Diabetes Mother   . Hypertension Mother   . Diabetes Father    History  Substance Use Topics  . Smoking status: Current Every Day Smoker -- 0.03 packs/day for 5 years    Types: Cigarettes  . Smokeless tobacco: Never Used     Comment: 3 cig./day  . Alcohol Use: No   OB History   Grav Para Term Preterm Abortions TAB SAB Ect Mult Living   2 1 0 1 1 0 1 0 0 1      Review of Systems  Gastrointestinal: Positive for vomiting.  All other systems reviewed and are negative.     Allergies  Review of patient's allergies indicates no known allergies.  Home Medications   Prior to Admission medications   Medication Sig Start Date End Date Taking? Authorizing  Provider  FLUoxetine (PROZAC) 20 MG capsule Take 60 mg by mouth daily.     Historical Provider, MD  HYDROcodone-acetaminophen (NORCO) 5-325 MG per tablet Take 2 tablets by mouth every 6 (six) hours as needed for severe pain. 11/04/13   Hurman HornJohn M Bednar, MD  metroNIDAZOLE (FLAGYL) 500 MG tablet Take 4 tablets (2,000 mg total) by mouth once. 11/05/13   Jacklyn ShellFrances Cresenzo-Dishmon, CNM  ondansetron (ZOFRAN ODT) 4 MG disintegrating tablet Take 1 tablet (4 mg total) by mouth every 6 (six) hours as needed for nausea. 11/05/13   Jacklyn ShellFrances Cresenzo-Dishmon, CNM  oxyCODONE-acetaminophen (PERCOCET/ROXICET) 5-325 MG per tablet Take 1-2 tablets by mouth every 4 (four) hours as needed for severe pain. 10/31/13   Raeford RazorStephen Kohut, MD  polyethylene glycol [employer](MIRALAX / Ethelene HalGLYCOLAX) packet Take 17 g by mouth daily. 11/05/13   Jacklyn ShellFrances Cresenzo-Dishmon, CNM  Prenatal Multivit-Min-Fe-FA (PRENATAL VITAMINS) 0.8 MG tablet Take 1 tablet by mouth daily. 11/05/13   Jacklyn ShellFrances Cresenzo-Dishmon, CNM  progesterone (PROMETRIUM) 200 MG capsule [facility] 10/30/13   Adline PotterJennifer A Griffin, NP   BP 117/93  Pulse 73  Temp(Src) 98 F (36.7 C) (Oral)  Resp 20  Ht 5\' 3"  (1.6 m)  Wt 259 lb (117.482 kg)  BMI 45.89 kg/m2  SpO2 100%  LMP 11/04/2013 Physical Exam  Nursing note and vitals reviewed. Constitutional: She appears well-developed and well-nourished. No distress.  HENT:  Head: Normocephalic and atraumatic.  Mouth/Throat: Oropharynx is clear and moist. No oropharyngeal exudate.  Eyes: Conjunctivae and EOM are normal. Pupils are equal, round, and reactive to light. Right eye exhibits no discharge. Left eye exhibits no discharge. No scleral icterus.  Neck: Normal range of motion. Neck supple. No JVD present. No thyromegaly present.  Cardiovascular: Normal rate, regular rhythm, normal heart sounds and intact distal pulses.  Exam reveals no gallop and no friction rub.   No murmur heard. Pulmonary/Chest: Effort normal and breath  sounds normal. No respiratory distress. She has no wheezes. She has no rales.  Abdominal: Soft. Bowel sounds are normal. She exhibits no distension and no mass. There is tenderness ( minimal LLQ ttp).  No other ttp  Genitourinary:  Chaperone present for exam - normal appearing cervix, no CMT, no adnexal ttp or fullness and no d/c or bleeding, no uterine ttp  Musculoskeletal: Normal range of motion. She exhibits no edema and no tenderness.  Lymphadenopathy:    She has no cervical adenopathy.  Neurological: She is alert. Coordination normal.  Skin: Skin is warm and dry. No rash noted. No erythema.  Psychiatric: She has a normal mood and affect. Her behavior is normal.    ED Course  Procedures (including critical care time)  Results for orders placed during the hospital encounter of 11/05/13  PREGNANCY, URINE      Result Value Ref Range   Preg Test, Ur POSITIVE (*) NEGATIVE  URINALYSIS, ROUTINE W REFLEX MICROSCOPIC      Result Value Ref Range   Color, Urine YELLOW  YELLOW   APPearance CLEAR  CLEAR   Specific Gravity, Urine <1.005 (*) 1.005 - 1.030   pH 6.0  5.0 - 8.0   Glucose, UA NEGATIVE  NEGATIVE mg/dL   Hgb urine dipstick SMALL (*) NEGATIVE   Bilirubin Urine NEGATIVE  NEGATIVE   Ketones, ur NEGATIVE  NEGATIVE mg/dL   Protein, ur NEGATIVE  NEGATIVE mg/dL   Urobilinogen, UA 0.2  0.0 - 1.0 mg/dL   Nitrite NEGATIVE  NEGATIVE   Leukocytes, UA SMALL (*) NEGATIVE  HCG, QUANTITATIVE, PREGNANCY      Result Value Ref Range   hCG, Beta Chain, Quant, S 29 (*) <5 mIU/mL  CBC WITH DIFFERENTIAL      Result Value Ref Range   WBC 7.8  4.0 - 10.5 K/uL   RBC 3.93  3.87 - 5.11 MIL/uL   Hemoglobin 12.5  12.0 - 15.0 g/dL   HCT 16.1  09.6 - 04.5 %   MCV 91.6  78.0 - 100.0 fL   MCH 31.8  26.0 - 34.0 pg   MCHC 34.7  30.0 - 36.0 g/dL   RDW 40.9  81.1 - 91.4 %   Platelets 224  150 - 400 K/uL   Neutrophils Relative % 61  43 - 77 %   Neutro Abs 4.7  1.7 - 7.7 K/uL   Lymphocytes Relative 33  12  - 46 %   Lymphs Abs 2.6  0.7 - 4.0 K/uL   Monocytes Relative 5  3 - 12 %   Monocytes Absolute 0.4  0.1 - 1.0 K/uL   Eosinophils Relative 1  0 - 5 %   Eosinophils Absolute 0.1  0.0 - 0.7 K/uL   Basophils Relative 0  0 - 1 %   Basophils Absolute 0.0  0.0 - 0.1 K/uL  URINE MICROSCOPIC-ADD ON      Result Value Ref Range   Squamous Epithelial / LPF MANY (*) RARE   WBC, UA 3-6  <3 WBC/hpf   RBC / HPF 3-6  <3 RBC/hpf   Bacteria, UA FEW (*) RARE      MDM   Final diagnoses:  Nausea and vomiting  Abdominal pain    Overall the patient is very well-appearing, vital signs are unremarkable and she is afebrile. Will recheck:, Recheck CBC, IV fluids and Zofran.  Symptoms resolved after medications and fluids, labs are reassuring with no leukocytosis and appropriately declining Quant, discussed with patient, she is stable for discharge  Filed Vitals:   11/05/13 2209 11/05/13 2214  BP: 117/93   Pulse: 73   Temp:  98 F (36.7 C)  TempSrc:  Oral  Resp: 20   Height: 5\' 3"  (1.6 m)   Weight: 259 lb (117.482 kg)   SpO2: 100%       Meds given in ED:  Medications  sodium chloride 0.9 % bolus 1,000 mL (0 mLs Intravenous Stopped 11/06/13 0207)  ondansetron (ZOFRAN) injection 4 mg (4 mg Intravenous Given 11/06/13 0057)  ketorolac (TORADOL) 30 MG/ML injection 30 mg (30 mg Intravenous Given 11/06/13 0206)    New Prescriptions   NAPROXEN (NAPROSYN) 500 MG TABLET    Take 1 tablet (500 mg total) by mouth 2 (two) times daily with a meal.   TRAMADOL (ULTRAM) 50 MG TABLET    Take 1 tablet (50 mg total) by mouth every 6 (six) hours as needed.      Vida RollerBrian D Ridley Schewe, MD 11/06/13 [phone]

## 2013-11-11 ENCOUNTER — Other Ambulatory Visit: Payer: Medicaid Other

## 2013-11-11 ENCOUNTER — Encounter: Payer: Self-pay | Admitting: *Deleted

## 2013-11-12 ENCOUNTER — Other Ambulatory Visit: Payer: Medicaid Other

## 2013-11-12 ENCOUNTER — Encounter: Payer: Self-pay | Admitting: *Deleted

## 2013-11-18 ENCOUNTER — Ambulatory Visit: Payer: Medicaid Other | Admitting: Orthopedic Surgery

## 2013-11-18 ENCOUNTER — Encounter: Payer: Self-pay | Admitting: Orthopedic Surgery

## 2013-11-21 ENCOUNTER — Encounter: Payer: Self-pay | Admitting: *Deleted

## 2013-11-21 ENCOUNTER — Other Ambulatory Visit: Payer: Medicaid Other

## 2013-11-24 ENCOUNTER — Telehealth: Payer: Self-pay | Admitting: *Deleted

## 2013-11-25 ENCOUNTER — Telehealth: Payer: Self-pay

## 2013-11-25 NOTE — Telephone Encounter (Signed)
No answer @ 2:34pm. JSY

## 2013-11-25 NOTE — Telephone Encounter (Signed)
No answer @ 4:10 pm. JSY

## 2013-11-26 ENCOUNTER — Telehealth: Payer: Self-pay | Admitting: Obstetrics and Gynecology

## 2013-11-26 ENCOUNTER — Telehealth: Payer: Self-pay | Admitting: Adult Health

## 2013-11-26 MED ORDER — NAPROXEN 500 MG PO TABS: 500.0000 mg | ORAL_TABLET | Freq: Two times a day (BID) | ORAL | Status: DC | PRN

## 2013-11-26 NOTE — Telephone Encounter (Signed)
Spoke with pt. Encounter closed. JSY °

## 2013-11-26 NOTE — Telephone Encounter (Signed)
Spoke with pt. She is having a miscarriage and is requesting something for cramps and something to slow the bleeding down. I advised I didn't think there was anything to help slow the bleeding down when you are having a miscarriage. It usually just runs its course. Pt voiced understanding. [place] [facility] [place]. Thanks!!!

## 2013-11-26 NOTE — Telephone Encounter (Signed)
I called Naproxen 500 mg #30 take 1 tab by mouth 2 times daily as needed with no refills per Drenda FreezeFran, CNM. Left message letting pt know it had been called into drug store. JSY

## 2013-11-26 NOTE — Telephone Encounter (Signed)
Left message x 1. JSY 

## 2013-11-26 NOTE — Telephone Encounter (Signed)
Had SAB 4/11 and had quit bleeding by 4/16.  This is probably a period, definitely not another SAB.  Naproxyn sent for cramps.

## 2013-11-26 NOTE — Telephone Encounter (Signed)
Unable to reach pt by phone after multiple attempts

## 2013-11-26 NOTE — Telephone Encounter (Signed)
No answer @ 11:37 am. JSY

## 2013-12-11 ENCOUNTER — Encounter (HOSPITAL_COMMUNITY): Payer: Self-pay | Admitting: Emergency Medicine

## 2013-12-11 ENCOUNTER — Emergency Department (HOSPITAL_COMMUNITY)
Admission: EM | Admit: 2013-12-11 | Discharge: 2013-12-12 | Disposition: A | Discharge status: Home / Self Care | Payer: Medicaid Other | Attending: Emergency Medicine | Admitting: Emergency Medicine

## 2013-12-11 DIAGNOSIS — M25469 Effusion, unspecified knee: Secondary | ICD-10-CM | POA: Insufficient documentation

## 2013-12-11 DIAGNOSIS — Z791 Long term (current) use of non-steroidal anti-inflammatories (NSAID): Secondary | ICD-10-CM | POA: Insufficient documentation

## 2013-12-11 DIAGNOSIS — Z792 Long term (current) use of antibiotics: Secondary | ICD-10-CM | POA: Insufficient documentation

## 2013-12-11 DIAGNOSIS — F17200 Nicotine dependence, unspecified, uncomplicated: Secondary | ICD-10-CM | POA: Insufficient documentation

## 2013-12-11 DIAGNOSIS — Z79899 Other long term (current) drug therapy: Secondary | ICD-10-CM | POA: Insufficient documentation

## 2013-12-11 DIAGNOSIS — Z8659 Personal history of other mental and behavioral disorders: Secondary | ICD-10-CM | POA: Insufficient documentation

## 2013-12-11 DIAGNOSIS — K219 Gastro-esophageal reflux disease without esophagitis: Secondary | ICD-10-CM | POA: Insufficient documentation

## 2013-12-11 DIAGNOSIS — M25569 Pain in unspecified knee: Secondary | ICD-10-CM | POA: Insufficient documentation

## 2013-12-11 NOTE — ED Notes (Signed)
Pt reports pain to right knee for several months, states is seeing dr Romeo Appleharrison for same, states pain comes and goes and got worse tonight 10/10

## 2013-12-12 MED ORDER — HYDROCODONE-ACETAMINOPHEN 5-325 MG PO TABS
1.0000 | ORAL_TABLET | Freq: Once | ORAL | Status: AC
  Administered 2013-12-12: 1 via ORAL
  Filled 2013-12-12: qty 1

## 2013-12-12 MED ORDER — IBUPROFEN 800 MG PO TABS
800.0000 mg | ORAL_TABLET | Freq: Once | ORAL | Status: AC
  Administered 2013-12-12: 800 mg via ORAL
  Filled 2013-12-12: qty 1

## 2013-12-12 MED ORDER — TRAMADOL HCL 50 MG PO TABS: 50.0000 mg | ORAL_TABLET | Freq: Four times a day (QID) | ORAL | Status: DC | PRN

## 2013-12-12 NOTE — ED Provider Notes (Signed)
CSN: [id number]     Arrival date & time 12/11/13  2315 History   First MD Initiated Contact with Patient 12/11/13 2344     Chief Complaint  Patient presents with  . Knee Pain     (Consider location/radiation/quality/duration/timing/severity/associated sxs/prior Treatment) Patient is a 28 y.o. female presenting with knee pain. The history is provided by the patient.  Knee Pain Location:  Knee Time since incident:  2 months Injury: no   Knee location:  R knee Pain details:    Quality:  Aching and throbbing   Radiates to:  Does not radiate   Severity:  Moderate   Onset quality:  Gradual   Timing:  Constant   Progression:  Worsening Chronicity:  Chronic Dislocation: no   Foreign body present:  No foreign bodies Prior injury to area:  Yes Relieved by:  Nothing Worsened by:  Activity and bearing weight Ineffective treatments:  None tried Associated symptoms: no back pain, no decreased ROM, no fever, no numbness, no stiffness, no swelling and no tingling    Patient with hx of chronic right knee pain, c/o worsening pain tonight.  States the pain is worse with weight bearing and walking up and down steps.  She describes a "clicking and popping" sensation to her knee.    She states that she has seen Dr. Romeo Apple for this previously and was instructed to begin PT, but it was not covered by her insurance.  She has a follow-up appt with him in 2 weeks.  She denies recent injury, swelling, redness, fever, chills, calf pain or numbness of the leg.    Past Medical History  Diagnosis Date  . Acid reflux   . Depression   . Anxiety   . Impacted tooth 09/2013    impacted wisdom teeth  . Leg pain, right     unknown cause, is being evaluated   Past Surgical History  Procedure Laterality Date  . No past surgeries    . Tooth extraction N/A 10/06/2013    Procedure: EXTRACTION THIRD MOLARS;  Surgeon: Georgia Lopes, DDS;  Location: [facility];  Service: Oral Surgery;  Laterality:  N/A;   Family History  Problem Relation Age of Onset  . Diabetes Mother   . Hypertension Mother   . Diabetes Father    History  Substance Use Topics  . Smoking status: Current Every Day Smoker -- 0.03 packs/day for 5 years    Types: Cigarettes  . Smokeless tobacco: Never Used     Comment: 3 cig./day  . Alcohol Use: No   OB History   Grav Para Term Preterm Abortions TAB SAB Ect Mult Living   2 1 0 1 1 0 1 0 0 1      Review of Systems  Constitutional: Negative for fever and chills.  Cardiovascular: Negative for chest pain.  Genitourinary: Negative for dysuria and difficulty urinating.  Musculoskeletal: Positive for arthralgias and joint swelling. Negative for back pain and stiffness.  Skin: Negative for color change and wound.  All other systems reviewed and are negative.     Allergies  Review of patient's allergies indicates no known allergies.  Home Medications   Prior to Admission medications   Medication Sig Start Date End Date Taking? Authorizing Provider  naproxen (NAPROSYN) 500 MG tablet Take 1 tablet (500 mg total) by mouth 2 (two) times daily as needed. 11/26/13  Yes Scarlette Calico Cresenzo-Dishmon, CNM  ondansetron (ZOFRAN ODT) 4 MG disintegrating tablet Take 1 tablet (4 mg total) by  mouth every 6 (six) hours as needed for nausea. 11/05/13  Yes Jacklyn ShellFrances Cresenzo-Dishmon, CNM  progesterone (PROMETRIUM) 200 MG capsule [facility]  Yes Adline PotterJennifer A Griffin, NP  FLUoxetine (PROZAC) 20 MG capsule Take 60 mg by mouth daily.     Historical Provider, MD  HYDROcodone-acetaminophen (NORCO) 5-325 MG per tablet Take 2 tablets by mouth every 6 (six) hours as needed for severe pain. 11/04/13   Hurman HornJohn M Bednar, MD  metroNIDAZOLE (FLAGYL) 500 MG tablet Take 4 tablets (2,000 mg total) by mouth once. 11/05/13   Jacklyn ShellFrances Cresenzo-Dishmon, CNM  oxyCODONE-acetaminophen (PERCOCET/ROXICET) 5-325 MG per tablet Take 1-2 tablets by mouth every 4 (four) hours as needed for  severe pain. 10/31/13   Raeford RazorStephen Kohut, MD  polyethylene glycol [facility](MIRALAX / Ethelene HalGLYCOLAX) packet Take 17 g by mouth daily. 11/05/13   Jacklyn ShellFrances Cresenzo-Dishmon, CNM  Prenatal Multivit-Min-Fe-FA (PRENATAL VITAMINS) 0.8 MG tablet Take 1 tablet by mouth daily. 11/05/13   Jacklyn ShellFrances Cresenzo-Dishmon, CNM  traMADol (ULTRAM) 50 MG tablet Take 1 tablet (50 mg total) by mouth every 6 (six) hours as needed. 11/06/13   Vida RollerBrian D Miller, MD   BP 125/70  Pulse 65  Temp(Src) 98.3 F (36.8 C) (Oral)  Resp 18  Ht 5\' 3"  (1.6 m)  Wt 254 lb (115.214 kg)  BMI 45.01 kg/m2  SpO2 100%  LMP 12/07/2013 Physical Exam  Nursing note and vitals reviewed. Constitutional: She is oriented to person, place, and time. She appears well-developed and well-nourished. No distress.  HENT:  Head: Normocephalic and atraumatic.  Cardiovascular: Normal rate, regular rhythm, normal heart sounds and intact distal pulses.   Pulmonary/Chest: Effort normal and breath sounds normal. No respiratory distress.  Musculoskeletal: Normal range of motion. She exhibits tenderness. She exhibits no edema.  ttp of the anterior right knee.  Moderate patella crepitus throughout ROM.  No erythema, effusion, or step-off deformity.  DP pulse brisk, distal sensation intact. Calf is soft and NT. Compartments also soft.  Neurological: She is alert and oriented to person, place, and time. She exhibits normal muscle tone. Coordination normal.  Skin: Skin is warm and dry. No erythema.    ED Course  Procedures (including critical care time) Labs Review Labs Reviewed - No data to display  Imaging Review No results found.   EKG Interpretation None      MDM   Final diagnoses:  Knee pain    Pt with patellar crepitus on exam. No concerning sx's for septic joint. No recent injury to indicate need for imaging at this time.  No calf pain or edema, compartments soft.  Pt has upcoming appt with ortho.  Will treat symptomatically with ultram, she is currently taking  naprosyn.    ACE wrap applied for comfort.  Pt agrees to care plan and appears stable for d/c    Windi Toro L. Quantay Zaremba, PA-C 12/12/13 0041

## 2013-12-12 NOTE — Discharge Instructions (Signed)
Knee Pain Knee pain can be a result of an injury or other medical conditions. Treatment will depend on the cause of your pain. HOME CARE  Only take medicine as told by your doctor.  Keep a healthy weight. Being overweight can make the knee hurt more.  Stretch before exercising or playing sports.  If there is constant knee pain, change the way you exercise. Ask your doctor for advice.  Make sure shoes fit well. Choose the right shoe for the sport or activity.  Protect your knees. Wear kneepads if needed.  Rest when you are tired. GET HELP RIGHT AWAY IF:   Your knee pain does not stop.  Your knee pain does not get better.  Your knee joint feels hot to the touch.  You have a fever. MAKE SURE YOU:   Understand these instructions.  Will watch this condition.  Will get help right away if you are not doing well or get worse. Document Released: 10/06/2008 Document Revised: 10/02/2011 Document Reviewed: 10/06/2008 [employer] Patient Information 2014 [employer].  

## 2013-12-12 NOTE — ED Provider Notes (Signed)
Medical screening examination/treatment/procedure(s) were performed by non-physician practitioner and as supervising physician I was immediately available for consultation/collaboration.   Dione Booze, MD 12/12/13 [phone]

## 2013-12-25 ENCOUNTER — Other Ambulatory Visit: Payer: Self-pay | Admitting: *Deleted

## 2013-12-25 ENCOUNTER — Ambulatory Visit (INDEPENDENT_AMBULATORY_CARE_PROVIDER_SITE_OTHER): Payer: Medicaid Other | Admitting: Orthopedic Surgery

## 2013-12-25 DIAGNOSIS — M23329 Other meniscus derangements, posterior horn of medial meniscus, unspecified knee: Secondary | ICD-10-CM

## 2013-12-25 MED ORDER — HYDROCODONE-ACETAMINOPHEN 5-325 MG PO TABS: 2.0000 | ORAL_TABLET | Freq: Four times a day (QID) | ORAL | Status: DC | PRN

## 2013-12-25 MED ORDER — IBUPROFEN 800 MG PO TABS: 800.0000 mg | ORAL_TABLET | Freq: Three times a day (TID) | ORAL | Status: DC | PRN

## 2013-12-25 NOTE — Patient Instructions (Addendum)
Mri right knee   Meds ordered this encounter  Medications  . HYDROcodone-acetaminophen (NORCO) 5-325 MG per tablet    Sig: Take 2 tablets by mouth every 6 (six) hours as needed for severe pain.    Dispense:  30 tablet    Refill:  0  . ibuprofen (ADVIL,MOTRIN) 800 MG tablet    Sig: Take 1 tablet (800 mg total) by mouth every 8 (eight) hours as needed.    Dispense:  90 tablet    Refill:  5

## 2013-12-25 NOTE — Progress Notes (Signed)
Patient ID: Pamela Ford, female   DOB: 1986/07/14, 28 y.o.   MRN: [id number] Chief Complaint  Patient presents with  . Knee Pain    Since January 2015 pain knee treated nonoperatively    HISTORY: 28 year old female please see original note but she basically injured her knee we treated her nonoperatively we gave her anti-inflammatories as well as hydrocodone we've treated her for over 6 months we've seen her again after said treatment she had physical therapy and this is her followup visit she's not any better she still has catching locking and mechanical symptoms of the right knee with medial joint line pain  She says her pain medication doesn't work  Review of systems is negative  Past Medical History  Diagnosis Date  . Acid reflux   . Depression   . Anxiety   . Impacted tooth 09/2013    impacted wisdom teeth  . Leg pain, right     unknown cause, is being evaluated    At this point we recommend that she undergo MRI evaluation of the knee to evaluate for medial meniscal tear and make plans for surgical intervention    Encounter Diagnosis  Name Primary?  . 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 HYDROcodone-acetaminophen (NORCO) 5-325 MG per tablet    Sig: Take 2 tablets by mouth every 6 (six) hours as needed for severe pain.    Dispense:  30 tablet    Refill:  0  . ibuprofen (ADVIL,MOTRIN) 800 MG tablet    Sig: Take 1 tablet (800 mg total) by mouth every 8 (eight) hours as needed.    Dispense:  90 tablet    Refill:  5

## 2014-01-06 ENCOUNTER — Ambulatory Visit (HOSPITAL_COMMUNITY)
Admission: RE | Admit: 2014-01-06 | Discharge: 2014-01-06 | Disposition: A | Discharge status: Home / Self Care | Payer: Medicaid Other | Source: Ambulatory Visit | Attending: Orthopedic Surgery | Admitting: Orthopedic Surgery

## 2014-01-06 DIAGNOSIS — M25569 Pain in unspecified knee: Secondary | ICD-10-CM | POA: Insufficient documentation

## 2014-01-06 DIAGNOSIS — R937 Abnormal findings on diagnostic imaging of other parts of musculoskeletal system: Secondary | ICD-10-CM | POA: Insufficient documentation

## 2014-01-06 DIAGNOSIS — M23329 Other meniscus derangements, posterior horn of medial meniscus, unspecified knee: Secondary | ICD-10-CM

## 2014-01-14 ENCOUNTER — Telehealth: Payer: Self-pay | Admitting: Orthopedic Surgery

## 2014-01-14 NOTE — Telephone Encounter (Signed)
Patient called, requests refill of pain medication: Hydrocodone 5-325.  She is scheduled for a follow up appointment for MRI results 01/20/14.  Her phone # is [phone].

## 2014-01-14 NOTE — Telephone Encounter (Signed)
Called patient and advised that Dr Harrison is out of the office until 01/19/14 and refills unavailable until his return 

## 2014-01-18 NOTE — Telephone Encounter (Signed)
REFILL 

## 2014-01-19 ENCOUNTER — Other Ambulatory Visit: Payer: Self-pay | Admitting: *Deleted

## 2014-01-19 DIAGNOSIS — M23321 Other meniscus derangements, posterior horn of medial meniscus, right knee: Secondary | ICD-10-CM

## 2014-01-19 MED ORDER — HYDROCODONE-ACETAMINOPHEN 5-325 MG PO TABS: 2.0000 | ORAL_TABLET | Freq: Four times a day (QID) | ORAL | Status: DC | PRN

## 2014-01-19 NOTE — Telephone Encounter (Signed)
Patient is aware prescription is available for pick up

## 2014-01-20 ENCOUNTER — Ambulatory Visit (INDEPENDENT_AMBULATORY_CARE_PROVIDER_SITE_OTHER): Payer: Medicaid Other | Admitting: Orthopedic Surgery

## 2014-01-20 VITALS — BP 122/85 | Ht 63.0 in | Wt 254.0 lb

## 2014-01-20 DIAGNOSIS — M25561 Pain in right knee: Secondary | ICD-10-CM

## 2014-01-20 DIAGNOSIS — M25569 Pain in unspecified knee: Secondary | ICD-10-CM

## 2014-01-20 NOTE — Progress Notes (Signed)
Patient ID: Pamela Ford, female   DOB: 04/20/1986, 28 y.o.   MRN: [id number]  Chief Complaint  Patient presents with  . Results    MRI results right knee    Recheck right knee status post MRI for possible posterior horn derangement. The patient complained of atraumatic onset of anterior knee pain. She was treated nonoperatively with anti-inflammatories and hydrocodone she didn't improve over a 6 month period she also had physical therapy limited to 3 visits and she says the pain medicine didn't work  Review of systems otherwise negative  My interpretation of the MRI since the MRI was normal  The report was read as mild degeneration of the medial compartment  Her pain is primarily anterior and she has anterior knee pain syndrome surgical cause for internal derangement  Recommend physical therapy at home  She wanted some pain medication we gave her 5 mg Norco she wanted a much larger amount and I offered her options of pain management and she accepted and we will make referral. No followups necessary. She is counseled on the need to perform the therapy as there is no surgical treatment for her problem

## 2014-01-20 NOTE — Patient Instructions (Addendum)
Knee exercises for 6 weeks   Referral to pain management

## 2014-01-21 ENCOUNTER — Telehealth: Payer: Self-pay | Admitting: *Deleted

## 2014-01-21 NOTE — Telephone Encounter (Signed)
[name] PCP advised of need for Pain Management referral ([facility] to [facility]) Patient aware

## 2014-02-13 ENCOUNTER — Telehealth: Payer: Self-pay | Admitting: *Deleted

## 2014-02-13 NOTE — Telephone Encounter (Signed)
PATIENT IS REQUESTING REFILL ON HYDROCODONE 5/325

## 2014-02-16 ENCOUNTER — Telehealth: Payer: Self-pay | Admitting: *Deleted

## 2014-02-16 NOTE — Telephone Encounter (Signed)
Patient requesting refill on Hydrocodone, does not have appointment with pain management until September

## 2014-02-16 NOTE — Telephone Encounter (Signed)
No more meds

## 2014-02-17 NOTE — Telephone Encounter (Signed)
Attempted to contact patient, no current contact number

## 2014-02-18 ENCOUNTER — Telehealth: Payer: Self-pay | Admitting: *Deleted

## 2014-02-18 NOTE — Telephone Encounter (Signed)
Patient called for refill, was advised Dr Romeo AppleHarrison will no longer refill her pain meds

## 2014-03-02 ENCOUNTER — Ambulatory Visit: Payer: Medicaid Other | Admitting: Adult Health

## 2014-03-03 ENCOUNTER — Ambulatory Visit: Payer: Medicaid Other | Admitting: Adult Health

## 2014-03-05 ENCOUNTER — Encounter: Payer: Self-pay | Admitting: *Deleted

## 2014-03-05 ENCOUNTER — Ambulatory Visit: Payer: Medicaid Other | Admitting: Adult Health

## 2014-03-09 ENCOUNTER — Encounter (HOSPITAL_COMMUNITY): Payer: Self-pay | Admitting: Emergency Medicine

## 2014-03-09 ENCOUNTER — Emergency Department (HOSPITAL_COMMUNITY)
Admission: EM | Admit: 2014-03-09 | Discharge: 2014-03-09 | Disposition: A | Discharge status: Home / Self Care | Payer: Medicare Other | Attending: Emergency Medicine | Admitting: Emergency Medicine

## 2014-03-09 DIAGNOSIS — F329 Major depressive disorder, single episode, unspecified: Secondary | ICD-10-CM | POA: Insufficient documentation

## 2014-03-09 DIAGNOSIS — N946 Dysmenorrhea, unspecified: Secondary | ICD-10-CM | POA: Diagnosis not present

## 2014-03-09 DIAGNOSIS — N938 Other specified abnormal uterine and vaginal bleeding: Secondary | ICD-10-CM | POA: Diagnosis not present

## 2014-03-09 DIAGNOSIS — F3289 Other specified depressive episodes: Secondary | ICD-10-CM | POA: Diagnosis not present

## 2014-03-09 DIAGNOSIS — Z3202 Encounter for pregnancy test, result negative: Secondary | ICD-10-CM | POA: Diagnosis not present

## 2014-03-09 DIAGNOSIS — N949 Unspecified condition associated with female genital organs and menstrual cycle: Secondary | ICD-10-CM | POA: Diagnosis not present

## 2014-03-09 DIAGNOSIS — F17200 Nicotine dependence, unspecified, uncomplicated: Secondary | ICD-10-CM | POA: Insufficient documentation

## 2014-03-09 DIAGNOSIS — R112 Nausea with vomiting, unspecified: Secondary | ICD-10-CM | POA: Diagnosis not present

## 2014-03-09 DIAGNOSIS — F411 Generalized anxiety disorder: Secondary | ICD-10-CM | POA: Insufficient documentation

## 2014-03-09 DIAGNOSIS — N925 Other specified irregular menstruation: Secondary | ICD-10-CM | POA: Diagnosis not present

## 2014-03-09 DIAGNOSIS — Z79899 Other long term (current) drug therapy: Secondary | ICD-10-CM | POA: Diagnosis not present

## 2014-03-09 DIAGNOSIS — K219 Gastro-esophageal reflux disease without esophagitis: Secondary | ICD-10-CM | POA: Insufficient documentation

## 2014-03-09 DIAGNOSIS — Z791 Long term (current) use of non-steroidal anti-inflammatories (NSAID): Secondary | ICD-10-CM | POA: Insufficient documentation

## 2014-03-09 LAB — CBC WITH DIFFERENTIAL/PLATELET
Basophils Absolute: 0 10*3/uL (ref 0.0–0.1)
Basophils Relative: 0 % (ref 0–1)
EOS PCT: 1 % (ref 0–5)
Eosinophils Absolute: 0.1 10*3/uL (ref 0.0–0.7)
HEMATOCRIT: 40.9 % (ref 36.0–46.0)
Hemoglobin: 14.3 g/dL (ref 12.0–15.0)
LYMPHS ABS: 0.9 10*3/uL (ref 0.7–4.0)
LYMPHS PCT: 10 % — AB (ref 12–46)
MCH: 32.1 pg (ref 26.0–34.0)
MCHC: 35 g/dL (ref 30.0–36.0)
MCV: 91.9 fL (ref 78.0–100.0)
Monocytes Absolute: 0.4 10*3/uL (ref 0.1–1.0)
Monocytes Relative: 5 % (ref 3–12)
Neutro Abs: 7.7 10*3/uL (ref 1.7–7.7)
Neutrophils Relative %: 84 % — ABNORMAL HIGH (ref 43–77)
Platelets: 220 10*3/uL (ref 150–400)
RBC: 4.45 MIL/uL (ref 3.87–5.11)
RDW: 13.8 % (ref 11.5–15.5)
WBC: 9.1 10*3/uL (ref 4.0–10.5)

## 2014-03-09 LAB — COMPREHENSIVE METABOLIC PANEL
ALT: 14 U/L (ref 0–35)
AST: 17 U/L (ref 0–37)
Albumin: 3.6 g/dL (ref 3.5–5.2)
Alkaline Phosphatase: 90 U/L (ref 39–117)
Anion gap: 12 (ref 5–15)
BUN: 6 mg/dL (ref 6–23)
CALCIUM: 9 mg/dL (ref 8.4–10.5)
CO2: 24 meq/L (ref 19–32)
CREATININE: 0.92 mg/dL (ref 0.50–1.10)
Chloride: 108 mEq/L (ref 96–112)
GFR calc non Af Amer: 85 mL/min — ABNORMAL LOW (ref >90)
GLUCOSE: 120 mg/dL — AB (ref 70–99)
Potassium: 4.5 mEq/L (ref 3.7–5.3)
Sodium: 144 mEq/L (ref 137–147)
Total Bilirubin: 0.3 mg/dL (ref 0.3–1.2)
Total Protein: 7.4 g/dL (ref 6.0–8.3)

## 2014-03-09 LAB — POC URINE PREG, ED: PREG TEST UR: NEGATIVE

## 2014-03-09 LAB — URINE MICROSCOPIC-ADD ON

## 2014-03-09 LAB — URINALYSIS, ROUTINE W REFLEX MICROSCOPIC
Bilirubin Urine: NEGATIVE
Glucose, UA: NEGATIVE mg/dL
Ketones, ur: NEGATIVE mg/dL
LEUKOCYTES UA: NEGATIVE
Nitrite: NEGATIVE
PROTEIN: NEGATIVE mg/dL
Specific Gravity, Urine: 1.02 (ref 1.005–1.030)
Urobilinogen, UA: 0.2 mg/dL (ref 0.0–1.0)
pH: 5.5 (ref 5.0–8.0)

## 2014-03-09 MED ORDER — KETOROLAC TROMETHAMINE 30 MG/ML IJ SOLN
30.0000 mg | Freq: Once | INTRAMUSCULAR | Status: AC
  Administered 2014-03-09: 30 mg via INTRAVENOUS
  Filled 2014-03-09: qty 1

## 2014-03-09 MED ORDER — ONDANSETRON HCL 4 MG PO TABS: 4.0000 mg | ORAL_TABLET | Freq: Four times a day (QID) | ORAL | Status: DC | PRN

## 2014-03-09 MED ORDER — IBUPROFEN 800 MG PO TABS: 800.0000 mg | ORAL_TABLET | Freq: Three times a day (TID) | ORAL | Status: DC | PRN

## 2014-03-09 MED ORDER — SODIUM CHLORIDE 0.9 % IV BOLUS (SEPSIS)
1000.0000 mL | Freq: Once | INTRAVENOUS | Status: AC
  Administered 2014-03-09: 1000 mL via INTRAVENOUS

## 2014-03-09 MED ORDER — ONDANSETRON HCL 4 MG/2ML IJ SOLN
4.0000 mg | Freq: Once | INTRAMUSCULAR | Status: AC
  Administered 2014-03-09: 4 mg via INTRAVENOUS
  Filled 2014-03-09: qty 2

## 2014-03-09 NOTE — ED Notes (Signed)
Having nausea and vomiting.  Vomited about 4-5 times.  Took ibuprofen with no relief.

## 2014-03-09 NOTE — ED Provider Notes (Signed)
TIME SEEN: 9:15 PM  CHIEF COMPLAINT: Nausea, vomiting  HPI: Patient is a 28 year old G2 P1 A1 who presents to the emergency department with nausea and vomiting that started this morning. She reports she has had approximately 10 episodes of nonbloody, nonbilious vomiting. No diarrhea. No fevers, chills, dysuria or hematuria, vaginal discharge. She did start her menstrual cycle on Saturday 2 days ago. She states that this is happened to her in the past when she started her menstrual cycle. She is having diffuse abdominal cramping that did not improve with ibuprofen earlier today. No sick contacts or recent travel. No recent hospitalization or antibiotic use.  ROS: See HPI Constitutional: no fever  Eyes: no drainage  ENT: no runny nose   Cardiovascular:  no chest pain  Resp: no SOB  GI:  vomiting GU: no dysuria Integumentary: no rash  Allergy: no hives  Musculoskeletal: no leg swelling  Neurological: no slurred speech ROS otherwise negative  PAST MEDICAL HISTORY/PAST SURGICAL HISTORY:  Past Medical History  Diagnosis Date  . Acid reflux   . Depression   . Anxiety   . Impacted tooth 09/2013    impacted wisdom teeth  . Leg pain, right     unknown cause, is being evaluated    MEDICATIONS:  Prior to Admission medications   Medication Sig Start Date End Date Taking? Authorizing Provider  FLUoxetine (PROZAC) 20 MG capsule Take 60 mg by mouth daily.     Historical Provider, MD  HYDROcodone-acetaminophen (NORCO) 5-325 MG per tablet Take 2 tablets by mouth every 6 (six) hours as needed for severe pain. 01/19/14   Vickki HearingStanley E Harrison, MD  ibuprofen (ADVIL,MOTRIN) 800 MG tablet Take 1 tablet (800 mg total) by mouth every 8 (eight) hours as needed. 12/25/13   Vickki HearingStanley E Harrison, MD  metroNIDAZOLE (FLAGYL) 500 MG tablet Take 4 tablets (2,000 mg total) by mouth once. 11/05/13   Jacklyn ShellFrances Cresenzo-Dishmon, CNM  naproxen (NAPROSYN) 500 MG tablet Take 1 tablet (500 mg total) by mouth 2 (two) times daily  as needed. 11/26/13   Jacklyn ShellFrances Cresenzo-Dishmon, CNM  ondansetron (ZOFRAN ODT) 4 MG disintegrating tablet Take 1 tablet (4 mg total) by mouth every 6 (six) hours as needed for nausea. 11/05/13   Jacklyn ShellFrances Cresenzo-Dishmon, CNM  oxyCODONE-acetaminophen (PERCOCET/ROXICET) 5-325 MG per tablet Take 1-2 tablets by mouth every 4 (four) hours as needed for severe pain. 10/31/13   Raeford RazorStephen Kohut, MD  polyethylene glycol [facility](MIRALAX / Ethelene HalGLYCOLAX) packet Take 17 g by mouth daily. 11/05/13   Jacklyn ShellFrances Cresenzo-Dishmon, CNM  Prenatal Multivit-Min-Fe-FA (PRENATAL VITAMINS) 0.8 MG tablet Take 1 tablet by mouth daily. 11/05/13   Jacklyn ShellFrances Cresenzo-Dishmon, CNM  progesterone (PROMETRIUM) 200 MG capsule [facility] 10/30/13   Adline PotterJennifer A Griffin, NP  traMADol (ULTRAM) 50 MG tablet Take 1 tablet (50 mg total) by mouth every 6 (six) hours as needed. 11/06/13   Vida RollerBrian D Miller, MD  traMADol (ULTRAM) 50 MG tablet Take 1 tablet (50 mg total) by mouth every 6 (six) hours as needed. 12/12/13   Tammy L. Triplett, PA-C    ALLERGIES:  No Known Allergies  SOCIAL HISTORY:  History  Substance Use Topics  . Smoking status: Current Every Day Smoker -- 0.03 packs/day for 5 years    Types: Cigarettes  . Smokeless tobacco: Never Used     Comment: 3 cig./day  . Alcohol Use: No    FAMILY HISTORY: Family History  Problem Relation Age of Onset  . Diabetes Mother   .  Hypertension Mother   . Diabetes Father     EXAM: BP 149/70  Pulse 104  Temp(Src) 98.8 F (37.1 C) (Oral)  Resp 24  Ht 5\' 3"  (1.6 m)  Wt 257 lb (116.574 kg)  BMI 45.54 kg/m2  SpO2 100%  LMP 09/05/2013 CONSTITUTIONAL: Alert and oriented and responds appropriately to questions. Well-appearing; well-nourished, well-appearing, nontoxic HEAD: Normocephalic EYES: Conjunctivae clear, PERRL ENT: normal nose; no rhinorrhea; slightly dry mucous membranes; pharynx without lesions noted NECK: Supple, no meningismus, no LAD  CARD: RRR; S1 and S2 appreciated;  no murmurs, no clicks, no rubs, no gallops RESP: Normal chest excursion without splinting or tachypnea; breath sounds clear and equal bilaterally; no wheezes, no rhonchi, no rales,  ABD/GI: Normal bowel sounds; non-distended; soft, non-tender, no rebound, no guarding, no peritoneal signs, obese BACK:  The back appears normal and is non-tender to palpation, there is no CVA tenderness EXT: Normal ROM in all joints; non-tender to palpation; no edema; normal capillary refill; no cyanosis    SKIN: Normal color for age and race; warm NEURO: Moves all extremities equally PSYCH: The patient's mood and manner are appropriate. Grooming and personal hygiene are appropriate.  MEDICAL DECISION MAKING: Patient here with vomiting that started this morning. Her abdominal exam is benign. She does have slightly dry mucous membranes but is hemodynamically stable. We'll give IV fluids, Zofran. We'll check abdominal labs, urine and urine pregnancy test. I do not feel she needs abdominal imaging at this time given her very benign abdominal exam.  ED PROGRESS:  [name] labs are unremarkable. Urine shows large hemoglobin but this is likely from her menstrual cycle. Her pregnancy test negative. She's been able to tolerate by mouth. No further vomiting in the ED. I feel she is safe to be discharged home. We'll discharge with prescription for antiemetics. Discussed return precautions and supportive care instructions. She verbalized understanding and is comfortable with plan.     Layla Maw Kelten Enochs, DO 03/09/14 [id number]

## 2014-03-09 NOTE — Discharge Instructions (Signed)
Menstruation Menstruation is the monthly passing of blood, tissue, fluid and mucus, also know as a period. Your body is shedding the lining of the uterus. The flow, or amount of blood, usually lasts from 3-7 days each month. Hormones control the menstrual cycle. Hormones are a chemical substance produced by endocrine glands in the body to regulate different bodily functions. The first menstrual period may start any time between age 26 years to 16 years. However, it usually starts around age 12 years. Some girls have regular monthly menstrual cycles right from the beginning. However, it is not unusual to have only a couple of drops of blood or spotting when you first start menstruating. It is also not unusual to have two periods a month or miss a month or two when first starting your periods. SYMPTOMS   Mild to moderate abdominal cramps.  Aching or pain in the lower back area. Symptoms may occur 5-10 days before your menstrual period starts. These symptoms are referred to as premenstrual syndrome (PMS). These symptoms can include:  Headache.  Breast tenderness and swelling.  Bloating.  Tiredness (fatigue).  Mood changes.  Craving for certain foods. These are normal signs and symptoms and can vary in severity. To help relieve these problems, ask your caregiver if you can take over-the-counter medications for pain or discomfort. If the symptoms are not controllable, see your caregiver for help.  HORMONES INVOLVED IN MENSTRUATION Menstruation comes about because of hormones produced by the pituitary gland in the brain and the ovaries that affect the uterine lining. First, the pituitary gland in the brain produces the hormone follicle stimulating hormone (FSH). FSH stimulates the ovaries to produce estrogen, which thickens the uterine lining and begins to develop an egg in the ovary. About 14 days later, the pituitary gland produces another hormone called luteinizing hormone (LH). LH causes the egg  to come out of a sac in the ovary (ovulation). The empty sac on the ovary called the corpus luteum is stimulated by another hormone from the pituitary gland called luteotropin. The corpus luteum begins to produce the estrogen and progesterone hormone. The progesterone hormone prepares the lining of the uterus to have the fertilized egg (egg combined with sperm) attach to the lining of the uterus and begin to develop into a fetus. If the egg is not fertilized, the corpus luteum stops producing estrogen and progesterone, it disappears, the lining of the uterus sloughs off and a menstrual period begins. Then the menstrual cycle starts all over again and will continue monthly unless pregnancy occurs or menopause begins. The secretion of hormones is complex. Various parts of the body become involved in many chemical activities. Female sex hormones have other functions in a woman's body as well. Estrogen increases a woman's sex drive (libido). It naturally helps body get rid of fluids (diuretic). It also aids in the process of building new bone. Therefore, maintaining hormonal health is essential to all levels of a woman's well being. These hormones are usually present in normal amounts and cause you to menstruate. It is the relationship between the (small) levels of the hormones that is critical. When the balance is upset, menstrual irregularities can occur. HOW DOES THE MENSTRUAL CYCLE HAPPEN?  Menstrual cycles vary in length from 21-35 days with an average of 29 days. The cycle begins on the first day of bleeding. At this time, the pituitary gland in the brain releases FSH that travels through the bloodstream to the ovaries. The FSH stimulates the follicles in the   ovaries. This prepares the body for ovulation that occurs around the 14th day of the cycle. The ovaries produce estrogen, and this makes sure conditions are right in the uterus for implantation of the fertilized egg.  When the levels of estrogen reach a  high enough level, it signals the gland in the brain (pituitary gland) to release a surge of LH. This causes the release of the ripest egg from its follicle (ovulation). Usually only one follicle releases one egg, but sometimes more than one follicle releases an egg especially when stimulating the ovaries for in vitro fertilization. The egg can then be collected by either fallopian tube to await fertilization. The burst follicle within the ovary that is left behind is now called the corpus luteum or "yellow body." The corpus luteum continues to give off (secrete) reduced amounts of estrogen. This closes and hardens the cervix. It dries up the mucus to the naturally infertile condition.  The corpus luteum also begins to give off greater amounts of progesterone. This causes the lining of the uterus (endometrium) to thicken even more in preparation for the fertilized egg. The egg is starting to journey down from the fallopian tube to the uterus. It also signals the ovaries to stop releasing eggs. It assists in returning the cervical mucus to its infertile state.  If the egg implants successfully into the womb lining and pregnancy occurs, progesterone levels will continue to raise. It is often this hormone that gives some pregnant women a feeling of well being, like a "natural high." Progesterone levels drop again after childbirth.  If fertilization does not occur, the corpus luteum dies, stopping the production of hormones. This sudden drop in progesterone causes the uterine lining to break down, accompanied by blood (menstruation).  This starts the cycle back at day 1. The whole process starts all over again. Woman go through this cycle every month from puberty to menopause. Women have breaks only for pregnancy and breastfeeding (lactation), unless the woman has health problems that affect the female hormone system or chooses to use oral contraceptives to have unnatural menstrual periods. HOME CARE  INSTRUCTIONS   Keep track of your periods by using a calendar.  If you use tampons, get the least absorbent to avoid toxic shock syndrome.  Do not leave tampons in the vagina over night or longer than 6 hours.  Wear a sanitary pad over night.  Exercise 3-5 times a week or more.  Avoid foods and drinks that you know will make your symptoms worse before or during your period. SEEK MEDICAL CARE IF:   You develop a fever with your period.  Your periods are lasting more than 7 days.  Your period is so heavy that you have to change pads or tampons every 30 minutes.  You develop clots with your period and never had clots before.  You cannot get relief from over-the-counter medication for your symptoms.  Your period has not started, and it has been longer than 35 days. Document Released: 06/30/2002 Document Revised: 07/15/2013 Document Reviewed: 02/06/2013 Exit[employer] Patient Information 2015 [employer]. This information is not intended to replace advice given to you by your health care provider. Make sure you discuss any questions you have with your health care provider. Nausea and Vomiting Nausea is a sick feeling that often comes before throwing up (vomiting). Vomiting is a reflex where stomach contents come out of your mouth. Vomiting can cause severe loss of body fluids (dehydration). Children and elderly adults can become dehydrated quickly,   especially if they also have diarrhea. Nausea and vomiting are symptoms of a condition or disease. It is important to find the cause of your symptoms. CAUSES   Direct irritation of the stomach lining. This irritation can result from increased acid production (gastroesophageal reflux disease), infection, food poisoning, taking certain medicines (such as nonsteroidal anti-inflammatory drugs), alcohol use, or tobacco use.  Signals from the brain.These signals could be caused by a headache, heat exposure, an inner ear disturbance, increased  pressure in the brain from injury, infection, a tumor, or a concussion, pain, emotional stimulus, or metabolic problems.  An obstruction in the gastrointestinal tract (bowel obstruction).  Illnesses such as diabetes, hepatitis, gallbladder problems, appendicitis, kidney problems, cancer, sepsis, atypical symptoms of a heart attack, or eating disorders.  Medical treatments such as chemotherapy and radiation.  Receiving medicine that makes you sleep (general anesthetic) during surgery. DIAGNOSIS Your caregiver may ask for tests to be done if the problems do not improve after a few days. Tests may also be done if symptoms are severe or if the reason for the nausea and vomiting is not clear. Tests may include:  Urine tests.  Blood tests.  Stool tests.  Cultures (to look for evidence of infection).  X-rays or other imaging studies. Test results can help your caregiver make decisions about treatment or the need for additional tests. TREATMENT You need to stay well hydrated. Drink frequently but in small amounts.You may wish to drink water, sports drinks, clear broth, or eat frozen ice pops or gelatin dessert to help stay hydrated.When you eat, eating slowly may help prevent nausea.There are also some antinausea medicines that may help prevent nausea. HOME CARE INSTRUCTIONS   Take all medicine as directed by your caregiver.  If you do not have an appetite, do not force yourself to eat. However, you must continue to drink fluids.  If you have an appetite, eat a normal diet unless your caregiver tells you differently.  Eat a variety of complex carbohydrates (rice, wheat, potatoes, bread), lean meats, yogurt, fruits, and vegetables.  Avoid high-fat foods because they are more difficult to digest.  Drink enough water and fluids to keep your urine clear or pale yellow.  If you are dehydrated, ask your caregiver for specific rehydration instructions. Signs of dehydration may  include:  Severe thirst.  Dry lips and mouth.  Dizziness.  Dark urine.  Decreasing urine frequency and amount.  Confusion.  Rapid breathing or pulse. SEEK IMMEDIATE MEDICAL CARE IF:   You have blood or brown flecks (like coffee grounds) in your vomit.  You have black or bloody stools.  You have a severe headache or stiff neck.  You are confused.  You have severe abdominal pain.  You have chest pain or trouble breathing.  You do not urinate at least once every 8 hours.  You develop cold or clammy skin.  You continue to vomit for longer than 24 to 48 hours.  You have a fever. MAKE SURE YOU:   Understand these instructions.  Will watch your condition.  Will get help right away if you are not doing well or get worse. Document Released: 07/10/2005 Document Revised: 10/02/2011 Document Reviewed: 12/07/2010 Exit[employer] Patient Information 2015 [employer]. This information is not intended to replace advice given to you by your health care provider. Make sure you discuss any questions you have with your health care provider.  

## 2014-03-16 ENCOUNTER — Emergency Department (HOSPITAL_COMMUNITY): Payer: Medicare Other

## 2014-03-16 ENCOUNTER — Emergency Department (HOSPITAL_COMMUNITY)
Admission: EM | Admit: 2014-03-16 | Discharge: 2014-03-17 | Disposition: A | Discharge status: Home / Self Care | Payer: Medicare Other | Attending: Emergency Medicine | Admitting: Emergency Medicine

## 2014-03-16 ENCOUNTER — Encounter (HOSPITAL_COMMUNITY): Payer: Self-pay | Admitting: Emergency Medicine

## 2014-03-16 DIAGNOSIS — K219 Gastro-esophageal reflux disease without esophagitis: Secondary | ICD-10-CM | POA: Diagnosis not present

## 2014-03-16 DIAGNOSIS — Z8659 Personal history of other mental and behavioral disorders: Secondary | ICD-10-CM | POA: Diagnosis not present

## 2014-03-16 DIAGNOSIS — R519 Headache, unspecified: Secondary | ICD-10-CM

## 2014-03-16 DIAGNOSIS — R0602 Shortness of breath: Secondary | ICD-10-CM | POA: Insufficient documentation

## 2014-03-16 DIAGNOSIS — Z3202 Encounter for pregnancy test, result negative: Secondary | ICD-10-CM | POA: Diagnosis not present

## 2014-03-16 DIAGNOSIS — Z79899 Other long term (current) drug therapy: Secondary | ICD-10-CM | POA: Insufficient documentation

## 2014-03-16 DIAGNOSIS — R51 Headache: Secondary | ICD-10-CM | POA: Insufficient documentation

## 2014-03-16 DIAGNOSIS — R42 Dizziness and giddiness: Secondary | ICD-10-CM | POA: Diagnosis not present

## 2014-03-16 DIAGNOSIS — R110 Nausea: Secondary | ICD-10-CM | POA: Diagnosis not present

## 2014-03-16 DIAGNOSIS — F17200 Nicotine dependence, unspecified, uncomplicated: Secondary | ICD-10-CM | POA: Insufficient documentation

## 2014-03-16 LAB — CBC WITH DIFFERENTIAL/PLATELET
BASOS ABS: 0 10*3/uL (ref 0.0–0.1)
BASOS PCT: 1 % (ref 0–1)
EOS ABS: 0.5 10*3/uL (ref 0.0–0.7)
Eosinophils Relative: 8 % — ABNORMAL HIGH (ref 0–5)
HCT: 36.4 % (ref 36.0–46.0)
Hemoglobin: 12.7 g/dL (ref 12.0–15.0)
Lymphocytes Relative: 48 % — ABNORMAL HIGH (ref 12–46)
Lymphs Abs: 2.7 10*3/uL (ref 0.7–4.0)
MCH: 32 pg (ref 26.0–34.0)
MCHC: 34.9 g/dL (ref 30.0–36.0)
MCV: 91.7 fL (ref 78.0–100.0)
Monocytes Absolute: 0.3 10*3/uL (ref 0.1–1.0)
Monocytes Relative: 5 % (ref 3–12)
NEUTROS PCT: 38 % — AB (ref 43–77)
Neutro Abs: 2.1 10*3/uL (ref 1.7–7.7)
Platelets: 237 10*3/uL (ref 150–400)
RBC: 3.97 MIL/uL (ref 3.87–5.11)
RDW: 13.7 % (ref 11.5–15.5)
WBC: 5.6 10*3/uL (ref 4.0–10.5)

## 2014-03-16 LAB — URINALYSIS, ROUTINE W REFLEX MICROSCOPIC
Bilirubin Urine: NEGATIVE
GLUCOSE, UA: NEGATIVE mg/dL
HGB URINE DIPSTICK: NEGATIVE
Ketones, ur: NEGATIVE mg/dL
Nitrite: NEGATIVE
Protein, ur: NEGATIVE mg/dL
Specific Gravity, Urine: <1.005 — ABNORMAL LOW (ref 1.005–1.030)
UROBILINOGEN UA: 0.2 mg/dL (ref 0.0–1.0)
pH: 6 (ref 5.0–8.0)

## 2014-03-16 LAB — BASIC METABOLIC PANEL
Anion gap: 11 (ref 5–15)
BUN: 4 mg/dL — ABNORMAL LOW (ref 6–23)
CO2: 27 mEq/L (ref 19–32)
CREATININE: 1.06 mg/dL (ref 0.50–1.10)
Calcium: 9.3 mg/dL (ref 8.4–10.5)
Chloride: 102 mEq/L (ref 96–112)
GFR calc Af Amer: 83 mL/min — ABNORMAL LOW (ref >90)
GFR, EST NON AFRICAN AMERICAN: 72 mL/min — AB (ref >90)
Glucose, Bld: 90 mg/dL (ref 70–99)
POTASSIUM: 3.5 meq/L — AB (ref 3.7–5.3)
Sodium: 140 mEq/L (ref 137–147)

## 2014-03-16 LAB — URINE MICROSCOPIC-ADD ON

## 2014-03-16 LAB — CBG MONITORING, ED: Glucose-Capillary: 109 mg/dL — ABNORMAL HIGH (ref 70–99)

## 2014-03-16 LAB — PREGNANCY, URINE: PREG TEST UR: NEGATIVE

## 2014-03-16 MED ORDER — ONDANSETRON 4 MG PO TBDP
4.0000 mg | ORAL_TABLET | Freq: Once | ORAL | Status: AC
  Administered 2014-03-16: 4 mg via ORAL
  Filled 2014-03-16: qty 1

## 2014-03-16 MED ORDER — FAMOTIDINE 20 MG PO TABS
20.0000 mg | ORAL_TABLET | Freq: Once | ORAL | Status: AC
Start: 2014-03-17 — End: 2014-03-16
  Administered 2014-03-16: 20 mg via ORAL
  Filled 2014-03-16: qty 1

## 2014-03-16 MED ORDER — HYDROCODONE-ACETAMINOPHEN 5-325 MG PO TABS
1.0000 | ORAL_TABLET | Freq: Once | ORAL | Status: AC
  Administered 2014-03-16: 1 via ORAL
  Filled 2014-03-16: qty 1

## 2014-03-16 NOTE — ED Notes (Signed)
MD at bedside. 

## 2014-03-16 NOTE — ED Notes (Signed)
Pt reports having a headache that started this morning. States she has started feeling nauseous and dizzy with the headache. Denies any falls or emesis. States she has been outside and hasn't eaten today.

## 2014-03-16 NOTE — ED Provider Notes (Signed)
CSN: [id number]     Arrival date & time 03/16/14  1951 History   This chart was scribed for Vanetta Mulders, MD, by Yevette Edwards, ED Scribe. This patient was seen in room APA04/APA04 and the patient's care was started at 10:26 PM.  First MD Initiated Contact with Patient 03/16/14 2205     Chief Complaint  Patient presents with  . Dizziness    Patient is a 28 y.o. female presenting with dizziness. The history is provided by the patient. No language interpreter was used.  Dizziness Quality:  Lightheadedness Severity:  Mild Onset quality:  Sudden Duration:  7 hours Timing:  Unable to specify Progression:  Unchanged Chronicity:  New Context: physical activity and standing up   Relieved by:  Nothing Worsened by:  Nothing tried Ineffective treatments:  None tried Associated symptoms: headaches, nausea and shortness of breath   Associated symptoms: no chest pain, no diarrhea and no vomiting    HPI Comments: Pamela Ford is a 28 y.o. female who presents to the Emergency Department complaining of a temporal headache which began seven hours ago while she was cooking. Ms. Overholser endorses associated with lightheadedness and nausea. She voices a h/o headaches. She characterizes the pain as "throbbing," and she rates the pain as 10/10. She has not treated the pain with any medication PTA.  She denies weakness or numbness to her extremities. She denies a h/o surgeries. She also complains of acid reflux which is not treated daily. Ms. Knoche is a current smoker.   Dr. Felecia Shelling is her PCP.  Past Medical History  Diagnosis Date  . Acid reflux   . Depression   . Anxiety   . Impacted tooth 09/2013    impacted wisdom teeth  . Leg pain, right     unknown cause, is being evaluated   Past Surgical History  Procedure Laterality Date  . No past surgeries    . Tooth extraction N/A 10/06/2013    Procedure: EXTRACTION THIRD MOLARS;  Surgeon: Georgia Lopes, DDS;  Location: [facility];   Service: Oral Surgery;  Laterality: N/A;   Family History  Problem Relation Age of Onset  . Diabetes Mother   . Hypertension Mother   . Diabetes Father    History  Substance Use Topics  . Smoking status: Current Every Day Smoker -- 0.03 packs/day for 5 years    Types: Cigarettes  . Smokeless tobacco: Never Used     Comment: 3 cig./day  . Alcohol Use: No   OB History   Grav Para Term Preterm Abortions TAB SAB Ect Mult Living       Review of Systems  Constitutional: Negative for fever and chills.  HENT: Negative for rhinorrhea and sore throat.   Eyes: Negative for photophobia and visual disturbance.  Respiratory: Positive for shortness of breath. Negative for cough.   Cardiovascular: Negative for chest pain and leg swelling.  Gastrointestinal: Positive for nausea. Negative for vomiting, abdominal pain and diarrhea.  Genitourinary: Negative for dysuria and difficulty urinating.  Musculoskeletal: Negative for back pain.  Skin: Negative for rash.  Neurological: Positive for dizziness, light-headedness and headaches. Negative for weakness and numbness.  Hematological: Does not bruise/bleed easily.     Allergies  Review of patient's allergies indicates no known allergies.  Home Medications   Prior to Admission medications   Medication Sig Start Date End Date Taking? Authorizing Provider  hydrOXYzine (VISTARIL) 25 MG capsule Take  25 mg by mouth 2 (two) times daily. 01/21/14  Yes Historical Provider, MD  ondansetron (ZOFRAN) 4 MG tablet Take 1 tablet (4 mg total) by mouth every 6 (six) hours as needed for nausea or vomiting. 03/09/14  Yes Kristen N Ward, DO  ibuprofen (ADVIL,MOTRIN) 800 MG tablet Take 1 tablet (800 mg total) by mouth every 8 (eight) hours as needed for mild pain. 03/09/14   Kristen N Ward, DO   Triage Vitals: BP 122/72  Pulse 78  Temp(Src) 98.9 F (37.2 C) (Oral)  Resp 24  Ht  (1.6 m)  Wt 260 lb (117.935 kg)  BMI 46.07 kg/m2  SpO2  100%  LMP 03/12/2014  Breastfeeding? No  Physical Exam  Nursing note and vitals reviewed. Constitutional: She is oriented to person, place, and time. She appears well-developed and well-nourished. No distress.  HENT:  Head: Normocephalic and atraumatic.  Eyes: Conjunctivae and EOM are normal.  Neck: Neck supple. No tracheal deviation present.  Cardiovascular: Normal rate, regular rhythm and normal heart sounds.   No murmur heard. Pulmonary/Chest: Effort normal and breath sounds normal. No respiratory distress.  Abdominal: Bowel sounds are normal. There is no tenderness.  Musculoskeletal: Normal range of motion. She exhibits no edema.  Neurological: She is alert and oriented to person, place, and time. No cranial nerve deficit. She exhibits normal muscle tone. Coordination normal.  Skin: Skin is warm and dry.  Psychiatric: She has a normal mood and affect. Her behavior is normal.    ED Course  Procedures (including critical care time)  DIAGNOSTIC STUDIES: Oxygen Saturation is 100% on room air, normal by my interpretation.    COORDINATION OF CARE:  10:21 PM- Discussed treatment plan with patient, and the patient agreed to the plan. The plan includes lab work and a CT scan.   Labs Review Labs Reviewed  CBG MONITORING, ED - Abnormal; Notable for the following:    Glucose-Capillary 109 (*)    All other components within normal limits  URINALYSIS, ROUTINE W REFLEX MICROSCOPIC  PREGNANCY, URINE   Results for orders placed during the hospital encounter of 03/16/14  URINALYSIS, ROUTINE W REFLEX MICROSCOPIC      Result Value Ref Range   Color, Urine YELLOW  YELLOW   APPearance HAZY (*) CLEAR   Specific Gravity, Urine <1.005 (*) 1.005 - 1.030   pH 6.0  5.0 - 8.0   Glucose, UA NEGATIVE  NEGATIVE mg/dL   Hgb urine dipstick NEGATIVE  NEGATIVE   Bilirubin Urine NEGATIVE  NEGATIVE   Ketones, ur NEGATIVE  NEGATIVE mg/dL   Protein, ur NEGATIVE  NEGATIVE mg/dL   Urobilinogen, UA 0.2   0.0 - 1.0 mg/dL   Nitrite NEGATIVE  NEGATIVE   Leukocytes, UA MODERATE (*) NEGATIVE  PREGNANCY, URINE      Result Value Ref Range   Preg Test, Ur NEGATIVE  NEGATIVE  URINE MICROSCOPIC-ADD ON      Result Value Ref Range   Squamous Epithelial / LPF FEW (*) RARE   WBC, UA 3-6  <3 WBC/hpf  BASIC METABOLIC PANEL      Result Value Ref Range   Sodium 140  137 - 147 mEq/L   Potassium 3.5 (*) 3.7 - 5.3 mEq/L   Chloride 102  96 - 112 mEq/L   CO2 27  19 - 32 mEq/L   Glucose, Bld 90  70 - 99 mg/dL   BUN 4 (*) 6 - 23 mg/dL   Creatinine, Ser 4.09  0.50 - 1.10 mg/dL  Calcium 9.3  8.4 - 10.5 mg/dL   GFR calc non Af Amer 72 (*) >90 mL/min   GFR calc Af Amer 83 (*) >90 mL/min   Anion gap 11  5 - 15  CBC WITH DIFFERENTIAL      Result Value Ref Range   WBC 5.6  4.0 - 10.5 K/uL   RBC 3.97  3.87 - 5.11 MIL/uL   Hemoglobin 12.7  12.0 - 15.0 g/dL   HCT 11.9  14.7 - 82.9 %   MCV 91.7  78.0 - 100.0 fL   MCH 32.0  26.0 - 34.0 pg   MCHC 34.9  30.0 - 36.0 g/dL   RDW 56.2  13.0 - 86.5 %   Platelets 237  150 - 400 K/uL   Neutrophils Relative % 38 (*) 43 - 77 %   Neutro Abs 2.1  1.7 - 7.7 K/uL   Lymphocytes Relative 48 (*) 12 - 46 %   Lymphs Abs 2.7  0.7 - 4.0 K/uL   Monocytes Relative 5  3 - 12 %   Monocytes Absolute 0.3  0.1 - 1.0 K/uL   Eosinophils Relative 8 (*) 0 - 5 %   Eosinophils Absolute 0.5  0.0 - 0.7 K/uL   Basophils Relative 1  0 - 1 %   Basophils Absolute 0.0  0.0 - 0.1 K/uL  CBG MONITORING, ED      Result Value Ref Range   Glucose-Capillary 109 (*) 70 - 99 mg/dL     Imaging Review No results found.   EKG Interpretation None      MDM   Final diagnoses:  None    Is a long-standing history of reflux disease. That's been aggravated here of late. Patient taking Pepcid in the past but ran out. That is been renewed. [name] headache complaint bilateral temple area throbbing in nature no history of migraines. Probably more of a tension-type headache head CT was negative.  Dizziness without vertigo. Patient feeling lightheaded. Patient with nausea but no vomiting. All symptoms improved here in the emergency department with hydrocodone and antinausea medicine and Pepcid. Patient has regular Dr. followup with. Patient we discharged home on Pepcid Phenergan and hydrocodone. Patient nontoxic no acute distress. Labs without any significant abnormalities. No leukocytosis no significant anemia. Electrolytes without evidence of acidosis or renal problems. Mild hypokalemia 3.5. Urinalysis negative for urinary tract infection.     I personally performed the services described in this documentation, which was scribed in my presence. The recorded information has been reviewed and is accurate.      Vanetta Mulders, MD 03/17/14 [phone]

## 2014-03-16 NOTE — ED Notes (Addendum)
Pt requesting saltine crackers and something to drink. Told she needed to wait until results were reviewed by the EDP.

## 2014-03-16 NOTE — ED Notes (Signed)
CBG 109 in triage

## 2014-03-16 NOTE — ED Notes (Signed)
Pt c/o HA and dizziness today, + nausea but no vomiting

## 2014-03-17 DIAGNOSIS — R42 Dizziness and giddiness: Secondary | ICD-10-CM | POA: Diagnosis not present

## 2014-03-17 MED ORDER — PROMETHAZINE HCL 25 MG PO TABS: 25.0000 mg | ORAL_TABLET | Freq: Four times a day (QID) | ORAL | Status: DC | PRN

## 2014-03-17 MED ORDER — HYDROCODONE-ACETAMINOPHEN 5-325 MG PO TABS: 1.0000 | ORAL_TABLET | Freq: Four times a day (QID) | ORAL | Status: DC | PRN

## 2014-03-17 MED ORDER — FAMOTIDINE 20 MG PO TABS: 20.0000 mg | ORAL_TABLET | Freq: Two times a day (BID) | ORAL | Status: DC

## 2014-03-17 NOTE — Discharge Instructions (Signed)
Take medications as directed. Pepcid provided for your reflux disease. Make a point to followup with her record Dr. [place] Pink CT was negative. Take pain medicine as needed take antinausea medicine as needed.

## 2014-03-19 ENCOUNTER — Ambulatory Visit: Payer: Medicaid Other | Admitting: Adult Health

## 2014-03-24 ENCOUNTER — Ambulatory Visit: Payer: Medicaid Other | Admitting: Adult Health

## 2014-03-25 ENCOUNTER — Ambulatory Visit: Payer: Medicaid Other | Admitting: Adult Health

## 2014-03-31 ENCOUNTER — Ambulatory Visit (INDEPENDENT_AMBULATORY_CARE_PROVIDER_SITE_OTHER): Payer: Medicare Other | Admitting: Adult Health

## 2014-03-31 ENCOUNTER — Encounter: Payer: Self-pay | Admitting: Adult Health

## 2014-03-31 VITALS — BP 136/90 | Ht 63.0 in | Wt 257.0 lb

## 2014-03-31 DIAGNOSIS — N946 Dysmenorrhea, unspecified: Secondary | ICD-10-CM | POA: Diagnosis not present

## 2014-03-31 HISTORY — DX: Dysmenorrhea, unspecified: N94.6

## 2014-03-31 MED ORDER — NAPROXEN SODIUM 550 MG PO TABS
ORAL_TABLET | ORAL | Status: DC
Start: 2014-03-31 — End: 2014-07-06

## 2014-03-31 NOTE — Progress Notes (Signed)
Subjective:     Patient ID: Pamela Ford, female   DOB: 01-23-1986, 28 y.o.   MRN: [id number]  HPI [name] is a 76 year old black female,in complaining of painful periods.She says they have always been bad.She would like 1 more child in near future.She had miscarriage in April,2015.  Review of Systems See HPI Reviewed past medical,surgical, social and family history. Reviewed medications and allergies.     Objective:   Physical Exam BP 136/90  Ht  (1.6 m)  Wt 257 lb (116.574 kg)  BMI 45.54 kg/m2  LMP 03/12/2014  Breastfeeding? No   Skin warm and dry.Pelvic: external genitalia is normal in appearance, vagina: scant discharge without odor, cervix:smooth and bulbous, uterus: normal size, shape and contour, non tender, no masses felt, adnexa: no masses or tenderness noted.Disussed options for cramps, like OCs,Depo and IUD but she wants to be pregnant in near future, will try Anaprox.  Assessment:     Dysmenorrhea     Plan:     Take prenatal vitamins Rx Anaprox DS #30 take 1 2-3 x daily prn cramps with 2 refills Follow up in 3 months Review handout on dysmenorrhea Discussed timing of sex to get pregnant

## 2014-03-31 NOTE — Patient Instructions (Signed)

## 2014-04-03 DIAGNOSIS — R51 Headache: Secondary | ICD-10-CM | POA: Diagnosis not present

## 2014-04-03 DIAGNOSIS — Z23 Encounter for immunization: Secondary | ICD-10-CM | POA: Diagnosis not present

## 2014-04-22 DIAGNOSIS — R6889 Other general symptoms and signs: Secondary | ICD-10-CM | POA: Diagnosis not present

## 2014-04-22 DIAGNOSIS — F411 Generalized anxiety disorder: Secondary | ICD-10-CM | POA: Diagnosis not present

## 2014-05-05 ENCOUNTER — Telehealth: Payer: Self-pay | Admitting: Adult Health

## 2014-05-05 NOTE — Telephone Encounter (Signed)
Kim sent rx to pharmacy

## 2014-05-05 NOTE — Telephone Encounter (Signed)
Disregard message from Amy. I spoke with pt. Pt states Naproxen is helping some with cramps. Bleeding is heavier. I spoke with JAG. She advised to increase fluids when taking med and continue med. Pt has an appt in December. Advised to keep that appt and call sooner if needs to be seen before then. Pt voiced understanding. JSY

## 2014-05-13 ENCOUNTER — Encounter: Payer: Medicare Other | Admitting: Adult Health

## 2014-05-20 ENCOUNTER — Encounter: Payer: Medicare Other | Admitting: Adult Health

## 2014-05-20 ENCOUNTER — Encounter: Payer: Self-pay | Admitting: Adult Health

## 2014-05-25 ENCOUNTER — Encounter: Payer: Self-pay | Admitting: Adult Health

## 2014-06-15 DIAGNOSIS — F329 Major depressive disorder, single episode, unspecified: Secondary | ICD-10-CM | POA: Diagnosis not present

## 2014-06-30 ENCOUNTER — Ambulatory Visit: Payer: Medicaid Other | Admitting: Adult Health

## 2014-06-30 ENCOUNTER — Encounter: Payer: Self-pay | Admitting: *Deleted

## 2014-07-06 ENCOUNTER — Ambulatory Visit (INDEPENDENT_AMBULATORY_CARE_PROVIDER_SITE_OTHER): Payer: Medicare Other | Admitting: Adult Health

## 2014-07-06 ENCOUNTER — Encounter: Payer: Self-pay | Admitting: Adult Health

## 2014-07-06 VITALS — BP 140/80 | Ht 63.0 in | Wt 261.5 lb

## 2014-07-06 DIAGNOSIS — A499 Bacterial infection, unspecified: Secondary | ICD-10-CM

## 2014-07-06 DIAGNOSIS — N898 Other specified noninflammatory disorders of vagina: Secondary | ICD-10-CM | POA: Insufficient documentation

## 2014-07-06 DIAGNOSIS — B9689 Other specified bacterial agents as the cause of diseases classified elsewhere: Secondary | ICD-10-CM | POA: Insufficient documentation

## 2014-07-06 DIAGNOSIS — N760 Acute vaginitis: Secondary | ICD-10-CM

## 2014-07-06 HISTORY — DX: Other specified bacterial agents as the cause of diseases classified elsewhere: B96.89

## 2014-07-06 HISTORY — DX: Other specified noninflammatory disorders of vagina: N89.8

## 2014-07-06 LAB — POCT WET PREP (WET MOUNT): WBC WET PREP: POSITIVE

## 2014-07-06 MED ORDER — NAPROXEN SODIUM 550 MG PO TABS: ORAL_TABLET | ORAL | Status: DC

## 2014-07-06 MED ORDER — METRONIDAZOLE 500 MG PO TABS: 500.0000 mg | ORAL_TABLET | Freq: Two times a day (BID) | ORAL | Status: DC

## 2014-07-06 NOTE — Patient Instructions (Signed)
Bacterial Vaginosis Bacterial vaginosis is a vaginal infection that occurs when the normal balance of bacteria in the vagina is disrupted. It results from an overgrowth of certain bacteria. This is the most common vaginal infection in women of childbearing age. Treatment is important to prevent complications, especially in pregnant women, as it can cause a premature delivery. CAUSES  Bacterial vaginosis is caused by an increase in harmful bacteria that are normally present in smaller amounts in the vagina. Several different kinds of bacteria can cause bacterial vaginosis. However, the reason that the condition develops is not fully understood. RISK FACTORS Certain activities or behaviors can put you at an increased risk of developing bacterial vaginosis, including:  Having a new sex partner or multiple sex partners.  Douching.  Using an intrauterine device (IUD) for contraception. Women do not get bacterial vaginosis from toilet seats, bedding, swimming pools, or contact with objects around them. SIGNS AND SYMPTOMS  Some women with bacterial vaginosis have no signs or symptoms. Common symptoms include:  Grey vaginal discharge.  A fishlike odor with discharge, especially after sexual intercourse.  Itching or burning of the vagina and vulva.  Burning or pain with urination. DIAGNOSIS  Your health care provider will take a medical history and examine the vagina for signs of bacterial vaginosis. A sample of vaginal fluid may be taken. Your health care provider will look at this sample under a microscope to check for bacteria and abnormal cells. A vaginal pH test may also be done.  TREATMENT  Bacterial vaginosis may be treated with antibiotic medicines. These may be given in the form of a pill or a vaginal cream. A second round of antibiotics may be prescribed if the condition comes back after treatment.  HOME CARE INSTRUCTIONS   Only take over-the-counter or prescription medicines as  directed by your health care provider.  If antibiotic medicine was prescribed, take it as directed. Make sure you finish it even if you start to feel better.  Do not have sex until treatment is completed.  Tell all sexual partners that you have a vaginal infection. They should see their health care provider and be treated if they have problems, such as a mild rash or itching.  Practice safe sex by using condoms and only having one sex partner. SEEK MEDICAL CARE IF:   Your symptoms are not improving after 3 days of treatment.  You have increased discharge or pain.  You have a fever. MAKE SURE YOU:   Understand these instructions.  Will watch your condition.  Will get help right away if you are not doing well or get worse. FOR MORE INFORMATION  Centers for Disease Control and Prevention, Division of STD Prevention: SolutionApps.co.zawww.cdc.gov/std American Sexual Health Association (ASHA): www.ashastd.org  Document Released: 07/10/2005 Document Revised: 04/30/2013 Document Reviewed: 02/19/2013 [facility] Patient Information 2015 [employer], [place]. This information is not intended to replace advice given to you by your health care provider. Make sure you discuss any questions you have with your health care provider. No alcohol or sex during treatment Follow up prn

## 2014-07-06 NOTE — Progress Notes (Signed)
Subjective:     Patient ID: Pamela Ford, female   DOB: 1986/06/10, 28 y.o.   MRN: [id number]  HPI Pamela Ford is a 28 year old black female in complaining of vaginal discharge,sometimes has odor.She requests refill on anaprox ds, says it helps with cramps.  Review of Systems See HPI Reviewed past medical,surgical, social and family history. Reviewed medications and allergies.     Objective:   Physical Exam BP 140/80 mmHg  Ht 5\' 3"  (1.6 m)  Wt 261 lb 8 oz (118.616 kg)  BMI 46.33 kg/m2  LMP 06/16/2014  Breastfeeding? No Skin warm and dry.Pelvic: external genitalia is normal in appearance, vagina: white discharge with odor, cervix:smooth and bulbous, uterus: normal size, shape and contour, non tender, no masses felt, adnexa: no masses or tenderness noted. Wet prep: + for clue cells and +WBCs.     Assessment:     Vaginal discharge BV    Plan:    No sex during treatment Rx flagyl 500 mg 1 bid x 7 days, no alcohol, review handout on BV   Refilled anaprox Ds #30 with 3 refills Follow up prn

## 2014-08-25 ENCOUNTER — Other Ambulatory Visit (HOSPITAL_COMMUNITY): Payer: Self-pay | Admitting: Internal Medicine

## 2014-08-25 DIAGNOSIS — R1030 Lower abdominal pain, unspecified: Secondary | ICD-10-CM

## 2014-08-25 DIAGNOSIS — E669 Obesity, unspecified: Secondary | ICD-10-CM | POA: Diagnosis not present

## 2014-08-25 DIAGNOSIS — R109 Unspecified abdominal pain: Secondary | ICD-10-CM | POA: Diagnosis not present

## 2014-08-26 ENCOUNTER — Emergency Department (HOSPITAL_COMMUNITY)
Admission: EM | Admit: 2014-08-26 | Discharge: 2014-08-27 | Disposition: A | Discharge status: Home / Self Care | Payer: Medicare Other | Attending: Emergency Medicine | Admitting: Emergency Medicine

## 2014-08-26 ENCOUNTER — Encounter (HOSPITAL_COMMUNITY): Payer: Self-pay | Admitting: *Deleted

## 2014-08-26 DIAGNOSIS — B9689 Other specified bacterial agents as the cause of diseases classified elsewhere: Secondary | ICD-10-CM | POA: Diagnosis not present

## 2014-08-26 DIAGNOSIS — I10 Essential (primary) hypertension: Secondary | ICD-10-CM | POA: Insufficient documentation

## 2014-08-26 DIAGNOSIS — F419 Anxiety disorder, unspecified: Secondary | ICD-10-CM | POA: Insufficient documentation

## 2014-08-26 DIAGNOSIS — F329 Major depressive disorder, single episode, unspecified: Secondary | ICD-10-CM | POA: Insufficient documentation

## 2014-08-26 DIAGNOSIS — R112 Nausea with vomiting, unspecified: Secondary | ICD-10-CM | POA: Diagnosis not present

## 2014-08-26 DIAGNOSIS — E669 Obesity, unspecified: Secondary | ICD-10-CM | POA: Insufficient documentation

## 2014-08-26 DIAGNOSIS — K219 Gastro-esophageal reflux disease without esophagitis: Secondary | ICD-10-CM | POA: Insufficient documentation

## 2014-08-26 DIAGNOSIS — R05 Cough: Secondary | ICD-10-CM | POA: Insufficient documentation

## 2014-08-26 DIAGNOSIS — Z79899 Other long term (current) drug therapy: Secondary | ICD-10-CM | POA: Insufficient documentation

## 2014-08-26 DIAGNOSIS — E119 Type 2 diabetes mellitus without complications: Secondary | ICD-10-CM | POA: Insufficient documentation

## 2014-08-26 DIAGNOSIS — Z791 Long term (current) use of non-steroidal anti-inflammatories (NSAID): Secondary | ICD-10-CM | POA: Diagnosis not present

## 2014-08-26 DIAGNOSIS — N939 Abnormal uterine and vaginal bleeding, unspecified: Secondary | ICD-10-CM | POA: Diagnosis not present

## 2014-08-26 DIAGNOSIS — Z720 Tobacco use: Secondary | ICD-10-CM | POA: Insufficient documentation

## 2014-08-26 DIAGNOSIS — N390 Urinary tract infection, site not specified: Secondary | ICD-10-CM | POA: Insufficient documentation

## 2014-08-26 LAB — URINE MICROSCOPIC-ADD ON

## 2014-08-26 LAB — URINALYSIS, ROUTINE W REFLEX MICROSCOPIC
GLUCOSE, UA: NEGATIVE mg/dL
Ketones, ur: 15 mg/dL — AB
Nitrite: POSITIVE — AB
Protein, ur: >300 mg/dL — AB
Specific Gravity, Urine: 1.02 (ref 1.005–1.030)
Urobilinogen, UA: 2 mg/dL — ABNORMAL HIGH (ref 0.0–1.0)
pH: 6.5 (ref 5.0–8.0)

## 2014-08-26 LAB — PREGNANCY, URINE: Preg Test, Ur: NEGATIVE

## 2014-08-26 MED ORDER — ONDANSETRON 8 MG PO TBDP
8.0000 mg | ORAL_TABLET | Freq: Once | ORAL | Status: AC
  Administered 2014-08-26: 8 mg via ORAL
  Filled 2014-08-26: qty 1

## 2014-08-26 MED ORDER — DIPHENHYDRAMINE HCL 50 MG/ML IJ SOLN
50.0000 mg | Freq: Once | INTRAMUSCULAR | Status: AC
  Administered 2014-08-26: 50 mg via INTRAMUSCULAR
  Filled 2014-08-26: qty 1

## 2014-08-26 MED ORDER — METOCLOPRAMIDE HCL 5 MG/ML IJ SOLN
10.0000 mg | Freq: Once | INTRAMUSCULAR | Status: AC
  Administered 2014-08-26: 10 mg via INTRAMUSCULAR
  Filled 2014-08-26: qty 2

## 2014-08-26 MED ORDER — CEPHALEXIN 500 MG PO CAPS
500.0000 mg | ORAL_CAPSULE | Freq: Once | ORAL | Status: AC
Start: 2014-08-27 — End: 2014-08-27
  Administered 2014-08-27: 500 mg via ORAL
  Filled 2014-08-26: qty 1

## 2014-08-26 NOTE — ED Notes (Signed)
Patient states NV X2 days with abdominal pain. Patient denies diarrhea. A&OX4

## 2014-08-26 NOTE — ED Notes (Addendum)
Pt reporting generalized abdominal cramping. States she has had nausea and vomiting for the past 2 days.  Denies diarrhea. Denies any urinary symptoms.

## 2014-08-26 NOTE — ED Provider Notes (Signed)
CSN: [id number]     Arrival date & time 08/26/14  2049 History  This chart was scribed for Joya Gaskins, MD by Tonye Royalty, ED Scribe. This patient was seen in room APA04/APA04 and the patient's care was started at 11:19 PM.    Chief Complaint  Patient presents with  . Abdominal Pain   Patient is a 29 y.o. female presenting with vomiting. The history is provided by the patient. No language interpreter was used.  Emesis Severity:  Mild Duration:  2 days Timing: with PO intake. Emesis appearance: fluid, mucous, possibly blood. Feeding tolerance: unable to tolerate liquids or solids. Onset of vomiting after eating: immediately. Progression:  Unchanged Chronicity:  Recurrent Recent urination:  Normal Context: not post-tussive and not self-induced   Relieved by:  None tried Worsened by:  Nothing tried Associated symptoms: abdominal pain   Associated symptoms: no diarrhea   Risk factors: no diabetes, no prior abdominal surgery and no suspect food intake   Risk factors comment:  Currently on period   HPI Comments: AYELET GRUENEWALD is a 29 y.o. female who presents to the Emergency Department complaining of nausea and vomiting with onset yesterday. She states she has not been able to tolerate fluid or solids. She states that the first few times have been mucous and the fluid she drank previously, but states she might have had some blood on the last one. She reports associated lower abdominal pain near her bilateral ovaries with onset yesterday, preceding her vomiting. She states she has had a mild cough. She notes that she started her period yesterday and states she sometimes has similar symptoms with periods. She denies history of DM or HTN and states she is generally healthy. She denies any prior surgeries. She states her last BM was yesterday and normal without blood or dark stool. She denies fever, chest pain, or dysuria.  Past Medical History  Diagnosis Date  . Acid reflux   .  Depression   . Anxiety   . Impacted tooth 09/2013    impacted wisdom teeth  . Leg pain, right     unknown cause, is being evaluated  . Diabetes mellitus without complication     borderline  . Obesity   . Dysmenorrhea 03/31/2014  . Hypertension   . Vaginal discharge 07/06/2014  . BV (bacterial vaginosis) 07/06/2014   Past Surgical History  Procedure Laterality Date  . No past surgeries    . Tooth extraction N/A 10/06/2013    Procedure: EXTRACTION THIRD MOLARS;  Surgeon: Georgia Lopes, DDS;  Location: [facility];  Service: Oral Surgery;  Laterality: N/A;   Family History  Problem Relation Age of Onset  . Diabetes Mother   . Hypertension Mother   . Diabetes Father   . Preterm labor Son     preterm delivery at 35 weeks  . Kidney disease Maternal Grandmother     was on dialysis  . Heart disease Maternal Grandmother   . Heart attack Maternal Grandfather   . Heart attack Paternal Grandfather    History  Substance Use Topics  . Smoking status: Current Every Day Smoker -- 0.03 packs/day for 5 years    Types: Cigarettes  . Smokeless tobacco: Never Used     Comment: 3 cig./day  . Alcohol Use: No   OB History    Gravida Para Term Preterm AB TAB SAB Ectopic Multiple Living   2 1 0 1 1 0 1 0 0 1  Review of Systems  Constitutional: Negative for fever.  Respiratory: Positive for cough.   Cardiovascular: Negative for chest pain.  Gastrointestinal: Positive for nausea, vomiting and abdominal pain. Negative for diarrhea, constipation and blood in stool.  Genitourinary: Positive for vaginal bleeding. Negative for dysuria.  All other systems reviewed and are negative.     Allergies  Review of patient's allergies indicates no known allergies.  Home Medications   Prior to Admission medications   Medication Sig Start Date End Date Taking? Authorizing Provider  busPIRone (BUSPAR) 15 MG tablet 15 mg 3 (three) times daily.  06/15/14   Historical Provider, MD   cephALEXin (KEFLEX) 500 MG capsule Take 1 capsule (500 mg total) by mouth 4 (four) times daily. 08/27/14   Joya Gaskins, MD  famotidine (PEPCID) 20 MG tablet Take 1 tablet (20 mg total) by mouth 2 (two) times daily. 03/17/14   Vanetta Mulders, MD  ibuprofen (ADVIL,MOTRIN) 800 MG tablet Take 1 tablet (800 mg total) by mouth every 8 (eight) hours as needed for mild pain. 03/09/14   Kristen N Ward, DO  naproxen sodium (ANAPROX) 550 MG tablet Take 1  2-3 times daily prn for periods cramps 07/06/14   Adline Potter, NP  PARoxetine (PAXIL) 40 MG tablet 40 mg daily.  03/27/14   Historical Provider, MD   BP 117/73 mmHg  Pulse 65  Temp(Src) 98.3 F (36.8 C) (Oral)  Resp 17  Ht  (1.6 m)  Wt 258 lb (117.028 kg)  BMI 45.71 kg/m2  SpO2 99%  LMP 08/26/2014 Physical Exam  Nursing note and vitals reviewed.  CONSTITUTIONAL: Well developed/well nourished HEAD: Normocephalic/atraumatic EYES: EOMI/PERRL ENMT: Mucous membranes moist NECK: supple no meningeal signs SPINE/BACK:entire spine nontender CV: S1/S2 noted, no murmurs/rubs/gallops noted LUNGS: Lungs are clear to auscultation bilaterally, no apparent distress ABDOMEN: soft, mild bilateral lower quadrant tenderness, no rebound or guarding, bowel sounds noted throughout abdomen GU:no cva tenderness, pelvic deferred per patient request NEURO: Pt is awake/alert/appropriate, moves all extremitiesx4.  No facial droop.   EXTREMITIES: pulses normal/equal, full ROM SKIN: warm, color normal PSYCH: no abnormalities of mood noted, alert and oriented to situation  ED Course  Procedures   DIAGNOSTIC STUDIES: Oxygen Saturation is 100% on room air, normal by my interpretation.    COORDINATION OF CARE: 11:25 PM Discussed treatment plan with patient at beside, including nausea medication and UA. She states she recently had a pelvic exam and prefers not to have one here. The patient agrees with the plan and has no further questions at this  time.   Labs Review Labs Reviewed  URINALYSIS, ROUTINE W REFLEX MICROSCOPIC - Abnormal; Notable for the following:    Color, Urine RED (*)    APPearance CLOUDY (*)    Hgb urine dipstick LARGE (*)    Bilirubin Urine SMALL (*)    Ketones, ur 15 (*)    Protein, ur >300 (*)    Urobilinogen, UA 2.0 (*)    Nitrite POSITIVE (*)    Leukocytes, UA MODERATE (*)    All other components within normal limits  URINE MICROSCOPIC-ADD ON - Abnormal; Notable for the following:    Bacteria, UA MANY (*)    All other components within normal limits  PREGNANCY, URINE    Medications  ondansetron (ZOFRAN-ODT) disintegrating tablet 8 mg (8 mg Oral Given 08/26/14 2259)  metoCLOPramide (REGLAN) injection 10 mg (10 mg Intramuscular Given 08/26/14 2327)  diphenhydrAMINE (BENADRYL) injection 50 mg (50 mg Intramuscular Given 08/26/14 2328)  cephALEXin (  KEFLEX) capsule 500 mg (500 mg Oral Given 08/27/14 0010)    11:56 PM Discussed with patient that her labs reveal evidence of UTI, which I will treat with antibiotic.  Pt well appearing, no vomiting here She deferred pelvic exam as her low abd cramping usually associated with her menstrual cycle She is well appearing and appropriate for d/c home BP 117/73 mmHg  Pulse 65  Temp(Src) 98.3 F (36.8 C) (Oral)  Resp 17  Ht 5\' 3"  (1.6 m)  Wt 258 lb (117.028 kg)  BMI 45.71 kg/m2  SpO2 99%  LMP 08/26/2014   MDM   Final diagnoses:  UTI (lower urinary tract infection)  Non-intractable vomiting with nausea, vomiting of unspecified type    Nursing notes including past medical history and social history reviewed and considered in documentation Labs/vital reviewed myself and considered during evaluation    I personally performed the services described in this documentation, which was scribed in my presence. The recorded information has been reviewed and is accurate.      Joya Gaskinsonald W Jeneva Schweizer, MD 08/27/14 [phone]

## 2014-08-27 ENCOUNTER — Encounter: Payer: Medicare Other | Admitting: Adult Health

## 2014-08-27 DIAGNOSIS — R112 Nausea with vomiting, unspecified: Secondary | ICD-10-CM | POA: Diagnosis not present

## 2014-08-27 MED ORDER — CEPHALEXIN 500 MG PO CAPS: 500.0000 mg | ORAL_CAPSULE | Freq: Four times a day (QID) | ORAL | Status: DC

## 2014-08-27 NOTE — Discharge Instructions (Signed)

## 2014-08-31 ENCOUNTER — Ambulatory Visit (HOSPITAL_COMMUNITY)
Admission: RE | Admit: 2014-08-31 | Discharge: 2014-08-31 | Disposition: A | Discharge status: Home / Self Care | Payer: Medicare Other | Source: Ambulatory Visit | Attending: Internal Medicine | Admitting: Internal Medicine

## 2014-08-31 DIAGNOSIS — R109 Unspecified abdominal pain: Secondary | ICD-10-CM | POA: Diagnosis not present

## 2014-08-31 DIAGNOSIS — R1030 Lower abdominal pain, unspecified: Secondary | ICD-10-CM

## 2014-09-01 ENCOUNTER — Telehealth: Payer: Self-pay | Admitting: Obstetrics & Gynecology

## 2014-09-01 NOTE — Telephone Encounter (Signed)
Pt states taking Keflex for bladder infection. Pt states Keflex is giving her Migraines. Pt states she is not pregnant. Informed pt to take tylenol and see if helps with the migraines, if no improvement call our office back. Pt states she does have a PCP but "it's hard to contact him." Pt to call our office back if no improvement.

## 2014-09-03 ENCOUNTER — Encounter: Payer: Self-pay | Admitting: Adult Health

## 2014-09-03 ENCOUNTER — Ambulatory Visit (INDEPENDENT_AMBULATORY_CARE_PROVIDER_SITE_OTHER): Payer: Medicare Other | Admitting: Adult Health

## 2014-09-03 VITALS — BP 130/90 | Ht 63.0 in | Wt 259.0 lb

## 2014-09-03 DIAGNOSIS — Z3202 Encounter for pregnancy test, result negative: Secondary | ICD-10-CM

## 2014-09-03 DIAGNOSIS — N926 Irregular menstruation, unspecified: Secondary | ICD-10-CM

## 2014-09-03 DIAGNOSIS — N390 Urinary tract infection, site not specified: Secondary | ICD-10-CM

## 2014-09-03 HISTORY — DX: Irregular menstruation, unspecified: N92.6

## 2014-09-03 HISTORY — DX: Urinary tract infection, site not specified: N39.0

## 2014-09-03 LAB — POCT URINALYSIS DIPSTICK
Glucose, UA: NEGATIVE
LEUKOCYTES UA: NEGATIVE
NITRITE UA: NEGATIVE
Protein, UA: NEGATIVE

## 2014-09-03 LAB — POCT URINE PREGNANCY: Preg Test, Ur: NEGATIVE

## 2014-09-03 MED ORDER — PROMETHAZINE HCL 25 MG PO TABS: 25.0000 mg | ORAL_TABLET | Freq: Four times a day (QID) | ORAL | Status: DC | PRN

## 2014-09-03 NOTE — Patient Instructions (Signed)
Keep period calendar Take prenatal vitamins Preparing for Pregnancy Before trying to become pregnant, make an appointment with your health care provider (preconception care). The goal is to help you have a healthy, safe pregnancy. At your first appointment, your health care provider will:   Do a complete physical exam, including a Pap test.  Take a complete medical history.  Give you advice and help you resolve any problems. PRECONCEPTION CHECKLIST Here is a list of the basics to cover with your health care provider at your preconception visit:  Medical history.  Tell your health care provider about any diseases you have had. Many diseases can affect your pregnancy.  Include your partner's medical history and family history.  Make sure you have been tested for sexually transmitted infections (STIs). These can affect your pregnancy. In some cases, they can be passed to your baby. Tell your health care provider about any history of STIs.  Make sure your health care provider knows about any previous problems you have had with conception or pregnancy.  Tell your health care provider about any medicine you take. This includes herbal supplements and over-the-counter medicines.  Make sure all your immunizations are up to date. You may need to make additional appointments.  Ask your health care provider if you need any vaccinations or if there are any you should avoid.  Diet.  It is especially important to eat a healthy, balanced diet with the right nutrients when you are pregnant.  Ask your health care provider to help you get to a healthy weight before pregnancy.  If you are overweight, you are at higher risk for certain complications. These include high blood pressure, diabetes, and preterm birth.  If you are underweight, you are more likely to have a low-birth-weight baby.  Lifestyle.  Tell your health care provider about lifestyle factors such as alcohol use, drug use, or  smoking.  Describe any harmful substances you may be exposed to at work or home. These can include chemicals, pesticides, and radiation.  Mental health.  Let your health care provider know if you have been feeling depressed or anxious.  Let your health care provider know if you have a history of substance abuse.  Let your health care provider know if you do not feel safe at home. HOME INSTRUCTIONS TO PREPARE FOR PREGNANCY Follow your health care provider's advice and instructions.   Keep an accurate record of your menstrual periods. This makes it easier for your health care provider to determine your baby's due date.  Begin taking prenatal vitamins and folic acid supplements daily. Take them as directed by your health care provider.  Eat a balanced diet. Get help from a nutrition counselor if you have questions or need help.  Get regular exercise. Try to be active for at least 30 minutes a day most days of the week.  Quit smoking, if you smoke.  Do not drink alcohol.  Do not take illegal drugs.  Get medical problems, such as diabetes or high blood pressure, under control.  If you have diabetes, make sure you do the following:  Have good blood sugar control. If you have type 1 diabetes, use multiple daily doses of insulin. Do not use split-dose or premixed insulin.  Have an eye exam by a qualified eye care professional trained in caring for people with diabetes.  Get evaluated by your health care provider for cardiovascular disease.  Get to a healthy weight. If you are overweight or obese, reduce your weight with  the help of a qualified health professional such as a Museum/gallery exhibitions officer. Ask your health care provider what the right weight range is for you. HOW DO I KNOW I AM PREGNANT? You may be pregnant if you have been sexually active and you miss your period. Symptoms of early pregnancy include:   Mild cramping.  Very light vaginal bleeding (spotting).  Feeling  unusually tired.  Morning sickness. If you have any of these symptoms, take a home pregnancy test. These tests look for a hormone called human chorionic gonadotropin (hCG) in your urine. Your body begins to make this hormone during early pregnancy. These tests are very accurate. Wait until at least the first day you miss your period to take one. If you get a positive result, call your health care provider to make appointments for prenatal care. WHAT SHOULD I DO IF I BECOME PREGNANT?  Make an appointment with your health care provider by week 12 of your pregnancy at the latest.  Do not smoke. Smoking can be harmful to your baby.  Do not drink alcoholic beverages. Alcohol is related to a number of birth defects.  Avoid toxic odors and chemicals.  You may continue to have sexual intercourse if it does not cause pain or other problems, such as vaginal bleeding. Document Released: 06/22/2008 Document Revised: 11/24/2013 Document Reviewed: 06/16/2013 [facility] Patient Information 2015 [place], [place]. This information is not intended to replace advice given to you by your health care provider. Make sure you discuss any questions you have with your health care provider.

## 2014-09-03 NOTE — Progress Notes (Signed)
Subjective:     Patient ID: Pamela Ford, female   DOB: 11-22-1985, 29 y.o.   MRN: [id number]  HPI Pamela Ford is a 29 year old black female, here for follow up of being seen in ER last week for Nausea and vomiting.She has had irregular periods.When seen in ER was found to have UTI and was treated with Keflex, no culture performed.Still has nausea, no pain and period started.She "kinda" wants to be pregnant.  Review of Systems  Patient denies any headaches, hearing loss, fatigue, blurred vision, shortness of breath, chest pain, abdominal pain, problems with bowel movements, urination, or intercourse. No joint pain or mood swings. See HPI for positives.  Reviewed past medical,surgical, social and family history. Reviewed medications and allergies.     Objective:   Physical Exam BP 130/90 mmHg  Ht 5\' 3"  (1.6 m)  Wt 259 lb (117.482 kg)  BMI 45.89 kg/m2  LMP 08/25/2014  Breastfeeding? Nourine trace blood, UPT negative, skin warm and dry, abdomen soft, non tender, no masses, No CVAT. Discussed keeping period calendar and timing of sex of wants to be pregnant and to take prenatal vitamin.Finish keflex.    Assessment:     Irregular periods  UTI    Plan:    Keep period calendar UA C&S sent Take prenatal vitamins Rx phenergan 25 mg #30 1 every 6 hours prn N/V with 1 refill   Follow up prn

## 2014-09-04 LAB — MICROSCOPIC EXAMINATION
BACTERIA UA: NONE SEEN
Casts: NONE SEEN /lpf

## 2014-09-04 LAB — URINALYSIS, ROUTINE W REFLEX MICROSCOPIC
BILIRUBIN UA: NEGATIVE
GLUCOSE, UA: NEGATIVE
KETONES UA: NEGATIVE
Nitrite, UA: NEGATIVE
Protein, UA: NEGATIVE
RBC, UA: NEGATIVE
Specific Gravity, UA: 1.009 (ref 1.005–1.030)
Urobilinogen, Ur: 0.2 mg/dL (ref 0.2–1.0)
pH, UA: 6 (ref 5.0–7.5)

## 2014-09-05 LAB — URINE CULTURE: ORGANISM ID, BACTERIA: NO GROWTH

## 2014-09-16 ENCOUNTER — Telehealth: Payer: Self-pay | Admitting: Adult Health

## 2014-09-16 ENCOUNTER — Ambulatory Visit: Payer: Medicare Other | Admitting: Adult Health

## 2014-09-16 NOTE — Telephone Encounter (Signed)
Spoke with pt. Pt was given an antibiotic from the ER and now she thinks she has a yeast infection. I advised she would need to be seen. Pt voiced understanding. Call transferred to front desk for appt. JSY

## 2014-09-21 ENCOUNTER — Ambulatory Visit (INDEPENDENT_AMBULATORY_CARE_PROVIDER_SITE_OTHER): Payer: Medicare Other | Admitting: Adult Health

## 2014-09-21 ENCOUNTER — Encounter: Payer: Self-pay | Admitting: Adult Health

## 2014-09-21 VITALS — BP 136/80 | HR 93 | Ht 63.0 in | Wt 255.0 lb

## 2014-09-21 DIAGNOSIS — R300 Dysuria: Secondary | ICD-10-CM | POA: Insufficient documentation

## 2014-09-21 DIAGNOSIS — B379 Candidiasis, unspecified: Secondary | ICD-10-CM | POA: Diagnosis not present

## 2014-09-21 DIAGNOSIS — N898 Other specified noninflammatory disorders of vagina: Secondary | ICD-10-CM | POA: Diagnosis not present

## 2014-09-21 HISTORY — DX: Candidiasis, unspecified: B37.9

## 2014-09-21 LAB — POCT URINALYSIS DIPSTICK

## 2014-09-21 LAB — POCT WET PREP (WET MOUNT): WBC, Wet Prep HPF POC: POSITIVE

## 2014-09-21 MED ORDER — FLUCONAZOLE 150 MG PO TABS: 150.0000 mg | ORAL_TABLET | Freq: Once | ORAL | Status: DC

## 2014-09-21 MED ORDER — TERCONAZOLE 0.4 % VA CREA: 1.0000 | TOPICAL_CREAM | Freq: Every day | VAGINAL | Status: DC

## 2014-09-21 NOTE — Patient Instructions (Signed)
Increase water  Take diflucan and use terazol Monilial Vaginitis Vaginitis in a soreness, swelling and redness (inflammation) of the vagina and vulva. Monilial vaginitis is not a sexually transmitted infection. CAUSES  Yeast vaginitis is caused by yeast (candida) that is normally found in your vagina. With a yeast infection, the candida has overgrown in number to a point that upsets the chemical balance. SYMPTOMS   White, thick vaginal discharge.  Swelling, itching, redness and irritation of the vagina and possibly the lips of the vagina (vulva).  Burning or painful urination.  Painful intercourse. DIAGNOSIS  Things that may contribute to monilial vaginitis are:  Postmenopausal and virginal states.  Pregnancy.  Infections.  Being tired, sick or stressed, especially if you had monilial vaginitis in the past.  Diabetes. Good control will help lower the chance.  Birth control pills.  Tight fitting garments.  Using bubble bath, feminine sprays, douches or deodorant tampons.  Taking certain medications that kill germs (antibiotics).  Sporadic recurrence can occur if you become ill. TREATMENT  Your caregiver will give you medication.  There are several kinds of anti monilial vaginal creams and suppositories specific for monilial vaginitis. For recurrent yeast infections, use a suppository or cream in the vagina 2 times a week, or as directed.  Anti-monilial or steroid cream for the itching or irritation of the vulva may also be used. Get your caregiver's permission.  Painting the vagina with methylene blue solution may help if the monilial cream does not work.  Eating yogurt may help prevent monilial vaginitis. HOME CARE INSTRUCTIONS   Finish all medication as prescribed.  Do not have sex until treatment is completed or after your caregiver tells you it is okay.  Take warm sitz baths.  Do not douche.  Do not use tampons, especially scented ones.  Wear cotton  underwear.  Avoid tight pants and panty hose.  Tell your sexual partner that you have a yeast infection. They should go to their caregiver if they have symptoms such as mild rash or itching.  Your sexual partner should be treated as well if your infection is difficult to eliminate.  Practice safer sex. Use condoms.  Some vaginal medications cause latex condoms to fail. Vaginal medications that harm condoms are:  Cleocin cream.  Butoconazole (Femstat).  Terconazole (Terazol) vaginal suppository.  Miconazole (Monistat) (may be purchased over the counter). SEEK MEDICAL CARE IF:   You have a temperature by mouth above 102 F (38.9 C).  The infection is getting worse after 2 days of treatment.  The infection is not getting better after 3 days of treatment.  You develop blisters in or around your vagina.  You develop vaginal bleeding, and it is not your menstrual period.  You have pain when you urinate.  You develop intestinal problems.  You have pain with sexual intercourse. Document Released: 04/19/2005 Document Revised: 10/02/2011 Document Reviewed: 01/01/2009 [facility] Patient Information 2015 [place]. This information is not intended to replace advice given to you by your health care provider. Make sure you discuss any questions you have with your health care provider.

## 2014-09-21 NOTE — Progress Notes (Signed)
Subjective:     Patient ID: Pamela Ford, female   DOB: July 29, 1985, 29 y.o.   MRN: [id number]  HPI Pamela Ford is a 29 year old black female in complaining of vaginal discharge and itch,just finished antibiotics for UTI and she says it burns when she pees.  Review of Systems Patient denies any headaches, hearing loss, fatigue, blurred vision, shortness of breath, chest pain, abdominal pain, problems with bowel movements, or intercourse. No joint pain or mood swings.See HPI for positives Reviewed past medical,surgical, social and family history. Reviewed medications and allergies.     Objective:   Physical Exam BP 136/80 mmHg  Pulse 93  Ht 5\' 3"  (1.6 m)  Wt 255 lb (115.667 kg)  BMI 45.18 kg/m2  LMP 02/02/2016urine 2+ leuks,trace protein and 1+ blood had negative culture 09/03/14. Skin warm and dry.Pelvic: external genitalia is normal in appearance no lesions, vagina: white discharge withoutodor,urethra no lesions or masses noted, cervix:smooth and bulbous, uterus: normal size, shape and contour, non tender, no masses felt, adnexa: no masses or tenderness noted. Bladder is non tender and no masses felt. Wet prep: + for yeast+WBCs.     Assessment:     Vaginal discharge Yeast infection Burning with urination    Plan:     Rx diflucan 150 mg #1 take now with 1 refill Rx terazol 7 cream use 1 applicator in vagina at hs x 7 nights Push fluids,esp water Follow up prn    Review handout on yeast

## 2014-09-28 DIAGNOSIS — F329 Major depressive disorder, single episode, unspecified: Secondary | ICD-10-CM | POA: Diagnosis not present

## 2014-11-02 ENCOUNTER — Telehealth: Payer: Self-pay | Admitting: Adult Health

## 2014-11-02 MED ORDER — NAPROXEN SODIUM 550 MG PO TABS: ORAL_TABLET | ORAL | Status: DC

## 2014-11-02 NOTE — Telephone Encounter (Signed)
Spoke with pt. Pt is requesting a refill on Anaprox for period cramps. Thanks!! JSY

## 2014-11-02 NOTE — Telephone Encounter (Signed)
Will refill anaprox

## 2014-12-04 ENCOUNTER — Telehealth: Payer: Self-pay | Admitting: *Deleted

## 2014-12-04 NOTE — Telephone Encounter (Signed)
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 Pt states that it is sometimes hard to hold it. Pt states that sometimes is just comes out. Pt was advised to make an appointment with a provider to discuss the issue.

## 2014-12-07 ENCOUNTER — Ambulatory Visit: Payer: Medicare Other | Admitting: Obstetrics and Gynecology

## 2014-12-10 ENCOUNTER — Ambulatory Visit: Payer: Medicare Other | Admitting: Obstetrics and Gynecology

## 2014-12-22 DIAGNOSIS — F329 Major depressive disorder, single episode, unspecified: Secondary | ICD-10-CM | POA: Diagnosis not present

## 2014-12-23 ENCOUNTER — Ambulatory Visit: Payer: Medicare Other | Admitting: Obstetrics and Gynecology

## 2015-02-16 ENCOUNTER — Ambulatory Visit: Payer: Medicare Other | Admitting: Adult Health

## 2015-02-16 ENCOUNTER — Encounter: Payer: Self-pay | Admitting: Adult Health

## 2015-03-03 ENCOUNTER — Ambulatory Visit: Payer: Medicare Other | Admitting: Adult Health

## 2015-03-03 ENCOUNTER — Encounter: Payer: Self-pay | Admitting: Adult Health

## 2015-03-11 ENCOUNTER — Encounter: Payer: Self-pay | Admitting: *Deleted

## 2015-03-11 ENCOUNTER — Ambulatory Visit: Payer: Medicare Other | Admitting: Adult Health

## 2015-03-16 ENCOUNTER — Encounter (HOSPITAL_COMMUNITY): Payer: Self-pay

## 2015-03-16 ENCOUNTER — Emergency Department (HOSPITAL_COMMUNITY)
Admission: EM | Admit: 2015-03-16 | Discharge: 2015-03-16 | Disposition: A | Discharge status: Home / Self Care | Payer: Medicare Other | Attending: Emergency Medicine | Admitting: Emergency Medicine

## 2015-03-16 ENCOUNTER — Emergency Department (HOSPITAL_COMMUNITY): Payer: Medicare Other

## 2015-03-16 DIAGNOSIS — Z720 Tobacco use: Secondary | ICD-10-CM | POA: Diagnosis not present

## 2015-03-16 DIAGNOSIS — Z79899 Other long term (current) drug therapy: Secondary | ICD-10-CM | POA: Diagnosis not present

## 2015-03-16 DIAGNOSIS — I10 Essential (primary) hypertension: Secondary | ICD-10-CM | POA: Diagnosis not present

## 2015-03-16 DIAGNOSIS — Z87440 Personal history of urinary (tract) infections: Secondary | ICD-10-CM | POA: Insufficient documentation

## 2015-03-16 DIAGNOSIS — E669 Obesity, unspecified: Secondary | ICD-10-CM | POA: Diagnosis not present

## 2015-03-16 DIAGNOSIS — F419 Anxiety disorder, unspecified: Secondary | ICD-10-CM | POA: Insufficient documentation

## 2015-03-16 DIAGNOSIS — Z8619 Personal history of other infectious and parasitic diseases: Secondary | ICD-10-CM | POA: Insufficient documentation

## 2015-03-16 DIAGNOSIS — Z8742 Personal history of other diseases of the female genital tract: Secondary | ICD-10-CM | POA: Insufficient documentation

## 2015-03-16 DIAGNOSIS — R0789 Other chest pain: Secondary | ICD-10-CM

## 2015-03-16 DIAGNOSIS — Z791 Long term (current) use of non-steroidal anti-inflammatories (NSAID): Secondary | ICD-10-CM | POA: Insufficient documentation

## 2015-03-16 DIAGNOSIS — F329 Major depressive disorder, single episode, unspecified: Secondary | ICD-10-CM | POA: Diagnosis not present

## 2015-03-16 DIAGNOSIS — K219 Gastro-esophageal reflux disease without esophagitis: Secondary | ICD-10-CM | POA: Insufficient documentation

## 2015-03-16 DIAGNOSIS — R079 Chest pain, unspecified: Secondary | ICD-10-CM | POA: Diagnosis not present

## 2015-03-16 LAB — BASIC METABOLIC PANEL
Anion gap: 6 (ref 5–15)
BUN: 6 mg/dL (ref 6–20)
CALCIUM: 9.1 mg/dL (ref 8.9–10.3)
CO2: 24 mmol/L (ref 22–32)
CREATININE: 0.86 mg/dL (ref 0.44–1.00)
Chloride: 107 mmol/L (ref 101–111)
Glucose, Bld: 105 mg/dL — ABNORMAL HIGH (ref 65–99)
Potassium: 3.7 mmol/L (ref 3.5–5.1)
SODIUM: 137 mmol/L (ref 135–145)

## 2015-03-16 LAB — TROPONIN I

## 2015-03-16 LAB — CBC WITH DIFFERENTIAL/PLATELET
BASOS PCT: 0 % (ref 0–1)
Basophils Absolute: 0 10*3/uL (ref 0.0–0.1)
EOS ABS: 0.5 10*3/uL (ref 0.0–0.7)
EOS PCT: 8 % — AB (ref 0–5)
HCT: 38.8 % (ref 36.0–46.0)
Hemoglobin: 13.4 g/dL (ref 12.0–15.0)
LYMPHS PCT: 44 % (ref 12–46)
Lymphs Abs: 2.4 10*3/uL (ref 0.7–4.0)
MCH: 32 pg (ref 26.0–34.0)
MCHC: 34.5 g/dL (ref 30.0–36.0)
MCV: 92.6 fL (ref 78.0–100.0)
Monocytes Absolute: 0.3 10*3/uL (ref 0.1–1.0)
Monocytes Relative: 5 % (ref 3–12)
Neutro Abs: 2.5 10*3/uL (ref 1.7–7.7)
Neutrophils Relative %: 43 % (ref 43–77)
PLATELETS: 200 10*3/uL (ref 150–400)
RBC: 4.19 MIL/uL (ref 3.87–5.11)
RDW: 13.6 % (ref 11.5–15.5)
WBC: 5.7 10*3/uL (ref 4.0–10.5)

## 2015-03-16 MED ORDER — GI COCKTAIL ~~LOC~~
30.0000 mL | Freq: Once | ORAL | Status: AC
  Administered 2015-03-16: 30 mL via ORAL
  Filled 2015-03-16: qty 30

## 2015-03-16 MED ORDER — TRAMADOL HCL 50 MG PO TABS: 50.0000 mg | ORAL_TABLET | Freq: Four times a day (QID) | ORAL | Status: DC | PRN

## 2015-03-16 MED ORDER — PANTOPRAZOLE SODIUM 40 MG PO TBEC
40.0000 mg | DELAYED_RELEASE_TABLET | Freq: Once | ORAL | Status: AC
  Administered 2015-03-16: 40 mg via ORAL
  Filled 2015-03-16: qty 1

## 2015-03-16 MED ORDER — ESOMEPRAZOLE MAGNESIUM 40 MG PO CPDR: 40.0000 mg | DELAYED_RELEASE_CAPSULE | Freq: Every day | ORAL | Status: DC

## 2015-03-16 MED ORDER — TRAMADOL HCL 50 MG PO TABS
50.0000 mg | ORAL_TABLET | Freq: Once | ORAL | Status: AC
  Administered 2015-03-16: 50 mg via ORAL
  Filled 2015-03-16: qty 1

## 2015-03-16 MED ORDER — METHYLPREDNISOLONE SODIUM SUCC 125 MG IJ SOLR
INTRAMUSCULAR | Status: AC
  Filled 2015-03-16: qty 2

## 2015-03-16 NOTE — ED Notes (Signed)
Pt verbalized understanding of no driving and to use caution within 4 hours of taking pain meds due to meds cause drowsiness 

## 2015-03-16 NOTE — ED Notes (Signed)
Complain of chest pain that started around 1000. Pt was given ASA 324 and 1 NTG SL by EMS prior to arrival. Pt's pain was 10/10 prior to med's. Rates pain 8/10 now

## 2015-03-16 NOTE — Discharge Instructions (Signed)
Chest Pain (Nonspecific) °It is often hard to give a specific diagnosis for the cause of chest pain. There is always a chance that your pain could be related to something serious, such as a heart attack or a blood clot in the lungs. You need to follow up with your health care provider for further evaluation. °CAUSES  °· Heartburn. °· Pneumonia or bronchitis. °· Anxiety or stress. °· Inflammation around your heart (pericarditis) or lung (pleuritis or pleurisy). °· A blood clot in the lung. °· A collapsed lung (pneumothorax). It can develop suddenly on its own (spontaneous pneumothorax) or from trauma to the chest. °· Shingles infection (herpes zoster virus). °The chest wall is composed of bones, muscles, and cartilage. Any of these can be the source of the pain. °· The bones can be bruised by injury. °· The muscles or cartilage can be strained by coughing or overwork. °· The cartilage can be affected by inflammation and become sore (costochondritis). °DIAGNOSIS  °Lab tests or other studies may be needed to find the cause of your pain. Your health care provider may have you take a test called an ambulatory electrocardiogram (ECG). An ECG records your heartbeat patterns over a 24-hour period. You may also have other tests, such as: °· Transthoracic echocardiogram (TTE). During echocardiography, sound waves are used to evaluate how blood flows through your heart. °· Transesophageal echocardiogram (TEE). °· Cardiac monitoring. This allows your health care provider to monitor your heart rate and rhythm in real time. °· Holter monitor. This is a portable device that records your heartbeat and can help diagnose heart arrhythmias. It allows your health care provider to track your heart activity for several days, if needed. °· Stress tests by exercise or by giving medicine that makes the heart beat faster. °TREATMENT  °· Treatment depends on what may be causing your chest pain. Treatment may include: °· Acid blockers for  heartburn. °· Anti-inflammatory medicine. °· Pain medicine for inflammatory conditions. °· Antibiotics if an infection is present. °· You may be advised to change lifestyle habits. This includes stopping smoking and avoiding alcohol, caffeine, and chocolate. °· You may be advised to keep your head raised (elevated) when sleeping. This reduces the chance of acid going backward from your stomach into your esophagus. °Most of the time, nonspecific chest pain will improve within 2-3 days with rest and mild pain medicine.  °HOME CARE INSTRUCTIONS  °· If antibiotics were prescribed, take them as directed. Finish them even if you start to feel better. °· For the next few days, avoid physical activities that bring on chest pain. Continue physical activities as directed. °· Do not use any tobacco products, including cigarettes, chewing tobacco, or electronic cigarettes. °· Avoid drinking alcohol. °· Only take medicine as directed by your health care provider. °· Follow your health care provider's suggestions for further testing if your chest pain does not go away. °· Keep any follow-up appointments you made. If you do not go to an appointment, you could develop lasting (chronic) problems with pain. If there is any problem keeping an appointment, call to reschedule. °SEEK MEDICAL CARE IF:  °· Your chest pain does not go away, even after treatment. °· You have a rash with blisters on your chest. °· You have a fever. °SEEK IMMEDIATE MEDICAL CARE IF:  °· You have increased chest pain or pain that spreads to your arm, neck, jaw, back, or abdomen. °· You have shortness of breath. °· You have an increasing cough, or you cough   up blood. °· You have severe back or abdominal pain. °· You feel nauseous or vomit. °· You have severe weakness. °· You faint. °· You have chills. °This is an emergency. Do not wait to see if the pain will go away. Get medical help at once. Call your local emergency services (911 in [place] Do not drive  yourself to the hospital. °MAKE SURE YOU:  °· Understand these instructions. °· Will watch your condition. °· Will get help right away if you are not doing well or get worse. °Document Released: 04/19/2005 Document Revised: 07/15/2013 Document Reviewed: 02/13/2008 °[employer]® Patient Information ©2015 [employer]. This information is not intended to replace advice given to you by your health care provider. Make sure you discuss any questions you have with your health care provider. °Gastroesophageal Reflux Disease, Adult °Gastroesophageal reflux disease (GERD) happens when acid from your stomach flows up into the esophagus. When acid comes in contact with the esophagus, the acid causes soreness (inflammation) in the esophagus. Over time, GERD may create small holes (ulcers) in the lining of the esophagus. °CAUSES  °· Increased body weight. This puts pressure on the stomach, making acid rise from the stomach into the esophagus. °· Smoking. This increases acid production in the stomach. °· Drinking alcohol. This causes decreased pressure in the lower esophageal sphincter (valve or ring of muscle between the esophagus and stomach), allowing acid from the stomach into the esophagus. °· Late evening meals and a full stomach. This increases pressure and acid production in the stomach. °· A malformed lower esophageal sphincter. °Sometimes, no cause is found. °SYMPTOMS  °· Burning pain in the lower part of the mid-chest behind the breastbone and in the mid-stomach area. This may occur twice a week or more often. °· Trouble swallowing. °· Sore throat. °· Dry cough. °· Asthma-like symptoms including chest tightness, shortness of breath, or wheezing. °DIAGNOSIS  °Your caregiver may be able to diagnose GERD based on your symptoms. In some cases, X-rays and other tests may be done to check for complications or to check the condition of your stomach and esophagus. °TREATMENT  °Your caregiver may recommend over-the-counter or  prescription medicines to help decrease acid production. Ask your caregiver before starting or adding any new medicines.  °HOME CARE INSTRUCTIONS  °· Change the factors that you can control. Ask your caregiver for guidance concerning weight loss, quitting smoking, and alcohol consumption. °· Avoid foods and drinks that make your symptoms worse, such as: °¨ Caffeine or alcoholic drinks. °¨ Chocolate. °¨ Peppermint or mint flavorings. °¨ Garlic and onions. °¨ Spicy foods. °¨ Citrus fruits, such as oranges, lemons, or limes. °¨ Tomato-based foods such as sauce, chili, salsa, and pizza. °¨ Fried and fatty foods. °· Avoid lying down for the 3 hours prior to your bedtime or prior to taking a nap. °· Eat small, frequent meals instead of large meals. °· Wear loose-fitting clothing. Do not wear anything tight around your waist that causes pressure on your stomach. °· Raise the head of your bed 6 to 8 inches with wood blocks to help you sleep. Extra pillows will not help. °· Only take over-the-counter or prescription medicines for pain, discomfort, or fever as directed by your caregiver. °· Do not take aspirin, ibuprofen, or other nonsteroidal anti-inflammatory drugs (NSAIDs). °SEEK IMMEDIATE MEDICAL CARE IF:  °· You have pain in your arms, neck, jaw, teeth, or back. °· Your pain increases or changes in intensity or duration. °· You develop nausea, vomiting, or sweating (diaphoresis). °·   diaphoresis).  You develop shortness of breath, or you faint.  Your vomit is green, yellow, black, or looks like coffee grounds or blood.  Your stool is red, bloody, or black. These symptoms could be signs of other problems, such as heart disease, gastric bleeding, or esophageal bleeding. MAKE SURE YOU:   Understand these instructions.  Will watch your condition.  Will get help right away if you are not doing well or get worse. Document Released: 04/19/2005 Document Revised: 10/02/2011 Document Reviewed: 01/27/2011 [facility] Patient  Information 2015 [place], [place]. This information is not intended to replace advice given to you by your health care provider. Make sure you discuss any questions you have with your health care provider.  Esomeprazole; naproxen delayed release tablets What is this medicine? ESOMEPRAZOLE; NAPROXEN (es oh ME pray zol; na PROX en) is two medicines together. Naproxen is a non-steroidal anti-inflammatory drug (NSAID). It is used to treat the pain of arthritis. Esomeprazole is a proton pump inhibitor (PPI). It is used to prevent stomach problems from the naproxen. This medicine may be used for other purposes; ask your health care provider or pharmacist if you have questions. COMMON BRAND NAME(S): Vimovo What should I tell my health care provider before I take this medicine? They need to know if you have any of these conditions: -asthma -cigarette smoker -drink more than 3 alcohol containing beverages a day -heart disease or circulation problems such as heart failure or leg edema (fluid retention) -high blood pressure -kidney disease -liver disease -low levels of magnesium in the blood -stomach bleeding or ulcers -an unusual or allergic reaction to naproxen, aspirin, other NSAIDs, esomeprazole or other proton pump inhibitors, other medicines, foods, dyes, or preservatives -pregnant or trying to get pregnant -breast-feeding How should I use this medicine? Take this medicine by mouth with a glass of water. Follow the directions on the prescription label. Do not crush, chew, split, or dissolve. Take this medicine on an empty stomach at least 30 minutes before a meal. Take your medicine at regular intervals. Do not take it more often than directed. Long term, continuous use may increase the risk of heart attack or stroke. A special MedGuide will be given to you by the pharmacist with each prescription and refill. Be sure to read this information carefully each time. Talk to your pediatrician regarding  the use of this medicine in children. Special care may be needed. Overdosage: If you think you've taken too much of this medicine contact a poison control center or emergency room at once. Overdosage: If you think you have taken too much of this medicine contact a poison control center or emergency room at once. NOTE: This medicine is only for you. Do not share this medicine with others. What if I miss a dose? If you miss a dose, take it as soon as you can. If it is almost time for your next dose, take only that dose. Do not take double or extra doses. What may interact with this medicine? Do not take this medicine with any of the following medications: -atazanavir -nelfinavir This medicine may also interact with the following medications: -alcohol -aspirin and aspirin-like medicines -cholestyramine -cidofovir -delavirdine -diazepam -digoxin -diuretics -erlotinib -fosphenytoin or phenytoin for seizures -iron salts -lithium -medicines for blood pressure -medicines for depression, anxiety, or psychotic disturbances -medicines for fungal infections like ketoconazole and itraconazole -medicines for stomach, or intestine problems, like acid reflux or GERD -medicines that treat or prevent blood clots like warfarin, enoxaparin, and dalteparin -methotrexate -other  NSAIDs, medicines for pain and inflammation, like ibuprofen -pemetrexed -probenecid -rifampin -steroid medicines like prednisone or cortisone -sucralfate -sulfonamides -sulfonylureas -St. John's Wort -tacrolimus This list may not describe all possible interactions. Give your health care provider a list of all the medicines, herbs, non-prescription drugs, or dietary supplements you use. Also tell them if you smoke, drink alcohol, or use illegal drugs. Some items may interact with your medicine. What should I watch for while using this medicine? Tell your doctor or health care professional if your pain does not get better.  Talk to your doctor before taking another medicine for pain. Do not treat yourself. You may need blood work done while you are taking this medicine. This medicine does not prevent heart attack or stroke. In fact, this medicine may increase the chance of a heart attack or stroke. The chance may increase with longer use of this medicine and in people who have heart disease. If you take aspirin to prevent heart attack or stroke, talk with your doctor or health care professional. Do not take other medicines that contain aspirin, ibuprofen, or naproxen with this medicine. Side effects such as stomach upset, nausea, or ulcers may be more likely to occur. Many medicines available without a prescription should not be taken with this medicine. This medicine can cause ulcers and bleeding in the stomach and intestines at any time during treatment. Do not smoke cigarettes or drink alcohol. These increase irritation to your stomach and can make it more susceptible to damage from this medicine. Ulcers and bleeding can happen without warning symptoms and can cause death. You may get drowsy or dizzy. Do not drive, use machinery, or do anything that needs mental alertness until you know how this medicine affects you. Do not stand or sit up quickly, especially if you are an older patient. This reduces the risk of dizzy or fainting spells. This medicine can cause you to bleed more easily. Try to avoid damage to your teeth and gums when you brush or floss your teeth. What side effects may I notice from receiving this medicine? Side effects that you should report to your doctor or health care professional as soon as possible: -allergic reactions like skin rash, itching or hives, swelling of the face, lips, or tongue -black or bloody stools, blood in the urine or vomit -blurred vision -bone, muscle or joint pain -chest pain -dark yellow or brown urine -difficulty breathing or wheezing -dizziness -fast, irregular  heartbeat -feeling faint or lightheaded -fever or sore throat -muscle spasms -nausea or vomiting -palpitations -seizures -slurred speech or weakness on one side of the body -stomach pain -tremors -unexplained weight gain or swelling -unusual bleeding or bruising -unusually weak or tired -yellowing of eyes or skin -vomiting Side effects that usually do not require medical attention (report to your doctor or health care professional if they continue or are bothersome): -constipation -diarrhea -dry mouth -headache -heartburn -nausea This list may not describe all possible side effects. Call your doctor for medical advice about side effects. You may report side effects to FDA at 1-800-FDA-1088. Where should I keep my medicine? Keep out of the reach of children. Store at room temperature between 15 and 30 degrees C (59 and 86 degrees F). Protect from light and moisture. Throw away any unused medicine after the expiration date. NOTE: This sheet is a summary. It may not cover all possible information. If you have questions about this medicine, talk to your doctor, pharmacist, or health care provider.  2015, [employer]. (2013-04-30 09:43:16)  Tramadol tablets What is this medicine? TRAMADOL (TRA ma dole) is a pain reliever. It is used to treat moderate to severe pain in adults. This medicine may be used for other purposes; ask your health care provider or pharmacist if you have questions. COMMON BRAND NAME(S): Ultram What should I tell my health care provider before I take this medicine? They need to know if you have any of these conditions: -brain tumor -depression -drug abuse or addiction -head injury -if you frequently drink alcohol containing drinks -kidney disease or trouble passing urine -liver disease -lung disease, asthma, or breathing problems -seizures or epilepsy -suicidal thoughts, plans, or attempt; a previous suicide attempt by you or a family  member -an unusual or allergic reaction to tramadol, codeine, other medicines, foods, dyes, or preservatives -pregnant or trying to get pregnant -breast-feeding How should I use this medicine? Take this medicine by mouth with a full glass of water. Follow the directions on the prescription label. If the medicine upsets your stomach, take it with food or milk. Do not take more medicine than you are told to take. Talk to your pediatrician regarding the use of this medicine in children. Special care may be needed. Overdosage: If you think you have taken too much of this medicine contact a poison control center or emergency room at once. NOTE: This medicine is only for you. Do not share this medicine with others. What if I miss a dose? If you miss a dose, take it as soon as you can. If it is almost time for your next dose, take only that dose. Do not take double or extra doses. What may interact with this medicine? Do not take this medicine with any of the following medications: -MAOIs like Carbex, Eldepryl, Marplan, Nardil, and Parnate This medicine may also interact with the following medications: -alcohol or medicines that contain alcohol -antihistamines -benzodiazepines -bupropion -carbamazepine or oxcarbazepine -clozapine -cyclobenzaprine -digoxin -furazolidone -linezolid -medicines for depression, anxiety, or psychotic disturbances -medicines for migraine headache like almotriptan, eletriptan, frovatriptan, naratriptan, rizatriptan, sumatriptan, zolmitriptan -medicines for pain like pentazocine, buprenorphine, butorphanol, meperidine, nalbuphine, and propoxyphene -medicines for sleep -muscle relaxants -naltrexone -phenobarbital -phenothiazines like perphenazine, thioridazine, chlorpromazine, mesoridazine, fluphenazine, prochlorperazine, promazine, and trifluoperazine -procarbazine -warfarin This list may not describe all possible interactions. Give your health care provider a  list of all the medicines, herbs, non-prescription drugs, or dietary supplements you use. Also tell them if you smoke, drink alcohol, or use illegal drugs. Some items may interact with your medicine. What should I watch for while using this medicine? Tell your doctor or health care professional if your pain does not go away, if it gets worse, or if you have new or a different type of pain. You may develop tolerance to the medicine. Tolerance means that you will need a higher dose of the medicine for pain relief. Tolerance is normal and is expected if you take this medicine for a long time. Do not suddenly stop taking your medicine because you may develop a severe reaction. Your body becomes used to the medicine. This does NOT mean you are addicted. Addiction is a behavior related to getting and using a drug for a non-medical reason. If you have pain, you have a medical reason to take pain medicine. Your doctor will tell you how much medicine to take. If your doctor wants you to stop the medicine, the dose will be slowly lowered over time to avoid any side effects. You may get drowsy or  dizzy. Do not drive, use machinery, or do anything that needs mental alertness until you know how this medicine affects you. Do not stand or sit up quickly, especially if you are an older patient. This reduces the risk of dizzy or fainting spells. Alcohol can increase or decrease the effects of this medicine. Avoid alcoholic drinks. You may have constipation. Try to have a bowel movement at least every 2 to 3 days. If you do not have a bowel movement for 3 days, call your doctor or health care professional. Your mouth may get dry. Chewing sugarless gum or sucking hard candy, and drinking plenty of water may help. Contact your doctor if the problem does not go away or is severe. What side effects may I notice from receiving this medicine? Side effects that you should report to your doctor or health care professional as soon as  possible: -allergic reactions like skin rash, itching or hives, swelling of the face, lips, or tongue -breathing difficulties, wheezing -confusion -itching -light headedness or fainting spells -redness, blistering, peeling or loosening of the skin, including inside the mouth -seizures Side effects that usually do not require medical attention (report to your doctor or health care professional if they continue or are bothersome): -constipation -dizziness -drowsiness -headache -nausea, vomiting This list may not describe all possible side effects. Call your doctor for medical advice about side effects. You may report side effects to FDA at 1-800-FDA-1088. Where should I keep my medicine? Keep out of the reach of children. Store at room temperature between 15 and 30 degrees C (59 and 86 degrees F). Keep container tightly closed. Throw away any unused medicine after the expiration date. NOTE: This sheet is a summary. It may not cover all possible information. If you have questions about this medicine, talk to your doctor, pharmacist, or health care provider.  2015, [employer]. (2010-03-23 11:55:44)

## 2015-03-16 NOTE — ED Provider Notes (Signed)
CSN: [id number]     Arrival date & time 03/16/15  1641 History   First MD Initiated Contact with Patient 03/16/15 1648     Chief Complaint  Patient presents with  . Chest Pain     (Consider location/radiation/quality/duration/timing/severity/associated sxs/prior Treatment) Patient is a 29 y.o. female presenting with chest pain. The history is provided by the patient.  Chest Pain She had onset of left-sided chest pain and mid back pain and about 10 PM last night. She took 3 naproxen tablets without any relief. Pain is described as a burning sensation, and is worse when she lays down, and is better when she sits up. It is not affected by deep breathing, twisting, walking. She states she was unable to sleep because of the pain. There is no associated dyspnea or diaphoresis. There was mild nausea. She called EMS who gave her aspirin and nitroglycerin and pain reduced from 10/10-8/10. She does relate that she has started exercising recently. She does smoke one quarter pack of cigarettes a day. There is no history of diabetes, hypertension, hyperlipidemia. She does not have a history of coronary artery disease in first-degree relatives but has had second-degree relatives with coronary disease with onset in their 57s.  Past Medical History  Diagnosis Date  . Acid reflux   . Depression   . Anxiety   . Impacted tooth 09/2013    impacted wisdom teeth  . Leg pain, right     unknown cause, is being evaluated  . Diabetes mellitus without complication     borderline  . Obesity   . Dysmenorrhea 03/31/2014  . Hypertension   . Vaginal discharge 07/06/2014  . BV (bacterial vaginosis) 07/06/2014  . UTI (lower urinary tract infection) 09/03/2014  . Irregular periods 09/03/2014  . Yeast infection 09/21/2014   Past Surgical History  Procedure Laterality Date  . No past surgeries    . Tooth extraction N/A 10/06/2013    Procedure: EXTRACTION THIRD MOLARS;  Surgeon: Georgia Lopes, DDS;  Location: [facility];  Service: Oral Surgery;  Laterality: N/A;   Family History  Problem Relation Age of Onset  . Diabetes Mother   . Hypertension Mother   . Diabetes Father   . Preterm labor Son     preterm delivery at 35 weeks  . Kidney disease Maternal Grandmother     was on dialysis  . Heart disease Maternal Grandmother   . Heart attack Maternal Grandfather   . Heart attack Paternal Grandfather   . Cancer Maternal Aunt   . Cancer Paternal Aunt    Social History  Substance Use Topics  . Smoking status: Current Every Day Smoker -- 0.03 packs/day for 5 years    Types: Cigarettes  . Smokeless tobacco: Never Used     Comment: 3 cig./day  . Alcohol Use: No   OB History    Gravida Para Term Preterm AB TAB SAB Ectopic Multiple Living       Review of Systems  Cardiovascular: Positive for chest pain.  All other systems reviewed and are negative.     Allergies  Review of patient's allergies indicates no known allergies.  Home Medications   Prior to Admission medications   Medication Sig Start Date End Date Taking? Authorizing Provider  busPIRone (BUSPAR) 15 MG tablet 15 mg 3 (three) times daily.  06/15/14   Historical Provider, MD  fluconazole (DIFLUCAN) 150 MG tablet Take 1 tablet (150 mg  total) by mouth once. 09/21/14   Adline Potter, NP  ibuprofen (ADVIL,MOTRIN) 800 MG tablet Take 1 tablet (800 mg total) by mouth every 8 (eight) hours as needed for mild pain. 03/09/14   Kristen N Ward, DO  naproxen sodium (ANAPROX) 550 MG tablet Take 1  2-3 times daily prn for periods cramps 11/02/14   Adline Potter, NP  PARoxetine (PAXIL) 40 MG tablet 40 mg daily.  03/27/14   Historical Provider, MD  promethazine (PHENERGAN) 25 MG tablet Take 1 tablet (25 mg total) by mouth every 6 (six) hours as needed for nausea or vomiting. 09/03/14   Adline Potter, NP  terconazole (TERAZOL 7) 0.4 % vaginal cream Place 1 applicator vaginally at bedtime. 09/21/14   Adline Potter, NP  traMADol-acetaminophen (ULTRACET) 37.5-325 MG per tablet 2 tablets every 6 (six) hours as needed.  08/26/14   Historical Provider, MD   BP 101/50 mmHg  Pulse 86  Temp(Src) 98.1 F (36.7 C) (Oral)  Resp 18  Ht 5\' 3"  (1.6 m)  Wt 360 lb (163.295 kg)  BMI 63.79 kg/m2  SpO2 98%  LMP 02/14/2015 Physical Exam  Nursing note and vitals reviewed.  Morbidly obese 28 year old female, resting comfortably and in no acute distress. Vital signs are normal. Oxygen saturation is 98%, which is normal. Head is normocephalic and atraumatic. PERRLA, EOMI. Oropharynx is clear. Neck is nontender and supple without adenopathy or JVD. Back is mildly tender in the lower thoracic and upper lumbar spine. There is no CVA tenderness. Straight leg raise is negative. Lungs are clear without rales, wheezes, or rhonchi. Chest is moderately tender in the left anterior lower chest wall, and this does reproduce her pain. Heart has regular rate and rhythm without murmur. Abdomen is soft, flat, nontender without masses or hepatosplenomegaly and peristalsis is normoactive. Extremities have no cyanosis or edema, full range of motion is present. Skin is warm and dry without rash. Neurologic: Mental status is normal, cranial nerves are intact, there are no motor or sensory deficits.  ED Course  Procedures (including critical care time) Labs Review Results for orders placed or performed during the hospital encounter of 03/16/15  CBC with Differential  Result Value Ref Range   WBC 5.7 4.0 - 10.5 K/uL   RBC 4.19 3.87 - 5.11 MIL/uL   Hemoglobin 13.4 12.0 - 15.0 g/dL   HCT 65.7 84.6 - 96.2 %   MCV 92.6 78.0 - 100.0 fL   MCH 32.0 26.0 - 34.0 pg   MCHC 34.5 30.0 - 36.0 g/dL   RDW 95.2 84.1 - 32.4 %   Platelets 200 150 - 400 K/uL   Neutrophils Relative % 43 43 - 77 %   Neutro Abs 2.5 1.7 - 7.7 K/uL   Lymphocytes Relative 44 12 - 46 %   Lymphs Abs 2.4 0.7 - 4.0 K/uL   Monocytes Relative 5 3 - 12 %   Monocytes  Absolute 0.3 0.1 - 1.0 K/uL   Eosinophils Relative 8 (H) 0 - 5 %   Eosinophils Absolute 0.5 0.0 - 0.7 K/uL   Basophils Relative 0 0 - 1 %   Basophils Absolute 0.0 0.0 - 0.1 K/uL  Troponin I  Result Value Ref Range   Troponin I <0.03 <0.031 ng/mL  Basic metabolic panel  Result Value Ref Range   Sodium 137 135 - 145 mmol/L   Potassium 3.7 3.5 - 5.1 mmol/L   Chloride 107 101 - 111 mmol/L   CO2 24  22 - 32 mmol/L   Glucose, Bld 105 (H) 65 - 99 mg/dL   BUN 6 6 - 20 mg/dL   Creatinine, Ser 1.61 0.44 - 1.00 mg/dL   Calcium 9.1 8.9 - 09.6 mg/dL   GFR calc non Af Amer >60 >60 mL/min   GFR calc Af Amer >60 >60 mL/min   Anion gap 6 5 - 15   Imaging Review Dg Chest 2 View  03/16/2015   CLINICAL DATA:  Central chest pain with radiation to the upper back.  EXAM: CHEST  2 VIEW  COMPARISON:  12/27/2008  FINDINGS: Subtle densities in the medial right lower lung are nonspecific. Otherwise, the lungs are clear. Heart and mediastinum are within normal limits. The trachea is midline. No large pleural effusions.  IMPRESSION: Question subtle densities in the medial right lower lung. This could represent a small focus of infection.   Electronically Signed   By: Richarda Overlie M.D.   On: 03/16/2015 17:26   I have personally reviewed and evaluated these images and lab results as part of my medical decision-making.   EKG Interpretation   Date/Time:  Tuesday March 16 2015 16:45:21 EDT Ventricular Rate:  89 PR Interval:  168 QRS Duration: 87 QT Interval:  364 QTC Calculation: 443 R Axis:   92 Text Interpretation:  Sinus rhythm Borderline right axis deviation No old  tracing to compare Confirmed by [facility]  MD, Cristian Grieves ([id number]) on 03/16/2015  4:49:58 PM      MDM   Final diagnoses:  Atypical chest pain  GERD without esophagitis    Chest pain with chest wall tenderness which might suggest chest wall pain, but with burning pain which is worse with supine which is suggestive of gastroesophageal reflux  disease. ECG is unremarkable and troponin is normal. She was given a GI cocktail with temporary improvement in pain. This is consistent with gastroesophageal reflux. She is given initial dose of pantoprazole and is discharged with prescription for esomeprazole. She is concerned about pain and being able to sleep, so she is given a prescription for a small number of tramadol to use at night so that she can sleep. She is referred back to her PCP for follow-up in one week.    Dione Booze, MD 03/16/15 [phone]

## 2015-03-24 DIAGNOSIS — F329 Major depressive disorder, single episode, unspecified: Secondary | ICD-10-CM | POA: Diagnosis not present

## 2015-04-01 ENCOUNTER — Encounter: Payer: Self-pay | Admitting: Adult Health

## 2015-04-01 ENCOUNTER — Ambulatory Visit (INDEPENDENT_AMBULATORY_CARE_PROVIDER_SITE_OTHER): Payer: Medicare Other | Admitting: Adult Health

## 2015-04-01 VITALS — BP 120/80 | HR 72 | Ht 63.0 in | Wt 258.0 lb

## 2015-04-01 DIAGNOSIS — N898 Other specified noninflammatory disorders of vagina: Secondary | ICD-10-CM

## 2015-04-01 DIAGNOSIS — Z3202 Encounter for pregnancy test, result negative: Secondary | ICD-10-CM

## 2015-04-01 DIAGNOSIS — B379 Candidiasis, unspecified: Secondary | ICD-10-CM

## 2015-04-01 DIAGNOSIS — K219 Gastro-esophageal reflux disease without esophagitis: Secondary | ICD-10-CM

## 2015-04-01 DIAGNOSIS — N946 Dysmenorrhea, unspecified: Secondary | ICD-10-CM | POA: Diagnosis not present

## 2015-04-01 LAB — POCT WET PREP (WET MOUNT): WBC WET PREP: POSITIVE

## 2015-04-01 LAB — POCT URINE PREGNANCY: Preg Test, Ur: NEGATIVE

## 2015-04-01 MED ORDER — FLUCONAZOLE 150 MG PO TABS: 150.0000 mg | ORAL_TABLET | Freq: Once | ORAL | Status: DC

## 2015-04-01 MED ORDER — OMEPRAZOLE 20 MG PO CPDR: 20.0000 mg | DELAYED_RELEASE_CAPSULE | Freq: Every day | ORAL | Status: DC

## 2015-04-01 MED ORDER — NAPROXEN SODIUM 550 MG PO TABS: ORAL_TABLET | ORAL | Status: DC

## 2015-04-01 NOTE — Progress Notes (Signed)
Subjective:     Patient ID: Pamela Ford, female   DOB: 12/02/1985, 29 y.o.   MRN: [id number]  HPI Pamela Ford is a 29 year old black female in complaining of bad period cramps, last period lasted 5 days and had clots.She says she started at age 29 and periods have always been painful.She also complains of acid reflux,used to take Prilosec.Has decreased appetite and some nausea and vomiting at time of period.Has some itching in vagina at times.She says pain can be a 10 at times and anaprox helps some,has history of bicornuate uterus.  Review of Systems Patient denies any headaches, hearing loss, fatigue, blurred vision, shortness of breath, chest pain,  problems with bowel movements, urination, or intercourse. No joint pain or mood swings.See HPI for positives. Reviewed past medical,surgical, social and family history. Reviewed medications and allergies.     Objective:   Physical Exam BP 120/80 mmHg  Pulse 72  Ht  (1.6 m)  Wt 258 lb (117.028 kg)  BMI 45.71 kg/m2  LMP 03/17/2015 UPT negative, Skin warm and dry.Pelvic: external genitalia is normal in appearance no lesions, vagina: white discharge without odor,urethra has no lesions or masses noted, cervix is everted and bulbous, uterus: normal size, shape and contour, non tender, no masses felt, adnexa: no masses or tenderness noted. Bladder is non tender and no masses felt. Wet prep: + for yeast and +WBCs. Discussed trying to stop sodas and eat better.Will get [place] to assess uterus.    Assessment:      Dysmenorrhea Vaginal discharge Yeast GERD    Plan:     Rx diflucan 150 mg #1 take 1 now with 1 refill Refilled anaprox ds #30 take 1 2-3 x daily prn cramps with 3 refills Rx Prilosec 20 mg #30 take 1 daily with 11 refills Return in 1 week for gyn [place], will talk when results back Review handout omn GERD and dysmenorrhea Try to stop sodas to aide in weight loss

## 2015-04-01 NOTE — Patient Instructions (Addendum)
Take diflucan Take prilosec Return in  1 week for [place] Dysmenorrhea Menstrual cramps (dysmenorrhea) are caused by the muscles of the uterus tightening (contracting) during a menstrual period. For some women, this discomfort is merely bothersome. For others, dysmenorrhea can be severe enough to interfere with everyday activities for a few days each month. Primary dysmenorrhea is menstrual cramps that last a couple of days when you start having menstrual periods or soon after. This often begins after a teenager starts having her period. As a woman gets older or has a baby, the cramps will usually lessen or disappear. Secondary dysmenorrhea begins later in life, lasts longer, and the pain may be stronger than primary dysmenorrhea. The pain may start before the period and last a few days after the period.  CAUSES  Dysmenorrhea is usually caused by an underlying problem, such as:  The tissue lining the uterus grows outside of the uterus in other areas of the body (endometriosis).  The endometrial tissue, which normally lines the uterus, is found in or grows into the muscular walls of the uterus (adenomyosis).  The pelvic blood vessels are engorged with blood just before the menstrual period (pelvic congestive syndrome).  Overgrowth of cells (polyps) in the lining of the uterus or cervix.  Falling down of the uterus (prolapse) because of loose or stretched ligaments.  Depression.  Bladder problems, infection, or inflammation.  Problems with the intestine, a tumor, or irritable bowel syndrome.  Cancer of the female organs or bladder.  A severely tipped uterus.  A very tight opening or closed cervix.  Noncancerous tumors of the uterus (fibroids).  Pelvic inflammatory disease (PID).  Pelvic scarring (adhesions) from a previous surgery.  Ovarian cyst.  An intrauterine device (IUD) used for birth control. RISK FACTORS You may be at greater risk of dysmenorrhea if:  You are younger than  age 50.  You started puberty early.  You have irregular or heavy bleeding.  You have never given birth.  You have a family history of this problem.  You are a smoker. SIGNS AND SYMPTOMS   Cramping or throbbing pain in your lower abdomen.  Headaches.  Lower back pain.  Nausea or vomiting.  Diarrhea.  Sweating or dizziness.  Loose stools. DIAGNOSIS  A diagnosis is based on your history, symptoms, physical exam, diagnostic tests, or procedures. Diagnostic tests or procedures may include:  Blood tests.  Ultrasonography.  An examination of the lining of the uterus (dilation and curettage, D&C).  An examination inside your abdomen or pelvis with a scope (laparoscopy).  X-rays.  CT scan.  MRI.  An examination inside the bladder with a scope (cystoscopy).  An examination inside the intestine or stomach with a scope (colonoscopy, gastroscopy). TREATMENT  Treatment depends on the cause of the dysmenorrhea. Treatment may include:  Pain medicine prescribed by your health care provider.  Birth control pills or an IUD with progesterone hormone in it.  Hormone replacement therapy.  Nonsteroidal anti-inflammatory drugs (NSAIDs). These may help stop the production of prostaglandins.  Surgery to remove adhesions, endometriosis, ovarian cyst, or fibroids.  Removal of the uterus (hysterectomy).  Progesterone shots to stop the menstrual period.  Cutting the nerves on the sacrum that go to the female organs (presacral neurectomy).  Electric current to the sacral nerves (sacral nerve stimulation).  Antidepressant medicine.  Psychiatric therapy, counseling, or group therapy.  Exercise and physical therapy.  Meditation and yoga therapy.  Acupuncture. HOME CARE INSTRUCTIONS   Only take over-the-counter or prescription medicines as  directed by your health care provider.  Place a heating pad or hot water bottle on your lower back or abdomen. Do not sleep with the  heating pad.  Use aerobic exercises, walking, swimming, biking, and other exercises to help lessen the cramping.  Massage to the lower back or abdomen may help.  Stop smoking.  Avoid alcohol and caffeine. SEEK MEDICAL CARE IF:   Your pain does not get better with medicine.  You have pain with sexual intercourse.  Your pain increases and is not controlled with medicines.  You have abnormal vaginal bleeding with your period.  You develop nausea or vomiting with your period that is not controlled with medicine. SEEK IMMEDIATE MEDICAL CARE IF:  You pass out.  Document Released: 07/10/2005 Document Revised: 03/12/2013 Document Reviewed: 12/26/2012 [facility] Patient Information 2015 [place], [place]. This information is not intended to replace advice given to you by your health care provider. Make sure you discuss any questions you have with your health care provider. Gastroesophageal Reflux Disease, Adult Gastroesophageal reflux disease (GERD) happens when acid from your stomach flows up into the esophagus. When acid comes in contact with the esophagus, the acid causes soreness (inflammation) in the esophagus. Over time, GERD may create small holes (ulcers) in the lining of the esophagus. CAUSES   Increased body weight. This puts pressure on the stomach, making acid rise from the stomach into the esophagus.  Smoking. This increases acid production in the stomach.  Drinking alcohol. This causes decreased pressure in the lower esophageal sphincter (valve or ring of muscle between the esophagus and stomach), allowing acid from the stomach into the esophagus.  Late evening meals and a full stomach. This increases pressure and acid production in the stomach.  A malformed lower esophageal sphincter. Sometimes, no cause is found. SYMPTOMS   Burning pain in the lower part of the mid-chest behind the breastbone and in the mid-stomach area. This may occur twice a week or more often.  Trouble  swallowing.  Sore throat.  Dry cough.  Asthma-like symptoms including chest tightness, shortness of breath, or wheezing. DIAGNOSIS  Your caregiver may be able to diagnose GERD based on your symptoms. In some cases, X-rays and other tests may be done to check for complications or to check the condition of your stomach and esophagus. TREATMENT  Your caregiver may recommend over-the-counter or prescription medicines to help decrease acid production. Ask your caregiver before starting or adding any new medicines.  HOME CARE INSTRUCTIONS   Change the factors that you can control. Ask your caregiver for guidance concerning weight loss, quitting smoking, and alcohol consumption.  Avoid foods and drinks that make your symptoms worse, such as:  Caffeine or alcoholic drinks.  Chocolate.  Peppermint or mint flavorings.  Garlic and onions.  Spicy foods.  Citrus fruits, such as oranges, lemons, or limes.  Tomato-based foods such as sauce, chili, salsa, and pizza.  Fried and fatty foods.  Avoid lying down for the 3 hours prior to your bedtime or prior to taking a nap.  Eat small, frequent meals instead of large meals.  Wear loose-fitting clothing. Do not wear anything tight around your waist that causes pressure on your stomach.  Raise the head of your bed 6 to 8 inches with wood blocks to help you sleep. Extra pillows will not help.  Only take over-the-counter or prescription medicines for pain, discomfort, or fever as directed by your caregiver.  Do not take aspirin, ibuprofen, or other nonsteroidal anti-inflammatory drugs (NSAIDs). SEEK  IMMEDIATE MEDICAL CARE IF:   You have pain in your arms, neck, jaw, teeth, or back.  Your pain increases or changes in intensity or duration.  You develop nausea, vomiting, or sweating (diaphoresis).  You develop shortness of breath, or you faint.  Your vomit is green, yellow, black, or looks like coffee grounds or blood.  Your stool is  red, bloody, or black. These symptoms could be signs of other problems, such as heart disease, gastric bleeding, or esophageal bleeding. MAKE SURE YOU:   Understand these instructions.  Will watch your condition.  Will get help right away if you are not doing well or get worse. Document Released: 04/19/2005 Document Revised: 10/02/2011 Document Reviewed: 01/27/2011 [facility] Patient Information 2015 [place], [place]. This information is not intended to replace advice given to you by your health care provider. Make sure you discuss any questions you have with your health care provider.

## 2015-04-02 ENCOUNTER — Telehealth: Payer: Self-pay | Admitting: Adult Health

## 2015-04-02 NOTE — Telephone Encounter (Signed)
Left message I called 

## 2015-04-05 ENCOUNTER — Ambulatory Visit (INDEPENDENT_AMBULATORY_CARE_PROVIDER_SITE_OTHER): Payer: Medicare Other

## 2015-04-05 DIAGNOSIS — N946 Dysmenorrhea, unspecified: Secondary | ICD-10-CM | POA: Diagnosis not present

## 2015-04-05 NOTE — Progress Notes (Signed)
PELVIC US TA/TV : anteverted bicornuate uterus wnl,EEC right 13.29mm,left EEC 1.2cm,normal ov's bilat,small amount of fluid around both ov's

## 2015-04-07 ENCOUNTER — Telehealth: Payer: Self-pay | Admitting: *Deleted

## 2015-04-07 NOTE — Telephone Encounter (Signed)
Spoke with pt. Pt is wanting to know the results of her [place]. Thanks!! JSY

## 2015-04-07 NOTE — Telephone Encounter (Signed)
Left message to call.

## 2015-04-08 ENCOUNTER — Telehealth: Payer: Self-pay | Admitting: Adult Health

## 2015-04-08 ENCOUNTER — Other Ambulatory Visit: Payer: Self-pay | Admitting: Adult Health

## 2015-04-08 MED ORDER — NORETHIN ACE-ETH ESTRAD-FE 1-20 MG-MCG(24) PO TABS: 1.0000 | ORAL_TABLET | Freq: Every day | ORAL | Status: DC

## 2015-04-08 MED ORDER — NORETHIN-ETH ESTRAD-FE BIPHAS 1 MG-10 MCG / 10 MCG PO TABS: 1.0000 | ORAL_TABLET | Freq: Every day | ORAL | Status: DC

## 2015-04-08 NOTE — Telephone Encounter (Signed)
Pt aware of [place] and discussed trying OCs to help with periods for now and then when she wants to get pregnant, will call.Will rx lo loestrin 1 daily start with next period

## 2015-04-08 NOTE — Telephone Encounter (Signed)
Left message to call back  

## 2015-04-12 ENCOUNTER — Other Ambulatory Visit: Payer: Self-pay | Admitting: Adult Health

## 2015-04-12 MED ORDER — NORGESTIM-ETH ESTRAD TRIPHASIC 0.18/0.215/0.25 MG-35 MCG PO TABS: 1.0000 | ORAL_TABLET | Freq: Every day | ORAL | Status: DC

## 2015-05-11 DIAGNOSIS — F339 Major depressive disorder, recurrent, unspecified: Secondary | ICD-10-CM | POA: Diagnosis not present

## 2015-05-11 DIAGNOSIS — Z79899 Other long term (current) drug therapy: Secondary | ICD-10-CM | POA: Diagnosis not present

## 2015-05-11 DIAGNOSIS — F17200 Nicotine dependence, unspecified, uncomplicated: Secondary | ICD-10-CM | POA: Diagnosis not present

## 2015-05-11 DIAGNOSIS — R739 Hyperglycemia, unspecified: Secondary | ICD-10-CM | POA: Diagnosis not present

## 2015-05-11 DIAGNOSIS — Z0000 Encounter for general adult medical examination without abnormal findings: Secondary | ICD-10-CM | POA: Diagnosis not present

## 2015-05-11 DIAGNOSIS — K219 Gastro-esophageal reflux disease without esophagitis: Secondary | ICD-10-CM | POA: Diagnosis not present

## 2015-05-13 ENCOUNTER — Encounter: Payer: Self-pay | Admitting: Internal Medicine

## 2015-05-26 ENCOUNTER — Telehealth: Payer: Self-pay | Admitting: *Deleted

## 2015-05-26 NOTE — Telephone Encounter (Signed)
Spoke with pt. Pt has missed a period. She started 04/19/15 and has had no period for October. Pt has had 1 + UPT and 2 negative UPT. I advised she would need to come in for us to do a UPT and probably a blood pregnancy test as well. Pt voiced understanding and call was transferred to front desk for appt. JSY

## 2015-05-26 NOTE — Telephone Encounter (Signed)
Left message x 1. JSY 

## 2015-05-27 ENCOUNTER — Ambulatory Visit (INDEPENDENT_AMBULATORY_CARE_PROVIDER_SITE_OTHER): Payer: Medicare Other | Admitting: Obstetrics & Gynecology

## 2015-05-27 ENCOUNTER — Encounter: Payer: Self-pay | Admitting: Obstetrics & Gynecology

## 2015-05-27 VITALS — BP 120/70 | HR 74 | Ht 63.0 in | Wt 258.0 lb

## 2015-05-27 DIAGNOSIS — N912 Amenorrhea, unspecified: Secondary | ICD-10-CM

## 2015-05-27 DIAGNOSIS — N946 Dysmenorrhea, unspecified: Secondary | ICD-10-CM | POA: Diagnosis not present

## 2015-05-27 DIAGNOSIS — Z3202 Encounter for pregnancy test, result negative: Secondary | ICD-10-CM

## 2015-05-27 LAB — POCT URINE PREGNANCY: Preg Test, Ur: NEGATIVE

## 2015-05-27 MED ORDER — MEDROXYPROGESTERONE ACETATE 10 MG PO TABS: 10.0000 mg | ORAL_TABLET | Freq: Every day | ORAL | Status: DC

## 2015-05-27 MED ORDER — NORGESTIMATE-ETH ESTRADIOL 0.25-35 MG-MCG PO TABS: 1.0000 | ORAL_TABLET | Freq: Every day | ORAL | Status: DC

## 2015-05-27 NOTE — Progress Notes (Signed)
Patient ID: Pamela Ford, female   DOB: 10-20-1985, 29 y.o.   MRN: [id number] Chief Complaint  Patient presents with  . missed period in October    Blood pressure 120/70, pulse 74, height 5\' 3"  (1.6 m), weight 258 lb (117.028 kg), last menstrual period 04/19/2015.  29 y.o. [id number] [name] last menstrual period was 04/19/2015. The current method of family planning is none.  Subjective [name] last menstrual period was 04/19/2015. Has not started OCP yet UPT neg, had 1 +UPT at home, 2 negative so pt was confused  Objective   Pertinent ROS   Labs or studies UPT negative    Impression Diagnoses this Encounter::   ICD-9-CM ICD-10-CM   1. Amenorrhea 626.0 N91.2   2. Negative pregnancy test V72.41 Z32.02 POCT urine pregnancy  3. Dysmenorrhea 625.3 N94.6     Established relevant diagnosis(es):   Plan/Recommendations: Meds ordered this encounter  Medications  . medroxyPROGESTERone (PROVERA) 10 MG tablet    Sig: Take 1 tablet (10 mg total) by mouth daily.    Dispense:  10 tablet    Refill:  0  . norgestimate-ethinyl estradiol (ORTHO-CYCLEN,SPRINTEC,PREVIFEM) 0.25-35 MG-MCG tablet    Sig: Take 1 tablet by mouth daily.    Dispense:  1 Package    Refill:  11    Labs or Scans Ordered: Orders Placed This Encounter  Procedures  . POCT urine pregnancy      Follow up Return if symptoms worsen or fail to improve.   Take the provera for 10 days then take the OCP for the dysmenorrhea   Face to face time:  15 minutes  Greater than 50% of the visit time was spent in counseling and coordination of care with the patient.  The summary and outline of the counseling and care coordination is summarized in the note above.   All questions were answered.

## 2015-06-03 ENCOUNTER — Ambulatory Visit (INDEPENDENT_AMBULATORY_CARE_PROVIDER_SITE_OTHER): Payer: Medicare Other | Admitting: Gastroenterology

## 2015-06-03 ENCOUNTER — Other Ambulatory Visit: Payer: Self-pay

## 2015-06-03 ENCOUNTER — Encounter: Payer: Self-pay | Admitting: Gastroenterology

## 2015-06-03 VITALS — BP 144/89 | HR 86 | Temp 97.6°F | Ht 64.0 in | Wt 257.6 lb

## 2015-06-03 DIAGNOSIS — K219 Gastro-esophageal reflux disease without esophagitis: Secondary | ICD-10-CM

## 2015-06-03 MED ORDER — PANTOPRAZOLE SODIUM 40 MG PO TBEC: 40.0000 mg | DELAYED_RELEASE_TABLET | Freq: Every day | ORAL | Status: DC

## 2015-06-03 NOTE — Progress Notes (Signed)
    Primary Care Physician: FANTA,TESFAYE, MD  Primary Gastroenterologist:  Michael Rourk, MD   Chief Complaint  Patient presents with  . Gastroesophageal Reflux    HPI: Pamela Ford is a 29 y.o. female here at the request of Dr. Fantle for further evaluation of GERD. Patient complains of typical heartburn symptoms unresponsive to Nexium, omeprazole, Prevacid. Nexium causes diarrhea. Lots of "sour burps". Nausea without vomiting. Symptoms worse over the past several months. Denies dysphagia. She feels bloated a lot. Wakes up at 4 AM every morning for BM, multiple loose stools. Usually no further stools after 7 AM each morning. This is been on for years. No blood in the stool or melena. Recently did well on Protonix. No prior EGD.  Labs unremarkable as below. Rarely takes naproxen. Recently started BuSpar and Paxil.  Current Outpatient Prescriptions  Medication Sig Dispense Refill  . busPIRone (BUSPAR) 15 MG tablet Take 15 mg by mouth 3 (three) times daily.     . naproxen sodium (ANAPROX) 550 MG tablet Take 1  2-3 times daily prn for periods cramps 30 tablet 3  . norgestimate-ethinyl estradiol (ORTHO-CYCLEN,SPRINTEC,PREVIFEM) 0.25-35 MG-MCG tablet Take 1 tablet by mouth daily. 1 Package 11  . PARoxetine (PAXIL) 40 MG tablet Take 40 mg by mouth daily.     . omeprazole 20mg daily      No current facility-administered medications for this visit.    Allergies as of 06/03/2015  . (No Known Allergies)   Past Medical History  Diagnosis Date  . Acid reflux   . Depression   . Anxiety   . Impacted tooth 09/2013    impacted wisdom teeth  . Leg pain, right     unknown cause, is being evaluated  . 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 Obesity   . Dysmenorrhea 03/31/2014  . Hypertension   . Vaginal discharge 07/06/2014  . BV (bacterial vaginosis) 07/06/2014  . UTI (lower urinary tract infection) 09/03/2014  . Irregular periods 09/03/2014  . Yeast infection  09/21/2014   Past Surgical History  Procedure Laterality Date  . No past surgeries    . Tooth extraction N/A 10/06/2013    Procedure: EXTRACTION THIRD MOLARS;  Surgeon: Scott M Jensen, DDS;  Location: [facility];  Service: Oral Surgery;  Laterality: N/A;   Family History  Problem Relation Age of Onset  . Diabetes Mother   . Hypertension Mother   . Diabetes Father   . Preterm labor Son     preterm delivery at 35 weeks  . Kidney disease Maternal Grandmother     was on dialysis  . Heart disease Maternal Grandmother   . Heart attack Maternal Grandfather   . Heart attack Paternal Grandfather   . Cancer Maternal Aunt   . Cancer Paternal Aunt   . Colon cancer Neg Hx    Social History   Social History  . Marital Status: Married    Spouse Name: N/A  . Number of Children: N/A  . Years of Education: N/A   Social History Main Topics  . Smoking status: Current Every Day Smoker -- 0.03 packs/day for 5 years    Types: Cigarettes  . Smokeless tobacco: Never Used     Comment: 3 cig./day  . Alcohol Use: No  . Drug Use: No  . Sexual Activity: Yes    Birth Control/ Protection: None   Other Topics Concern  . None   Social History Narrative      ROS:  General: Negative for anorexia, weight loss, fever, chills, fatigue, weakness. ENT: Negative for hoarseness, difficulty swallowing , nasal congestion. CV: Negative for chest pain, angina, palpitations, dyspnea on exertion, peripheral edema.  Respiratory: Negative for dyspnea at rest, dyspnea on exertion, cough, sputum, wheezing.  GI: See history of present illness. GU:  Negative for dysuria, hematuria, urinary incontinence, urinary frequency, nocturnal urination.  Endo: Negative for unusual weight change.    Physical Examination:   BP 144/89 mmHg  Pulse 86  Temp(Src) 97.6 F (36.4 C)  Ht 5\' 4"  (1.626 m)  Wt 257 lb 9.6 oz (116.847 kg)  BMI 44.20 kg/m2  LMP 05/30/2015  General: Well-nourished, well-developed in no  acute distress.  Eyes: No icterus. Mouth: Oropharyngeal mucosa moist and pink , no lesions erythema or exudate. Lungs: Clear to auscultation bilaterally.  Heart: Regular rate and rhythm, no murmurs rubs or gallops.  Abdomen: Bowel sounds are normal, nontender, nondistended, no hepatosplenomegaly or masses, no abdominal bruits or hernia , no rebound or guarding.   Extremities: No lower extremity edema. No clubbing or deformities. Neuro: Alert and oriented x 4   Skin: Warm and dry, no jaundice.   Psych: Alert and cooperative, normal mood and affect.  Labs:  October 2016 Glucose 106, BUN 7, creatinine 0.93, sodium 140, potassium 4.5, hemoglobin A1c 6, albumin 3.9, total bilirubin 0.3, alkaline phosphatase 74, AST 18, ALT 19, white blood cell count 5300, hemoglobin 13.2, hematocrit 40, platelets 231,000.  Imaging Studies: No results found.

## 2015-06-03 NOTE — Patient Instructions (Signed)
1. Upper endoscopy as scheduled. See separate instructions. 2. Prescription for pantoprazole sent to [facility]. We will likely have to fill out prior authorization forms to get it approved where they may require to try another medication called Dexilant. We will keep you posted.

## 2015-06-04 NOTE — Assessment & Plan Note (Addendum)
29 year old female with refractory GERD, nausea. Has failed multiple PPIs, Nexium, omeprazole, Prevacid. No prior upper endoscopy. Recommend upper endoscopy for further evaluation. Augment conscious sedation with Phenergan 25 mg 30 minutes before the procedure. I have discussed the risks, alternatives, benefits with regards to but not limited to the risk of reaction to medication, bleeding, infection, perforation and the patient is agreeable to proceed. Written consent to be obtained.  Switch from omeprazole to pantoprazole 40 mg daily as this seemed to work at her for her in the past. Patient is significantly overweight. Discussed slow gradual weight loss. Discussed antireflux measures.

## 2015-06-04 NOTE — Progress Notes (Signed)
CC'ED TO PCP 

## 2015-06-08 ENCOUNTER — Telehealth: Payer: Self-pay

## 2015-06-08 NOTE — Telephone Encounter (Signed)
Pt is calling to see if there is something she can take for her reflux while she waits on the PA for the Protonix. Please advise

## 2015-06-09 NOTE — Telephone Encounter (Signed)
Pt will come by in the morning

## 2015-06-09 NOTE — Telephone Encounter (Signed)
We can give her some Dexilant samples, #10.

## 2015-06-23 ENCOUNTER — Ambulatory Visit (HOSPITAL_COMMUNITY)
Admission: RE | Admit: 2015-06-23 | Discharge: 2015-06-23 | Disposition: A | Discharge status: Home / Self Care | Payer: Medicare Other | Source: Ambulatory Visit | Attending: Internal Medicine | Admitting: Internal Medicine

## 2015-06-23 ENCOUNTER — Encounter (HOSPITAL_COMMUNITY)
Admission: RE | Disposition: A | Discharge status: Home / Self Care | Payer: Self-pay | Source: Ambulatory Visit | Attending: Internal Medicine

## 2015-06-23 ENCOUNTER — Encounter (HOSPITAL_COMMUNITY): Payer: Self-pay

## 2015-06-23 DIAGNOSIS — E119 Type 2 diabetes mellitus without complications: Secondary | ICD-10-CM | POA: Insufficient documentation

## 2015-06-23 DIAGNOSIS — F17210 Nicotine dependence, cigarettes, uncomplicated: Secondary | ICD-10-CM | POA: Diagnosis not present

## 2015-06-23 DIAGNOSIS — F418 Other specified anxiety disorders: Secondary | ICD-10-CM | POA: Insufficient documentation

## 2015-06-23 DIAGNOSIS — K219 Gastro-esophageal reflux disease without esophagitis: Secondary | ICD-10-CM | POA: Insufficient documentation

## 2015-06-23 DIAGNOSIS — E669 Obesity, unspecified: Secondary | ICD-10-CM | POA: Insufficient documentation

## 2015-06-23 DIAGNOSIS — Z79899 Other long term (current) drug therapy: Secondary | ICD-10-CM | POA: Diagnosis not present

## 2015-06-23 DIAGNOSIS — Z6841 Body Mass Index (BMI) 40.0 and over, adult: Secondary | ICD-10-CM | POA: Insufficient documentation

## 2015-06-23 DIAGNOSIS — R110 Nausea: Secondary | ICD-10-CM | POA: Diagnosis not present

## 2015-06-23 HISTORY — PX: ESOPHAGOGASTRODUODENOSCOPY: SHX5428

## 2015-06-23 SURGERY — EGD (ESOPHAGOGASTRODUODENOSCOPY)
Anesthesia: Moderate Sedation

## 2015-06-23 MED ORDER — PROMETHAZINE HCL 25 MG/ML IJ SOLN
25.0000 mg | Freq: Once | INTRAMUSCULAR | Status: AC
  Administered 2015-06-23: 25 mg via INTRAVENOUS

## 2015-06-23 MED ORDER — PROMETHAZINE HCL 25 MG/ML IJ SOLN
INTRAMUSCULAR | Status: AC
  Filled 2015-06-23: qty 1

## 2015-06-23 MED ORDER — LIDOCAINE VISCOUS 2 % MT SOLN
OROMUCOSAL | Status: DC | PRN
  Administered 2015-06-23: 4 mL via OROMUCOSAL

## 2015-06-23 MED ORDER — MEPERIDINE HCL 100 MG/ML IJ SOLN
INTRAMUSCULAR | Status: AC
  Filled 2015-06-23: qty 2

## 2015-06-23 MED ORDER — ONDANSETRON HCL 4 MG/2ML IJ SOLN
INTRAMUSCULAR | Status: DC | PRN
  Administered 2015-06-23: 4 mg via INTRAVENOUS

## 2015-06-23 MED ORDER — STERILE WATER FOR IRRIGATION IR SOLN
Status: DC | PRN
  Administered 2015-06-23: 09:00:00

## 2015-06-23 MED ORDER — MIDAZOLAM HCL 5 MG/5ML IJ SOLN
INTRAMUSCULAR | Status: DC | PRN
  Administered 2015-06-23: 2 mg via INTRAVENOUS
  Administered 2015-06-23: 1 mg via INTRAVENOUS

## 2015-06-23 MED ORDER — LIDOCAINE VISCOUS 2 % MT SOLN
OROMUCOSAL | Status: AC
  Filled 2015-06-23: qty 15

## 2015-06-23 MED ORDER — SODIUM CHLORIDE 0.9 % IJ SOLN
INTRAMUSCULAR | Status: AC
  Filled 2015-06-23: qty 3

## 2015-06-23 MED ORDER — ONDANSETRON HCL 4 MG/2ML IJ SOLN
INTRAMUSCULAR | Status: AC
  Filled 2015-06-23: qty 2

## 2015-06-23 MED ORDER — MIDAZOLAM HCL 5 MG/5ML IJ SOLN
INTRAMUSCULAR | Status: AC
  Filled 2015-06-23: qty 10

## 2015-06-23 MED ORDER — SODIUM CHLORIDE 0.9 % IV SOLN
INTRAVENOUS | Status: DC
  Administered 2015-06-23: 08:00:00 via INTRAVENOUS

## 2015-06-23 MED ORDER — MEPERIDINE HCL 100 MG/ML IJ SOLN
INTRAMUSCULAR | Status: DC | PRN
  Administered 2015-06-23: 25 mg via INTRAVENOUS
  Administered 2015-06-23: 50 mg via INTRAVENOUS

## 2015-06-23 NOTE — H&P (View-Only) (Signed)
Primary Care Physician: Avon GullyFANTA,TESFAYE, MD  Primary Gastroenterologist:  Roetta SessionsMichael Rourk, MD   Chief Complaint  Patient presents with  . Gastroesophageal Reflux    HPI: Pamela Ford is a 29 y.o. female here at the request of Dr. Cristal GenerousFantle for further evaluation of GERD. Patient complains of typical heartburn symptoms unresponsive to Nexium, omeprazole, Prevacid. Nexium causes diarrhea. Lots of "sour burps". Nausea without vomiting. Symptoms worse over the past several months. Denies dysphagia. She feels bloated a lot. Wakes up at 4 AM every morning for BM, multiple loose stools. Usually no further stools after 7 AM each morning. This is been on for years. No blood in the stool or melena. Recently did well on Protonix. No prior EGD.  Labs unremarkable as below. Rarely takes naproxen. Recently started BuSpar and Paxil.  Current Outpatient Prescriptions  Medication Sig Dispense Refill  . busPIRone (BUSPAR) 15 MG tablet Take 15 mg by mouth 3 (three) times daily.     . naproxen sodium (ANAPROX) 550 MG tablet Take 1  2-3 times daily prn for periods cramps 30 tablet 3  . norgestimate-ethinyl estradiol (ORTHO-CYCLEN,SPRINTEC,PREVIFEM) 0.25-35 MG-MCG tablet Take 1 tablet by mouth daily. 1 Package 11  . PARoxetine (PAXIL) 40 MG tablet Take 40 mg by mouth daily.     Marland Kitchen. omeprazole 20mg  daily      No current facility-administered medications for this visit.    Allergies as of 06/03/2015  . (No Known Allergies)   Past Medical History  Diagnosis Date  . Acid reflux   . Depression   . Anxiety   . Impacted tooth 09/2013    impacted wisdom teeth  . Leg pain, right     unknown cause, is being evaluated  . 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 Obesity   . Dysmenorrhea 03/31/2014  . Hypertension   . Vaginal discharge 07/06/2014  . BV (bacterial vaginosis) 07/06/2014  . UTI (lower urinary tract infection) 09/03/2014  . Irregular periods 09/03/2014  . Yeast infection  09/21/2014   Past Surgical History  Procedure Laterality Date  . No past surgeries    . Tooth extraction N/A 10/06/2013    Procedure: EXTRACTION THIRD MOLARS;  Surgeon: Georgia LopesScott M Jensen, DDS;  Location: [facility];  Service: Oral Surgery;  Laterality: N/A;   Family History  Problem Relation Age of Onset  . Diabetes Mother   . Hypertension Mother   . Diabetes Father   . Preterm labor Son     preterm delivery at 35 weeks  . Kidney disease Maternal Grandmother     was on dialysis  . Heart disease Maternal Grandmother   . Heart attack Maternal Grandfather   . Heart attack Paternal Grandfather   . Cancer Maternal Aunt   . Cancer Paternal Aunt   . Colon cancer Neg Hx    Social History   Social History  . Marital Status: Married    Spouse Name: N/A  . Number of Children: N/A  . Years of Education: N/A   Social History Main Topics  . Smoking status: Current Every Day Smoker -- 0.03 packs/day for 5 years    Types: Cigarettes  . Smokeless tobacco: Never Used     Comment: 3 cig./day  . Alcohol Use: No  . Drug Use: No  . Sexual Activity: Yes    Birth Control/ Protection: None   Other Topics Concern  . None   Social History Narrative  ROS:  General: Negative for anorexia, weight loss, fever, chills, fatigue, weakness. ENT: Negative for hoarseness, difficulty swallowing , nasal congestion. CV: Negative for chest pain, angina, palpitations, dyspnea on exertion, peripheral edema.  Respiratory: Negative for dyspnea at rest, dyspnea on exertion, cough, sputum, wheezing.  GI: See history of present illness. GU:  Negative for dysuria, hematuria, urinary incontinence, urinary frequency, nocturnal urination.  Endo: Negative for unusual weight change.    Physical Examination:   BP 144/89 mmHg  Pulse 86  Temp(Src) 97.6 F (36.4 C)  Ht 5\' 4"  (1.626 m)  Wt 257 lb 9.6 oz (116.847 kg)  BMI 44.20 kg/m2  LMP 05/30/2015  General: Well-nourished, well-developed in no  acute distress.  Eyes: No icterus. Mouth: Oropharyngeal mucosa moist and pink , no lesions erythema or exudate. Lungs: Clear to auscultation bilaterally.  Heart: Regular rate and rhythm, no murmurs rubs or gallops.  Abdomen: Bowel sounds are normal, nontender, nondistended, no hepatosplenomegaly or masses, no abdominal bruits or hernia , no rebound or guarding.   Extremities: No lower extremity edema. No clubbing or deformities. Neuro: Alert and oriented x 4   Skin: Warm and dry, no jaundice.   Psych: Alert and cooperative, normal mood and affect.  Labs:  October 2016 Glucose 106, BUN 7, creatinine 0.93, sodium 140, potassium 4.5, hemoglobin A1c 6, albumin 3.9, total bilirubin 0.3, alkaline phosphatase 74, AST 18, ALT 19, white blood cell count 5300, hemoglobin 13.2, hematocrit 40, platelets 231,000.  Imaging Studies: No results found.

## 2015-06-23 NOTE — Op Note (Signed)
[facility] [address] [place], [id number]   ENDOSCOPY PROCEDURE REPORT  PATIENT: Pamela Ford, [name]  MR#: [id number] BIRTHDATE: [birth year] , 29  yrs. old GENDER: female ENDOSCOPIST: R.  Roetta SessionsMichael Johnica Armwood, MD FACP FACG REFERRED BY:  Glenice Laineesafaye Fanta, M.D. PROCEDURE DATE:  06/23/2015 PROCEDURE:  EGD, diagnostic INDICATIONS:  Refractory GERD. MEDICATIONS: Versed 4 mg IV and Demerol 75 mg IV in divided doses. Phenergan 25 mg IV.  Zofran 4 mg IV.  Xylocaine gel orally. ASA CLASS:      Class II  CONSENT: The risks, benefits, limitations, alternatives and imponderables have been discussed.  The potential for biopsy, esophogeal dilation, etc. have also been reviewed.  Questions have been answered.  All parties agreeable.  Please see the history and physical in the medical record for more information.  DESCRIPTION OF PROCEDURE: After the risks benefits and alternatives of the procedure were thoroughly explained, informed consent was obtained.  The EG-[id number] ([id number] endoscope was introduced through the mouth and advanced to the second portion of the duodenum , limited by Without limitations. The instrument was slowly withdrawn as the mucosa was fully examined. Estimated blood loss is zero unless otherwise noted in this procedure report.    Normal-appearing tubular esophagus per Stomach with some retained gastric contents which precluded complete examination of the gastric mucosa.  No hiatal hernia.  Much the gastric mucosa, however, was seen and appeared normal.  Patent pylorus. Normal-appearing first and second portion of the duodenum. Retroflexed views revealed as previously described.     The scope was then withdrawn from the patient and the procedure completed.  COMPLICATIONS: There were no immediate complications.  ENDOSCOPIC IMPRESSION: Retained gastric contents. Not all the stomach seen, however, upper GI tract that was seen appeared normal. Patient reports last  intake of solid food 14 hours prior to this procedure. Therefore, patient likely has an element of delayed gastric emptying. This could be medication effect and possibly a result of underlying diabetic gastroparesis. Intolerant to multiple PPIs. Most recently pantoprazole cause diarrhea but did help her reflux symptoms. She has not apparently tried rabeprazole previously.  RECOMMENDATIONS: 2 week course of rabeprazole 20 mg daily. Office visit in 3 months. Patient is let us know how she's doing with rabeprazole in 2 weeks.  REPEAT EXAM:  eSigned:  R. Roetta SessionsMichael Farooq Petrovich, MD FACP [facility] 06/23/2015 9:00 AM    CC:  CPT CODES: ICD CODES:  The ICD and CPT codes recommended by this software are interpretations from the data that the clinical staff has captured with the software.  The verification of the translation of this report to the ICD and CPT codes and modifiers is the sole responsibility of the health care institution and practicing physician where this report was generated.  [employer] will not be held responsible for the validity of the ICD and CPT codes included on this report.  AMA assumes no liability for data contained or not contained herein. CPT is a Publishing rights managerregistered trademark of the [employer].  PATIENT NAME:  Pamela Ford, Kerith S MR#: [id number]

## 2015-06-23 NOTE — Discharge Instructions (Signed)
EGD Discharge instructions Please read the instructions outlined below and refer to this sheet in the next few weeks. These discharge instructions provide you with general information on caring for yourself after you leave the hospital. Your doctor may also give you specific instructions. While your treatment has been planned according to the most current medical practices available, unavoidable complications occasionally occur. If you have any problems or questions after discharge, please call your doctor. ACTIVITY  You may resume your regular activity but move at a slower pace for the next 24 hours.   Take frequent rest periods for the next 24 hours.   Walking will help expel (get rid of) the air and reduce the bloated feeling in your abdomen.   No driving for 24 hours (because of the anesthesia (medicine) used during the test).   You may shower.   Do not sign any important legal documents or operate any machinery for 24 hours (because of the anesthesia used during the test).  NUTRITION  Drink plenty of fluids.   You may resume your normal diet.   Begin with a light meal and progress to your normal diet.   Avoid alcoholic beverages for 24 hours or as instructed by your caregiver.  MEDICATIONS  You may resume your normal medications unless your caregiver tells you otherwise.  WHAT YOU CAN EXPECT TODAY  You may experience abdominal discomfort such as a feeling of fullness or gas pains.  FOLLOW-UP  Your doctor will discuss the results of your test with you.  SEEK IMMEDIATE MEDICAL ATTENTION IF ANY OF THE FOLLOWING OCCUR:  Excessive nausea (feeling sick to your stomach) and/or vomiting.   Severe abdominal pain and distention (swelling).   Trouble swallowing.   Temperature over 101 F (37.8 C).  Rectal bleeding or vomiting of blood.     GERD information  Let's try rabeprazole (AcipHex). This has not been used previously. 20 mg once daily for GERD. 2 week supply. Let  us know how you're doing on this medication in 2 weeks.  Office visit in 3 months.  Tuesday, February 28 at 9:00AM appointment at Dr. Luvenia Starchourk's office  Gastroesophageal Reflux Disease, Adult Normally, food travels down the esophagus and stays in the stomach to be digested. However, when a person has gastroesophageal reflux disease (GERD), food and stomach acid move back up into the esophagus. When this happens, the esophagus becomes sore and inflamed. Over time, GERD can create small holes (ulcers) in the lining of the esophagus.  CAUSES This condition is caused by a problem with the muscle between the esophagus and the stomach (lower esophageal sphincter, or LES). Normally, the LES muscle closes after food passes through the esophagus to the stomach. When the LES is weakened or abnormal, it does not close properly, and that allows food and stomach acid to go back up into the esophagus. The LES can be weakened by certain dietary substances, medicines, and medical conditions, including:  Tobacco use.  Pregnancy.  Having a hiatal hernia.  Heavy alcohol use.  Certain foods and beverages, such as coffee, chocolate, onions, and peppermint. RISK FACTORS This condition is more likely to develop in:  People who have an increased body weight.  People who have connective tissue disorders.  People who use NSAID medicines. SYMPTOMS Symptoms of this condition include:  Heartburn.  Difficult or painful swallowing.  The feeling of having a lump in the throat.  Abitter taste in the mouth.  Bad breath.  Having a large amount of saliva.  Having an upset or bloated stomach.  Belching.  Chest pain.  Shortness of breath or wheezing.  Ongoing (chronic) cough or a night-time cough.  Wearing away of tooth enamel.  Weight loss. Different conditions can cause chest pain. Make sure to see your health care provider if you experience chest pain. DIAGNOSIS Your health care provider will take  a medical history and perform a physical exam. To determine if you have mild or severe GERD, your health care provider may also monitor how you respond to treatment. You may also have other tests, including:  An endoscopy toexamine your stomach and esophagus with a small camera.  A test thatmeasures the acidity level in your esophagus.  A test thatmeasures how much pressure is on your esophagus.  A barium swallow or modified barium swallow to show the shape, size, and functioning of your esophagus. TREATMENT The goal of treatment is to help relieve your symptoms and to prevent complications. Treatment for this condition may vary depending on how severe your symptoms are. Your health care provider may recommend:  Changes to your diet.  Medicine.  Surgery. HOME CARE INSTRUCTIONS Diet  Follow a diet as recommended by your health care provider. This may involve avoiding foods and drinks such as:  Coffee and tea (with or without caffeine).  Drinks that containalcohol.  Energy drinks and sports drinks.  Carbonated drinks or sodas.  Chocolate and cocoa.  Peppermint and mint flavorings.  Garlic and onions.  Horseradish.  Spicy and acidic foods, including peppers, chili powder, curry powder, vinegar, hot sauces, and barbecue sauce.  Citrus fruit juices and citrus fruits, such as oranges, lemons, and limes.  Tomato-based foods, such as red sauce, chili, salsa, and pizza with red sauce.  Fried and fatty foods, such as donuts, french fries, potato chips, and high-fat dressings.  High-fat meats, such as hot dogs and fatty cuts of red and white meats, such as rib eye steak, sausage, ham, and bacon.  High-fat dairy items, such as whole milk, butter, and cream cheese.  Eat small, frequent meals instead of large meals.  Avoid drinking large amounts of liquid with your meals.  Avoid eating meals during the 2-3 hours before bedtime.  Avoid lying down right after you  eat.  Do not exercise right after you eat. General Instructions  Pay attention to any changes in your symptoms.  Take over-the-counter and prescription medicines only as told by your health care provider. Do not take aspirin, ibuprofen, or other NSAIDs unless your health care provider told you to do so.  Do not use any tobacco products, including cigarettes, chewing tobacco, and e-cigarettes. If you need help quitting, ask your health care provider.  Wear loose-fitting clothing. Do not wear anything tight around your waist that causes pressure on your abdomen.  Raise (elevate) the head of your bed 6 inches (15cm).  Try to reduce your stress, such as with yoga or meditation. If you need help reducing stress, ask your health care provider.  If you are overweight, reduce your weight to an amount that is healthy for you. Ask your health care provider for guidance about a safe weight loss goal.  Keep all follow-up visits as told by your health care provider. This is important. SEEK MEDICAL CARE IF:  You have new symptoms.  You have unexplained weight loss.  You have difficulty swallowing, or it hurts to swallow.  You have wheezing or a persistent cough.  Your symptoms do not improve with treatment.  You have  a hoarse voice. SEEK IMMEDIATE MEDICAL CARE IF:  You have pain in your arms, neck, jaw, teeth, or back.  You feel sweaty, dizzy, or light-headed.  You have chest pain or shortness of breath.  You vomit and your vomit looks like blood or coffee grounds.  You faint.  Your stool is bloody or black.  You cannot swallow, drink, or eat.   This information is not intended to replace advice given to you by your health care provider. Make sure you discuss any questions you have with your health care provider.

## 2015-06-23 NOTE — Interval H&P Note (Signed)
History and Physical Interval Note:  06/23/2015 8:28 AM  Pamela Ford  has presented today for surgery, with the diagnosis of GERD  The various methods of treatment have been discussed with the patient and family. After consideration of risks, benefits and other options for treatment, the patient has consented to  Procedure(s) with comments: ESOPHAGOGASTRODUODENOSCOPY (EGD) (N/A) - 815 as a surgical intervention .  The patient's history has been reviewed, patient examined, no change in status, stable for surgery.  I have reviewed the patient's chart and labs.  Questions were answered to the patient's satisfaction.     Silver Parkey  No change other than Protonix caused diarrhea although it did help her reflux symptoms. Patient confirms no dysphagia.The risks, benefits, limitations, alternatives and imponderables have been reviewed with the patient. Potential for esophageal dilation, biopsy, etc. have also been reviewed.  Questions have been answered. All parties agreeable.

## 2015-06-25 ENCOUNTER — Encounter (HOSPITAL_COMMUNITY): Payer: Self-pay | Admitting: Internal Medicine

## 2015-08-11 DIAGNOSIS — E119 Type 2 diabetes mellitus without complications: Secondary | ICD-10-CM | POA: Diagnosis not present

## 2015-08-12 DIAGNOSIS — F329 Major depressive disorder, single episode, unspecified: Secondary | ICD-10-CM | POA: Diagnosis not present

## 2015-08-23 ENCOUNTER — Ambulatory Visit: Payer: Medicare Other | Admitting: Adult Health

## 2015-08-26 ENCOUNTER — Encounter: Payer: Self-pay | Admitting: *Deleted

## 2015-08-26 ENCOUNTER — Ambulatory Visit: Payer: Medicare Other | Admitting: Adult Health

## 2015-09-03 ENCOUNTER — Encounter: Payer: Self-pay | Admitting: Obstetrics & Gynecology

## 2015-09-03 ENCOUNTER — Ambulatory Visit (INDEPENDENT_AMBULATORY_CARE_PROVIDER_SITE_OTHER): Payer: Medicare Other | Admitting: Obstetrics & Gynecology

## 2015-09-03 VITALS — BP 154/98 | HR 96 | Ht 63.0 in | Wt 247.0 lb

## 2015-09-03 DIAGNOSIS — A499 Bacterial infection, unspecified: Secondary | ICD-10-CM

## 2015-09-03 DIAGNOSIS — N760 Acute vaginitis: Secondary | ICD-10-CM | POA: Diagnosis not present

## 2015-09-03 DIAGNOSIS — B9689 Other specified bacterial agents as the cause of diseases classified elsewhere: Secondary | ICD-10-CM

## 2015-09-03 MED ORDER — METRONIDAZOLE 500 MG PO TABS: 500.0000 mg | ORAL_TABLET | Freq: Two times a day (BID) | ORAL | Status: DC

## 2015-09-03 NOTE — Progress Notes (Signed)
Patient ID: Pamela Ford, female   DOB: [birth year], 30 y.o.   MRN: [id number]       Chief Complaint  Patient presents with  . Vaginal Discharge    Pt states "thinks she has a yeast infection."    Blood pressure 154/98, pulse 96, height  (1.6 m), weight 247 lb (112.038 kg), last menstrual period 08/29/2015.  29 y.o. G2P0111 [name] last menstrual period was 08/29/2015. The current method of family planning is none.  Subjective Vaginal discharge for 4days Itching no Irritation no Odor no Similar to previous no  Previous treatment yes  Objective Vulva:  normal appearing vulva with no masses, tenderness or lesions Vagina:  normal mucosa, thin grey discharge Cervix:  no cervical motion tenderness and no lesions Uterus:   Adnexa: ovaries:,       Pertinent ROS No burning with urination, frequency or urgency No nausea, vomiting or diarrhea Nor fever chills or other constitutional symptoms   Labs or studies Wet Prep:   A sample of vaginal discharge was obtained from the posterior fornix using a cotton swab. 2 drops of saline were placed on a slide and the cotton swab was immersed in the saline. Microscopic evaluation was performed and results were as follows:  Negative  for yeast  Positive for clue cells , consistent with Bacterial vaginosis Negative for trichomonas  Normal WBC population   Whiff test: Positive     Impression Diagnoses this Encounter::   ICD-9-CM ICD-10-CM   1. Bacterial vaginosis 616.10 N76.0    041.9 A49.9     Established relevant diagnosis(es):   Plan/Recommendations: Meds ordered this encounter  Medications  . RABEprazole (ACIPHEX) 20 MG tablet    Sig: Take 20 mg by mouth daily.   . metroNIDAZOLE (FLAGYL) 500 MG tablet    Sig: Take 1 tablet (500 mg total) by mouth 2 (two) times daily.    Dispense:  14 tablet    Refill:  0    Labs or Scans Ordered: No orders of the defined types were placed in this encounter.     Management::   Follow up Return if symptoms worsen or fail to improve.        All questions were answered.

## 2015-09-21 ENCOUNTER — Other Ambulatory Visit: Payer: Self-pay | Admitting: Obstetrics & Gynecology

## 2015-09-21 ENCOUNTER — Telehealth: Payer: Self-pay | Admitting: Adult Health

## 2015-09-21 ENCOUNTER — Ambulatory Visit: Payer: Medicare Other | Admitting: Gastroenterology

## 2015-09-21 ENCOUNTER — Encounter: Payer: Self-pay | Admitting: Gastroenterology

## 2015-09-21 ENCOUNTER — Telehealth: Payer: Self-pay | Admitting: Gastroenterology

## 2015-09-21 MED ORDER — FLUCONAZOLE 150 MG PO TABS: 150.0000 mg | ORAL_TABLET | Freq: Once | ORAL | Status: DC

## 2015-09-21 NOTE — Telephone Encounter (Signed)
Spoke with pt. Pt has vaginal irritation. She was on an antibiotic recently and thinks she has a yeast infection. Can you order Diflucan? Thanks!! JSY

## 2015-09-21 NOTE — Telephone Encounter (Signed)
PATIENT WAS A NO SHOW AND LETTER SENT  °

## 2015-09-23 ENCOUNTER — Telehealth: Payer: Self-pay | Admitting: Obstetrics & Gynecology

## 2015-09-23 NOTE — Telephone Encounter (Signed)
Pt c/o vaginal itching, no discharge. Pt states has one more dose of Diflucan to take tomorrow but would like some cream as well. Per Cathie Beams, CNM, pt can take OTC Monistat as well as the last dose of Diflucan. Pt verbalized understanding.

## 2015-09-23 NOTE — Telephone Encounter (Signed)
Pt called stating that she would like a nurse to give her a call back regarding a cream for a yeast infection. Please contact pt

## 2015-10-14 ENCOUNTER — Encounter: Payer: Self-pay | Admitting: Obstetrics & Gynecology

## 2015-10-14 ENCOUNTER — Ambulatory Visit (INDEPENDENT_AMBULATORY_CARE_PROVIDER_SITE_OTHER): Payer: Medicare Other | Admitting: Obstetrics & Gynecology

## 2015-10-14 VITALS — BP 100/70 | HR 78 | Wt 247.0 lb

## 2015-10-14 DIAGNOSIS — N72 Inflammatory disease of cervix uteri: Secondary | ICD-10-CM

## 2015-10-14 MED ORDER — METRONIDAZOLE 500 MG PO TABS: 500.0000 mg | ORAL_TABLET | Freq: Two times a day (BID) | ORAL | Status: DC

## 2015-10-14 NOTE — Progress Notes (Signed)
Patient ID: Pamela Ford, female   DOB: 04-17-1986, 30 y.o.   MRN: [id number]       Chief Complaint  Patient presents with  . gyn visit    vaginal discharge/ white in color    Blood pressure 100/70, pulse 78, weight 247 lb (112.038 kg), last menstrual period 09/30/2015.  30 y.o. G2P0111 [name] last menstrual period was 09/30/2015. The current method of family planning is none.  Subjective Vaginal discharge for 1weeks Itching no Irritation yes Odor no Similar to previous no  Previous treatment   Objective Vulva:  normal appearing vulva with no masses, tenderness or lesions Vagina:  normal mucosa, thin grey discharge Cervix:  no cervical motion tenderness and no lesions Uterus:  normal size, contour, position, consistency, mobility, non-tender Adnexa: ovaries:present,  normal adnexa in size, nontender and no masses     Pertinent ROS   Labs or studies Wet Prep:   A sample of vaginal discharge was obtained from the posterior fornix using a cotton swab. 2 drops of saline were placed on a slide and the cotton swab was immersed in the saline. Microscopic evaluation was performed and results were as follows:  Negative  for yeast  Negative for clue cells , consistent with Bacterial vaginosis Negative for trichomonas  heavy WBC population   Whiff test: Negative     Impression Diagnoses this Encounter::   ICD-9-CM ICD-10-CM   1. Cervicitis 616.0 N72     Established relevant diagnosis(es):   Plan/Recommendations: Meds ordered this encounter  Medications  . metroNIDAZOLE (FLAGYL) 500 MG tablet    Sig: Take 1 tablet (500 mg total) by mouth 2 (two) times daily.    Dispense:  14 tablet    Refill:  0    Labs or Scans Ordered: No orders of the defined types were placed in this encounter.    Management::   Follow up Return if symptoms worsen or fail to improve.      All questions were answered.

## 2015-10-15 ENCOUNTER — Ambulatory Visit: Payer: Medicare Other | Admitting: Gastroenterology

## 2015-10-19 DIAGNOSIS — K219 Gastro-esophageal reflux disease without esophagitis: Secondary | ICD-10-CM | POA: Diagnosis not present

## 2015-10-19 DIAGNOSIS — Z87891 Personal history of nicotine dependence: Secondary | ICD-10-CM | POA: Diagnosis not present

## 2015-10-19 DIAGNOSIS — F330 Major depressive disorder, recurrent, mild: Secondary | ICD-10-CM | POA: Diagnosis not present

## 2015-10-28 ENCOUNTER — Emergency Department (HOSPITAL_COMMUNITY)
Admission: EM | Admit: 2015-10-28 | Discharge: 2015-10-28 | Disposition: A | Discharge status: Home / Self Care | Payer: Medicare Other | Attending: Emergency Medicine | Admitting: Emergency Medicine

## 2015-10-28 ENCOUNTER — Encounter (HOSPITAL_COMMUNITY): Payer: Self-pay | Admitting: Emergency Medicine

## 2015-10-28 DIAGNOSIS — F17210 Nicotine dependence, cigarettes, uncomplicated: Secondary | ICD-10-CM | POA: Diagnosis not present

## 2015-10-28 DIAGNOSIS — R110 Nausea: Secondary | ICD-10-CM

## 2015-10-28 DIAGNOSIS — R252 Cramp and spasm: Secondary | ICD-10-CM | POA: Diagnosis not present

## 2015-10-28 DIAGNOSIS — N946 Dysmenorrhea, unspecified: Secondary | ICD-10-CM

## 2015-10-28 DIAGNOSIS — F329 Major depressive disorder, single episode, unspecified: Secondary | ICD-10-CM | POA: Insufficient documentation

## 2015-10-28 DIAGNOSIS — E669 Obesity, unspecified: Secondary | ICD-10-CM | POA: Diagnosis not present

## 2015-10-28 DIAGNOSIS — I10 Essential (primary) hypertension: Secondary | ICD-10-CM | POA: Diagnosis not present

## 2015-10-28 DIAGNOSIS — E119 Type 2 diabetes mellitus without complications: Secondary | ICD-10-CM | POA: Insufficient documentation

## 2015-10-28 DIAGNOSIS — Z791 Long term (current) use of non-steroidal anti-inflammatories (NSAID): Secondary | ICD-10-CM | POA: Diagnosis not present

## 2015-10-28 DIAGNOSIS — R1032 Left lower quadrant pain: Secondary | ICD-10-CM | POA: Diagnosis present

## 2015-10-28 DIAGNOSIS — R112 Nausea with vomiting, unspecified: Secondary | ICD-10-CM | POA: Diagnosis not present

## 2015-10-28 DIAGNOSIS — R042 Hemoptysis: Secondary | ICD-10-CM | POA: Diagnosis not present

## 2015-10-28 LAB — PREGNANCY, URINE: PREG TEST UR: NEGATIVE

## 2015-10-28 MED ORDER — SODIUM CHLORIDE 0.9 % IV BOLUS (SEPSIS)
1000.0000 mL | Freq: Once | INTRAVENOUS | Status: AC
  Administered 2015-10-28: 1000 mL via INTRAVENOUS

## 2015-10-28 MED ORDER — CYCLOBENZAPRINE HCL 10 MG PO TABS
10.0000 mg | ORAL_TABLET | Freq: Once | ORAL | Status: AC
  Administered 2015-10-28: 10 mg via ORAL

## 2015-10-28 MED ORDER — DIPHENHYDRAMINE HCL 50 MG/ML IJ SOLN
25.0000 mg | Freq: Once | INTRAMUSCULAR | Status: AC
  Administered 2015-10-28: 25 mg via INTRAVENOUS
  Filled 2015-10-28: qty 1

## 2015-10-28 MED ORDER — KETOROLAC TROMETHAMINE 30 MG/ML IJ SOLN
60.0000 mg | Freq: Once | INTRAMUSCULAR | Status: AC
  Administered 2015-10-28: 60 mg via INTRAMUSCULAR
  Filled 2015-10-28: qty 2

## 2015-10-28 MED ORDER — METOCLOPRAMIDE HCL 5 MG/ML IJ SOLN
10.0000 mg | Freq: Once | INTRAMUSCULAR | Status: AC
  Administered 2015-10-28: 10 mg via INTRAVENOUS
  Filled 2015-10-28: qty 2

## 2015-10-28 MED ORDER — ONDANSETRON HCL 4 MG PO TABS: 4.0000 mg | ORAL_TABLET | Freq: Three times a day (TID) | ORAL | Status: DC | PRN

## 2015-10-28 MED ORDER — CYCLOBENZAPRINE HCL 10 MG PO TABS
ORAL_TABLET | ORAL | Status: AC
  Filled 2015-10-28: qty 1

## 2015-10-28 MED ORDER — SODIUM CHLORIDE 0.9 % IV BOLUS (SEPSIS): 1000.0000 mL | Freq: Once | INTRAVENOUS | Status: DC

## 2015-10-28 MED ORDER — ONDANSETRON 8 MG PO TBDP
8.0000 mg | ORAL_TABLET | Freq: Once | ORAL | Status: AC
  Administered 2015-10-28: 8 mg via ORAL
  Filled 2015-10-28: qty 1

## 2015-10-28 MED ORDER — ONDANSETRON 4 MG PO TBDP
4.0000 mg | ORAL_TABLET | Freq: Once | ORAL | Status: AC
  Administered 2015-10-28: 4 mg via ORAL
  Filled 2015-10-28: qty 1

## 2015-10-28 NOTE — ED Provider Notes (Signed)
CSN: [id number]     Arrival date & time 10/28/15  0031 History   First MD Initiated Contact with Patient 10/28/15 0130     Chief Complaint  Patient presents with  . Abdominal Cramping     (Consider location/radiation/quality/duration/timing/severity/associated sxs/prior Treatment) HPI patient reports she has had painful. Since they started. She states she is followed at [facility] and they prescribed naproxen which she states sometimes helps. She states she took a naproxen earlier tonight at 8 PM however when I asked her earlier she said she had not taken anything. She states about 9:00 she vomited. She states she has vomited about 6-7 times throughout the day. She states her menses started yesterday. She is G2 P1 Ab1. She does not use any thing for birth control. She denies fever, dysuria, frequency, or diarrhea. She states today her pain is in the left lower quadrant and it varies from month to month for her pain is located.  [facility]N [facility]  Past Medical History  Diagnosis Date  . Acid reflux   . Depression   . Anxiety   . Impacted tooth 09/2013    impacted wisdom teeth  . Leg pain, right     unknown cause, is being evaluated  . 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 Obesity   . Dysmenorrhea 03/31/2014  . Hypertension   . Vaginal discharge 07/06/2014  . BV (bacterial vaginosis) 07/06/2014  . UTI (lower urinary tract infection) 09/03/2014  . Irregular periods 09/03/2014  . Yeast infection 09/21/2014   Past Surgical History  Procedure Laterality Date  . No past surgeries    . Tooth extraction N/A 10/06/2013    Procedure: EXTRACTION THIRD MOLARS;  Surgeon: Georgia Lopes, DDS;  Location: [facility];  Service: Oral Surgery;  Laterality: N/A;  . Esophagogastroduodenoscopy N/A 06/23/2015    RMR: Retained gastric contents. Not all the stomach seen, however, upper GI tract that was seen appeared normal. Patient reports las intake of solid food 14  hours prior to this procedure. Therefore, Patient likely has an element of delayed gasric emptying. this could be mediction effect and possibly a result underlying diabetic gastroparesis. Intoleratnt to multiple PPI's. Most recently pantoprozo   Family History  Problem Relation Age of Onset  . Diabetes Mother   . Hypertension Mother   . Diabetes Father   . Preterm labor Son     preterm delivery at 35 weeks  . Kidney disease Maternal Grandmother     was on dialysis  . Heart disease Maternal Grandmother   . Heart attack Maternal Grandfather   . Heart attack Paternal Grandfather   . Cancer Maternal Aunt   . Cancer Paternal Aunt   . Colon cancer Neg Hx    Social History  Substance Use Topics  . Smoking status: Current Every Day Smoker -- 0.03 packs/day for 5 years    Types: Cigarettes  . Smokeless tobacco: Never Used     Comment: 3 cig./day  . Alcohol Use: No   unemployed  OB History    Gravida Para Term Preterm AB TAB SAB Ectopic Multiple Living       Review of Systems  All other systems reviewed and are negative.     Allergies  Review of patient's allergies indicates no known allergies.  Home Medications   NONE except naproxen   Prior to Admission medications   Medication Sig Start Date  End Date Taking? Authorizing Provider  naproxen sodium (ANAPROX) 550 MG tablet Take 1  2-3 times daily prn for periods cramps 04/01/15  Yes Adline Potter, NP  busPIRone (BUSPAR) 15 MG tablet Take 15 mg by mouth 3 (three) times daily.  06/15/14   Historical Provider, MD  metroNIDAZOLE (FLAGYL) 500 MG tablet Take 1 tablet (500 mg total) by mouth 2 (two) times daily. 10/14/15   Lazaro Arms, MD  norgestimate-ethinyl estradiol (ORTHO-CYCLEN,SPRINTEC,PREVIFEM) 0.25-35 MG-MCG tablet Take 1 tablet by mouth daily. 05/27/15   Lazaro Arms, MD  ondansetron (ZOFRAN) 4 MG tablet Take 1 tablet (4 mg total) by mouth every 8 (eight) hours as needed for nausea or vomiting.  10/28/15   Devoria Albe, MD  PARoxetine (PAXIL) 40 MG tablet Take 40 mg by mouth daily.  03/27/14   Historical Provider, MD   BP 129/87 mmHg  Pulse 84  Temp(Src) 98.3 F (36.8 C) (Oral)  Resp 18  Ht  (1.6 m)  Wt 240 lb (108.863 kg)  BMI 42.52 kg/m2  SpO2 96%  LMP 10/26/2015 (Exact Date)  Vital signs normal   Physical Exam  Constitutional: She is oriented to person, place, and time. She appears well-developed and well-nourished.  Non-toxic appearance. She does not appear ill. No distress.  HENT:  Head: Normocephalic and atraumatic.  Right Ear: External ear normal.  Left Ear: External ear normal.  Nose: Nose normal. No mucosal edema or rhinorrhea.  Mouth/Throat: Oropharynx is clear and moist and mucous membranes are normal. No dental abscesses or uvula swelling.  Eyes: Conjunctivae and EOM are normal. Pupils are equal, round, and reactive to light.  Neck: Normal range of motion and full passive range of motion without pain. Neck supple.  Cardiovascular: Normal rate, regular rhythm and normal heart sounds.  Exam reveals no gallop and no friction rub.   No murmur heard. Pulmonary/Chest: Effort normal and breath sounds normal. No respiratory distress. She has no wheezes. She has no rhonchi. She has no rales. She exhibits no tenderness and no crepitus.  Abdominal: Soft. Normal appearance and bowel sounds are normal. She exhibits no distension. There is tenderness. There is no rebound and no guarding.    Musculoskeletal: Normal range of motion. She exhibits no edema or tenderness.  Moves all extremities well.   Neurological: She is alert and oriented to person, place, and time. She has normal strength. No cranial nerve deficit.  Skin: Skin is warm, dry and intact. No rash noted. No erythema. No pallor.  Psychiatric: She has a normal mood and affect. Her speech is normal and behavior is normal. Her mood appears not anxious.  Nursing note and vitals reviewed.   ED Course  Procedures  (including critical care time)  Medications  sodium chloride 0.9 % bolus 1,000 mL (not administered)  ketorolac (TORADOL) 30 MG/ML injection 60 mg (60 mg Intramuscular Given 10/28/15 0148)  ondansetron (ZOFRAN-ODT) disintegrating tablet 8 mg (8 mg Oral Given 10/28/15 0148)  cyclobenzaprine (FLEXERIL) tablet 10 mg ( Oral Not Given 10/28/15 0238)  ondansetron (ZOFRAN-ODT) disintegrating tablet 4 mg (4 mg Oral Given 10/28/15 0458)  sodium chloride 0.9 % bolus 1,000 mL (1,000 mLs Intravenous New Bag/Given 10/28/15 0553)  metoCLOPramide (REGLAN) injection 10 mg (10 mg Intravenous Given 10/28/15 0552)  diphenhydrAMINE (BENADRYL) injection 25 mg (25 mg Intravenous Given 10/28/15 0551)    Patient was given Zofran ODT. She was given Toradol IM for pain. She continued to complain of "spitting". She was given Flexeril. Patient still stated  she was feeling better. She was given IV fluids and IV Benadryl and Reglan after which she feel much improved and feels ready to be discharged.   Labs Review Results for orders placed or performed during the hospital encounter of 10/28/15  Pregnancy, urine  Result Value Ref Range   Preg Test, Ur NEGATIVE NEGATIVE     MDM   Final diagnoses:  Menstrual cramps  Nausea    New Prescriptions   ONDANSETRON (ZOFRAN) 4 MG TABLET    Take 1 tablet (4 mg total) by mouth every 8 (eight) hours as needed for nausea or vomiting.    Plan discharge  Devoria AlbeIva Piotr Christopher, MD, [facility]oFACEP     Shantee Hayne, MD 10/28/15 [phone]

## 2015-10-28 NOTE — ED Notes (Signed)
Pt states that she is on her period and having bad cramps.  She states this is normal for her.

## 2015-10-28 NOTE — ED Notes (Signed)
Pt states nausea is improved

## 2015-10-28 NOTE — ED Notes (Signed)
Went to room to given pt urine cup and pt has trash can next to bed.  Pt states she is coughing up large amounts of phlegm

## 2015-10-28 NOTE — Discharge Instructions (Signed)
Go home and rest. Drink plenty of fluids. Use the zofran for nausea or vomiting. Continue the naproxen for pain as needed.  Dysmenorrhea Menstrual cramps (dysmenorrhea) are caused by the muscles of the uterus tightening (contracting) during a menstrual period. For some women, this discomfort is merely bothersome. For others, dysmenorrhea can be severe enough to interfere with everyday activities for a few days each month. Primary dysmenorrhea is menstrual cramps that last a couple of days when you start having menstrual periods or soon after. This often begins after a teenager starts having her period. As a woman gets older or has a baby, the cramps will usually lessen or disappear. Secondary dysmenorrhea begins later in life, lasts longer, and the pain may be stronger than primary dysmenorrhea. The pain may start before the period and last a few days after the period.  CAUSES  Dysmenorrhea is usually caused by an underlying problem, such as:  The tissue lining the uterus grows outside of the uterus in other areas of the body (endometriosis).  The endometrial tissue, which normally lines the uterus, is found in or grows into the muscular walls of the uterus (adenomyosis).  The pelvic blood vessels are engorged with blood just before the menstrual period (pelvic congestive syndrome).  Overgrowth of cells (polyps) in the lining of the uterus or cervix.  Falling down of the uterus (prolapse) because of loose or stretched ligaments.  Depression.  Bladder problems, infection, or inflammation.  Problems with the intestine, a tumor, or irritable bowel syndrome.  Cancer of the female organs or bladder.  A severely tipped uterus.  A very tight opening or closed cervix.  Noncancerous tumors of the uterus (fibroids).  Pelvic inflammatory disease (PID).  Pelvic scarring (adhesions) from a previous surgery.  Ovarian cyst.  An intrauterine device (IUD) used for birth control. RISK  FACTORS You may be at greater risk of dysmenorrhea if:  You are younger than age 28.  You started puberty early.  You have irregular or heavy bleeding.  You have never given birth.  You have a family history of this problem.  You are a smoker. SIGNS AND SYMPTOMS   Cramping or throbbing pain in your lower abdomen.  Headaches.  Lower back pain.  Nausea or vomiting.  Diarrhea.  Sweating or dizziness.  Loose stools. DIAGNOSIS  A diagnosis is based on your history, symptoms, physical exam, diagnostic tests, or procedures. Diagnostic tests or procedures may include:  Blood tests.  Ultrasonography.  An examination of the lining of the uterus (dilation and curettage, D&C).  An examination inside your abdomen or pelvis with a scope (laparoscopy).  X-rays.  CT scan.  MRI.  An examination inside the bladder with a scope (cystoscopy).  An examination inside the intestine or stomach with a scope (colonoscopy, gastroscopy). TREATMENT  Treatment depends on the cause of the dysmenorrhea. Treatment may include:  Pain medicine prescribed by your health care provider.  Birth control pills or an IUD with progesterone hormone in it.  Hormone replacement therapy.  Nonsteroidal anti-inflammatory drugs (NSAIDs). These may help stop the production of prostaglandins.  Surgery to remove adhesions, endometriosis, ovarian cyst, or fibroids.  Removal of the uterus (hysterectomy).  Progesterone shots to stop the menstrual period.  Cutting the nerves on the sacrum that go to the female organs (presacral neurectomy).  Electric current to the sacral nerves (sacral nerve stimulation).  Antidepressant medicine.  Psychiatric therapy, counseling, or group therapy.  Exercise and physical therapy.  Meditation and yoga therapy.  Acupuncture.  HOME CARE INSTRUCTIONS   Only take over-the-counter or prescription medicines as directed by your health care provider.  Place a  heating pad or hot water bottle on your lower back or abdomen. Do not sleep with the heating pad.  Use aerobic exercises, walking, swimming, biking, and other exercises to help lessen the cramping.  Massage to the lower back or abdomen may help.  Stop smoking.  Avoid alcohol and caffeine. SEEK MEDICAL CARE IF:   Your pain does not get better with medicine.  You have pain with sexual intercourse.  Your pain increases and is not controlled with medicines.  You have abnormal vaginal bleeding with your period.  You develop nausea or vomiting with your period that is not controlled with medicine. SEEK IMMEDIATE MEDICAL CARE IF:  You pass out.    This information is not intended to replace advice given to you by your health care provider. Make sure you discuss any questions you have with your health care provider.   Document Released: 07/10/2005 Document Revised: 03/12/2013 Document Reviewed: 12/26/2012 Elsevier Interactive Patient Education [employer]

## 2015-10-28 NOTE — ED Notes (Signed)
Patient with no complaints at this time. Respirations even and unlabored. Skin warm/dry. Discharge instructions reviewed with patient at this time. Patient given opportunity to voice concerns/ask questions. IV removed per policy and band-aid applied to site. Patient discharged at this time and left Emergency Department with steady gait.  

## 2015-11-03 ENCOUNTER — Telehealth: Payer: Self-pay | Admitting: Obstetrics & Gynecology

## 2015-11-03 NOTE — Telephone Encounter (Signed)
Pt states that she is having brownish discharge when she wipes. Pt states that the discharge is worse when coming off her cycle. Pt states that her period ended last Saturday and the brownish discharge started a few days before her cycle ended. Pt states that she is having some vaginal irritation as well. Pt states that she has been using a cream but its not helping that much. Pt has an appointment the 18th but feels she needs to be seen sooner. Appointment was changed to tomorrow at 2:15.

## 2015-11-04 ENCOUNTER — Ambulatory Visit (INDEPENDENT_AMBULATORY_CARE_PROVIDER_SITE_OTHER): Payer: Medicare Other | Admitting: Obstetrics & Gynecology

## 2015-11-04 ENCOUNTER — Encounter: Payer: Self-pay | Admitting: Obstetrics & Gynecology

## 2015-11-04 VITALS — BP 112/84 | HR 83 | Ht 63.0 in | Wt 244.0 lb

## 2015-11-04 DIAGNOSIS — N72 Inflammatory disease of cervix uteri: Secondary | ICD-10-CM | POA: Diagnosis not present

## 2015-11-04 MED ORDER — NORGESTIMATE-ETH ESTRADIOL 0.25-35 MG-MCG PO TABS: 1.0000 | ORAL_TABLET | Freq: Every day | ORAL | Status: DC

## 2015-11-04 MED ORDER — DOXYCYCLINE HYCLATE 100 MG PO TABS: 100.0000 mg | ORAL_TABLET | Freq: Two times a day (BID) | ORAL | Status: DC

## 2015-11-04 NOTE — Progress Notes (Signed)
Patient ID: Rayfield Citizenanika S Gorby, female   DOB: Aug 30, 1985, 30 y.o.   MRN: [id number] Patient ID: Rayfield Citizenanika S Wahlert, female   DOB: Aug 30, 1985, 30 y.o.   MRN: [id number]       Chief Complaint  Patient presents with  . gyn visit    browish vaginal discharge    Blood pressure 112/84, pulse 83, height 5\' 3"  (1.6 m), weight 244 lb (110.678 kg), last menstrual period 10/26/2015.  29 y.o. G2P0111 [name] last menstrual period was 10/26/2015 (exact date). The current method of family planning is none.  Subjective Vaginal discharge for 1weeks Itching no Irritation yes Odor no Similar to previous no  Previous treatment   Objective Vulva:  normal appearing vulva with no masses, tenderness or lesions Vagina:  normal mucosa, thin grey discharge Cervix:  no cervical motion tenderness and no lesions Uterus:  normal size, contour, position, consistency, mobility, non-tender Adnexa: ovaries:present,  normal adnexa in size, nontender and no masses     Pertinent ROS   Labs or studies Wet Prep:   A sample of vaginal discharge was obtained from the posterior fornix using a cotton swab. 2 drops of saline were placed on a slide and the cotton swab was immersed in the saline. Microscopic evaluation was performed and results were as follows:  Negative  for yeast  Negative for clue cells , consistent with Bacterial vaginosis Negative for trichomonas  heavy WBC population   Whiff test: Negative     Impression Diagnoses this Encounter::   ICD-9-CM ICD-10-CM   1. Cervicitis 616.0 N72     Established relevant diagnosis(es):   Plan/Recommendations: Meds ordered this encounter  Medications  . doxycycline (VIBRA-TABS) 100 MG tablet    Sig: Take 1 tablet (100 mg total) by mouth 2 (two) times daily.    Dispense:  20 tablet    Refill:  0  . norgestimate-ethinyl estradiol (ORTHO-CYCLEN,SPRINTEC,PREVIFEM) 0.25-35 MG-MCG tablet    Sig: Take 1 tablet by mouth daily.    Dispense:  1 Package   Refill:  11    Labs or Scans Ordered: No orders of the defined types were placed in this encounter.    Management::   Follow up Return in about 2 weeks (around 11/18/2015) for Follow up wet prep, with Dr Despina HiddenEure.      All questions were answered.

## 2015-11-09 ENCOUNTER — Ambulatory Visit: Payer: Medicare Other | Admitting: Gastroenterology

## 2015-11-09 ENCOUNTER — Ambulatory Visit: Payer: Medicare Other | Admitting: Obstetrics & Gynecology

## 2015-11-18 ENCOUNTER — Encounter: Payer: Self-pay | Admitting: Obstetrics & Gynecology

## 2015-11-18 ENCOUNTER — Ambulatory Visit (INDEPENDENT_AMBULATORY_CARE_PROVIDER_SITE_OTHER): Payer: Medicare Other | Admitting: Obstetrics & Gynecology

## 2015-11-18 VITALS — BP 120/76 | Ht 63.0 in | Wt 250.0 lb

## 2015-11-18 DIAGNOSIS — B3731 Acute candidiasis of vulva and vagina: Secondary | ICD-10-CM

## 2015-11-18 DIAGNOSIS — B373 Candidiasis of vulva and vagina: Secondary | ICD-10-CM | POA: Diagnosis not present

## 2015-11-18 DIAGNOSIS — N899 Noninflammatory disorder of vagina, unspecified: Secondary | ICD-10-CM | POA: Diagnosis not present

## 2015-11-18 MED ORDER — TERCONAZOLE 0.4 % VA CREA: 1.0000 | TOPICAL_CREAM | Freq: Every day | VAGINAL | Status: DC

## 2015-11-18 NOTE — Progress Notes (Signed)
Patient ID: Pamela Ford, female   DOB: 01/12/1986, 30 y.o.   MRN: [id number]       Chief Complaint  Patient presents with  . Follow-up    wet prep     Blood pressure 120/76, height 5\' 3"  (1.6 m), weight 250 lb (113.399 kg), last menstrual period 10/26/2015.  29 y.o. G2P0111 [name] last menstrual period was 10/26/2015 (exact date). The current method of family planning is OCP (estrogen/progesterone).  Subjective Vaginal discharge for fe weeks Itching yes Irritation yes Odor no Similar to previous no  Previous treatment flagul 500 BID  Objective Vulva:  normal appearing vulva with no masses, tenderness or lesions Vagina:  normal mucosa, curd-like discharge Cervix:  no cervical motion tenderness and no lesions Uterus:  normal size, contour, position, consistency, mobility, non-tender Adnexa: ovaries:,       Pertinent ROS No burning with urination, frequency or urgency No nausea, vomiting or diarrhea Nor fever chills or other constitutional symptoms   Labs or studies Wet Prep:   A sample of vaginal discharge was obtained from the posterior fornix using a cotton swab. 2 drops of saline were placed on a slide and the cotton swab was immersed in the saline. Microscopic evaluation was performed and results were as follows:  Positive  for yeast  Negative for clue cells , consistent with Bacterial vaginosis Negative for trichomonas  Normal WBC population   Whiff test: Negative     Impression Diagnoses this Encounter::   ICD-9-CM ICD-10-CM   1. Yeast vaginitis 112.1 B37.3     Established relevant diagnosis(es):   Plan/Recommendations: Meds ordered this encounter  Medications  . terconazole (TERAZOL 7) 0.4 % vaginal cream    Sig: Place 1 applicator vaginally at bedtime.    Dispense:  45 g    Refill:  0    Labs or Scans Ordered: No orders of the defined types were placed in this encounter.    Management:: terazol 7, was able to push the  microenvironment back to acidic which means the original vaginal state has been corrected  Follow up Return if symptoms worsen or fail to improve.     All questions were answered.

## 2015-11-22 ENCOUNTER — Telehealth: Payer: Self-pay | Admitting: Obstetrics & Gynecology

## 2015-11-22 MED ORDER — FLUCONAZOLE 150 MG PO TABS: 150.0000 mg | ORAL_TABLET | Freq: Once | ORAL | Status: DC

## 2015-11-29 ENCOUNTER — Other Ambulatory Visit: Payer: Self-pay

## 2015-11-29 ENCOUNTER — Ambulatory Visit (INDEPENDENT_AMBULATORY_CARE_PROVIDER_SITE_OTHER): Payer: Medicare Other | Admitting: Gastroenterology

## 2015-11-29 ENCOUNTER — Encounter: Payer: Self-pay | Admitting: Gastroenterology

## 2015-11-29 VITALS — BP 137/87 | HR 78 | Temp 97.5°F | Ht 63.0 in | Wt 245.0 lb

## 2015-11-29 DIAGNOSIS — R110 Nausea: Secondary | ICD-10-CM

## 2015-11-29 DIAGNOSIS — K219 Gastro-esophageal reflux disease without esophagitis: Secondary | ICD-10-CM

## 2015-11-29 DIAGNOSIS — R112 Nausea with vomiting, unspecified: Secondary | ICD-10-CM

## 2015-11-29 MED ORDER — PANTOPRAZOLE SODIUM 40 MG PO TBEC: 40.0000 mg | DELAYED_RELEASE_TABLET | Freq: Every day | ORAL | Status: DC

## 2015-11-29 NOTE — Patient Instructions (Signed)
1. Pantoprazole once daily for acid reflux. 2. Solid phase gastric empyting test as ordered. 3. Return to the office in six months or sooner if needed.

## 2015-11-29 NOTE — Assessment & Plan Note (Signed)
Restart pantoprazole 40 mg daily. If she develops recurrent diarrhea, she will let me know. Possible idiopathic gastroparesis. Given ongoing symptoms, plan on gastric emptying study. Further recommendations to follow.

## 2015-11-29 NOTE — Progress Notes (Signed)
      Primary Care Physician: Avon GullyFANTA,TESFAYE, MD  Primary Gastroenterologist:  Roetta SessionsMichael Rourk, MD   Chief Complaint  Patient presents with  . Follow-up  . Medication Refill    HPI: Pamela Ford is a 30 y.o. female here for follow-up of GERD. Previously failed Nexium, omeprazole, Prevacid. Nexium caused diarrhea. EGD on November 2016 for refractory GERD showed retained gastric contents, patient reported solid food last intake and 14 hours prior to the procedure. Otherwise upper GI tract unremarkable.She had reported some diarrhea with pantoprazole and subsequently switched to AcipHex. She tells me AcipHex caused diarrhea worse and with the pantoprazole. She prefers to stay off pantoprazole. Otherwise has nausea on a regular basis. Complains of early satiety. No vomiting. No weight loss. Denies abdominal pain. No blood in the stool or melena. Generally stools are regular.  Current Outpatient Prescriptions  Medication Sig Dispense Refill  . busPIRone (BUSPAR) 15 MG tablet     . naproxen sodium (ANAPROX) 550 MG tablet     . norgestimate-ethinyl estradiol (ORTHO-CYCLEN,SPRINTEC,PREVIFEM) 0.25-35 MG-MCG tablet Take 1 tablet by mouth daily. 1 Package 11  . ondansetron (ZOFRAN) 4 MG tablet Take 1 tablet (4 mg total) by mouth every 8 (eight) hours as needed for nausea or vomiting. 12 tablet 0  . Pantoprazole Sodium (PROTONIX PO) Take by mouth.    Marland Kitchen. PARoxetine (PAXIL) 40 MG tablet Take 40 mg by mouth daily.     Marland Kitchen. terconazole (TERAZOL 7) 0.4 % vaginal cream Place 1 applicator vaginally at bedtime. 45 g 0   No current facility-administered medications for this visit.    Allergies as of 11/29/2015  . (No Known Allergies)    ROS:  General: Negative for anorexia, weight loss, fever, chills, fatigue, weakness. ENT: Negative for hoarseness, difficulty swallowing , nasal congestion. CV: Negative for chest pain, angina, palpitations, dyspnea on exertion, peripheral edema.  Respiratory: Negative  for dyspnea at rest, dyspnea on exertion, cough, sputum, wheezing.  GI: See history of present illness. GU:  Negative for dysuria, hematuria, urinary incontinence, urinary frequency, nocturnal urination.  Endo: Negative for unusual weight change.    Physical Examination:   BP 137/87 mmHg  Pulse 78  Temp(Src) 97.5 F (36.4 C)  Ht 5\' 3"  (1.6 m)  Wt 245 lb (111.131 kg)  BMI 43.41 kg/m2  LMP 11/26/2015 (Approximate)  General: Well-nourished, well-developed in no acute distress.  Eyes: No icterus. Mouth: Oropharyngeal mucosa moist and pink , no lesions erythema or exudate. Lungs: Clear to auscultation bilaterally.  Heart: Regular rate and rhythm, no murmurs rubs or gallops.  Abdomen: Bowel sounds are normal, nontender, nondistended, no hepatosplenomegaly or masses, no abdominal bruits or hernia , no rebound or guarding.   Extremities: No lower extremity edema. No clubbing or deformities. Neuro: Alert and oriented x 4   Skin: Warm and dry, no jaundice.   Psych: Alert and cooperative, normal mood and affect.

## 2015-11-29 NOTE — Progress Notes (Signed)
cc'ed to pcp °

## 2015-12-02 ENCOUNTER — Encounter (HOSPITAL_COMMUNITY): Payer: Self-pay

## 2015-12-02 ENCOUNTER — Ambulatory Visit (HOSPITAL_COMMUNITY)
Admission: RE | Admit: 2015-12-02 | Discharge: 2015-12-02 | Disposition: A | Discharge status: Home / Self Care | Payer: Medicare Other | Source: Ambulatory Visit | Attending: Gastroenterology | Admitting: Gastroenterology

## 2015-12-02 DIAGNOSIS — R112 Nausea with vomiting, unspecified: Secondary | ICD-10-CM

## 2015-12-02 DIAGNOSIS — R140 Abdominal distension (gaseous): Secondary | ICD-10-CM | POA: Diagnosis not present

## 2015-12-02 MED ORDER — TECHNETIUM TC 99M SULFUR COLLOID
2.0000 | Freq: Once | INTRAVENOUS | Status: AC | PRN
  Administered 2015-12-02: 2 via ORAL

## 2015-12-06 NOTE — Progress Notes (Signed)
Quick Note:  Please let patient know her ges is normal. Continue with plans as outlined at time of last ov. ______

## 2015-12-17 ENCOUNTER — Telehealth: Payer: Self-pay | Admitting: Obstetrics & Gynecology

## 2015-12-17 NOTE — Telephone Encounter (Signed)
Pt called stating that she would like a refill of her Naproxen and another medication, I told pt there is no Doctor's in the office and it wont get seen until Tuesday. Please contact pt

## 2015-12-17 NOTE — Telephone Encounter (Signed)
Spoke with pt. Pt is requesting a refill on Naproxen and Zofran. Please advise. Thanks!! JSY

## 2015-12-21 ENCOUNTER — Telehealth: Payer: Self-pay | Admitting: Obstetrics & Gynecology

## 2015-12-21 MED ORDER — NAPROXEN SODIUM 550 MG PO TABS: 550.0000 mg | ORAL_TABLET | Freq: Two times a day (BID) | ORAL | Status: DC

## 2015-12-21 MED ORDER — ONDANSETRON HCL 4 MG PO TABS: 4.0000 mg | ORAL_TABLET | Freq: Three times a day (TID) | ORAL | Status: DC | PRN

## 2016-01-06 ENCOUNTER — Other Ambulatory Visit: Payer: Medicare Other | Admitting: Adult Health

## 2016-01-11 ENCOUNTER — Telehealth: Payer: Self-pay | Admitting: Internal Medicine

## 2016-01-11 NOTE — Telephone Encounter (Signed)
Pt called to ask to speak with the nurse. I told her the nurse was at lunch and I could transfer to VM. Pt agreed

## 2016-01-11 NOTE — Telephone Encounter (Signed)
Pt left a voicemail- she said the pantoprazole was causing her to have diarrhea.

## 2016-01-12 MED ORDER — DEXLANSOPRAZOLE 60 MG PO CPDR: 60.0000 mg | DELAYED_RELEASE_CAPSULE | Freq: Every day | ORAL | Status: DC

## 2016-01-12 NOTE — Telephone Encounter (Signed)
Pt is aware. She has medicare and cannot use rebate card. I checked the pts formulary and it is tier 3. Told her to call me if she couldn't take it.

## 2016-01-12 NOTE — Telephone Encounter (Addendum)
She has failed omeprazole and prevaicd. She had diarrhea on Nexium, Aciphex, and now pantoprazole. Options are very limited.   I sent in dexilant RX. If she is eligible for rebate, please give her card. I'm not sure if she has ever taken Dexilant or if cost was an issue before.

## 2016-01-12 NOTE — Addendum Note (Signed)
Addended by: Tiffany KocherLEWIS, LESLIE S on: 01/12/2016 08:11 AM   Modules accepted: Orders

## 2016-01-13 ENCOUNTER — Other Ambulatory Visit (HOSPITAL_COMMUNITY)
Admission: RE | Admit: 2016-01-13 | Discharge: 2016-01-13 | Disposition: A | Discharge status: Home / Self Care | Payer: Medicare Other | Source: Ambulatory Visit | Attending: Adult Health | Admitting: Adult Health

## 2016-01-13 ENCOUNTER — Ambulatory Visit (INDEPENDENT_AMBULATORY_CARE_PROVIDER_SITE_OTHER): Payer: Medicare Other | Admitting: Adult Health

## 2016-01-13 ENCOUNTER — Encounter: Payer: Self-pay | Admitting: Adult Health

## 2016-01-13 VITALS — BP 130/88 | HR 72 | Ht 63.0 in | Wt 239.0 lb

## 2016-01-13 DIAGNOSIS — Z113 Encounter for screening for infections with a predominantly sexual mode of transmission: Secondary | ICD-10-CM | POA: Diagnosis not present

## 2016-01-13 DIAGNOSIS — Z124 Encounter for screening for malignant neoplasm of cervix: Secondary | ICD-10-CM

## 2016-01-13 DIAGNOSIS — E669 Obesity, unspecified: Secondary | ICD-10-CM

## 2016-01-13 DIAGNOSIS — Z01419 Encounter for gynecological examination (general) (routine) without abnormal findings: Secondary | ICD-10-CM | POA: Diagnosis not present

## 2016-01-13 MED ORDER — PRENATAL PLUS 27-1 MG PO TABS: 1.0000 | ORAL_TABLET | Freq: Every day | ORAL | Status: DC

## 2016-01-13 NOTE — Patient Instructions (Signed)
Physical in 1 year Pap in 3 years if normal Take prenatals Decrease sodas and carbs  Preparing for Pregnancy Before trying to become pregnant, make an appointment with your health care provider (preconception care). The goal is to help you have a healthy, safe pregnancy. At your first appointment, your health care provider will:   Do a complete physical exam, including a Pap test.  Take a complete medical history.  Give you advice and help you resolve any problems. PRECONCEPTION CHECKLIST Here is a list of the basics to cover with your health care provider at your preconception visit:  Medical history.  Tell your health care provider about any diseases you have had. Many diseases can affect your pregnancy.  Include your partner's medical history and family history.  Make sure you have been tested for sexually transmitted infections (STIs). These can affect your pregnancy. In some cases, they can be passed to your baby. Tell your health care provider about any history of STIs.  Make sure your health care provider knows about any previous problems you have had with conception or pregnancy.  Tell your health care provider about any medicine you take. This includes herbal supplements and over-the-counter medicines.  Make sure all your immunizations are up to date. You may need to make additional appointments.  Ask your health care provider if you need any vaccinations or if there are any you should avoid.  Diet.  It is especially important to eat a healthy, balanced diet with the right nutrients when you are pregnant.  Ask your health care provider to help you get to a healthy weight before pregnancy.  If you are overweight, you are at higher risk for certain complications. These include high blood pressure, diabetes, and preterm birth.  If you are underweight, you are more likely to have a low-birth-weight baby.  Lifestyle.  Tell your health care provider about lifestyle  factors such as alcohol use, drug use, or smoking.  Describe any harmful substances you may be exposed to at work or home. These can include chemicals, pesticides, and radiation.  Mental health.  Let your health care provider know if you have been feeling depressed or anxious.  Let your health care provider know if you have a history of substance abuse.  Let your health care provider know if you do not feel safe at home. HOME INSTRUCTIONS TO PREPARE FOR PREGNANCY Follow your health care provider's advice and instructions.   Keep an accurate record of your menstrual periods. This makes it easier for your health care provider to determine your baby's due date.  Begin taking prenatal vitamins and folic acid supplements daily. Take them as directed by your health care provider.  Eat a balanced diet. Get help from a nutrition counselor if you have questions or need help.  Get regular exercise. Try to be active for at least 30 minutes a day most days of the week.  Quit smoking, if you smoke.  Do not drink alcohol.  Do not take illegal drugs.  Get medical problems, such as diabetes or high blood pressure, under control.  If you have diabetes, make sure you do the following:  Have good blood sugar control. If you have type 1 diabetes, use multiple daily doses of insulin. Do not use split-dose or premixed insulin.  Have an eye exam by a qualified eye care professional trained in caring for people with diabetes.  Get evaluated by your health care provider for cardiovascular disease.  Get to a healthy  weight. If you are overweight or obese, reduce your weight with the help of a qualified health professional such as a Museum/gallery exhibitions officerregistered dietitian. Ask your health care provider what the right weight range is for you. HOW DO I KNOW I AM PREGNANT? You may be pregnant if you have been sexually active and you miss your period. Symptoms of early pregnancy include:   Mild cramping.  Very light  vaginal bleeding (spotting).  Feeling unusually tired.  Morning sickness. If you have any of these symptoms, take a home pregnancy test. These tests look for a hormone called human chorionic gonadotropin (hCG) in your urine. Your body begins to make this hormone during early pregnancy. These tests are very accurate. Wait until at least the first day you miss your period to take one. If you get a positive result, call your health care provider to make appointments for prenatal care. WHAT SHOULD I DO IF I BECOME PREGNANT?  Make an appointment with your health care provider by week 12 of your pregnancy at the latest.  Do not smoke. Smoking can be harmful to your baby.  Do not drink alcoholic beverages. Alcohol is related to a number of birth defects.  Avoid toxic odors and chemicals.  You may continue to have sexual intercourse if it does not cause pain or other problems, such as vaginal bleeding.   This information is not intended to replace advice given to you by your health care provider. Make sure you discuss any questions you have with your health care provider.   Document Released: 06/22/2008 Document Revised: 07/31/2014 Document Reviewed: 06/16/2013 Elsevier Interactive Patient Education [employer]2016 Elsevier Inc.

## 2016-01-13 NOTE — Progress Notes (Signed)
Patient ID: Pamela Ford, female   DOB: Jul 21, 1986, 30 y.o.   MRN: [id number] History of Present Illness: Pamela Ford is a 30 year old black female, married in for a well woman gyn exam and pap.She [facility]. PCP is Dr Pamela Ford.   Current Medications, Allergies, Past Medical History, Past Surgical History, Family History and Social History were reviewed in [facility] electronic medical record.     Review of Systems: Patient denies any headaches, hearing loss, fatigue, blurred vision, shortness of breath, chest pain, abdominal pain, problems with bowel movements, urination, or intercourse. No joint pain or mood swings.She stopped her OCs and is ok if gets pregnant.    Physical Exam:BP 130/88 mmHg  Pulse 72  Ht 5\' 3"  (1.6 m)  Wt 239 lb (108.41 kg)  BMI 42.35 kg/m2  LMP 12/26/2015 General:  Well developed, well nourished, no acute distress Skin:  Warm and dry Neck:  Midline trachea, normal thyroid, good ROM, no lymphadenopathy Lungs; Clear to auscultation bilaterally Breast:  No dominant palpable mass, retraction, or nipple discharge Cardiovascular: Regular rate and rhythm Abdomen:  Soft, non tender, no hepatosplenomegaly,obese Pelvic:  External genitalia is normal in appearance, no lesions.  The vagina is normal in appearance. Urethra has no lesions or masses. The cervix is smooth, pap with GC/CHL performed.  Uterus is felt to be normal size, non tender, difficult secondary to abdominal girth.  No adnexal masses or tenderness noted.Bladder is non tender, no masses felt. Extremities/musculoskeletal:  No swelling or varicosities noted, no clubbing or cyanosis Psych:  No mood changes, alert and cooperative,seems happy   Impression: Well woman gyn exam and pap Obesity     Plan: Rx prenatal plus #30 take 1 daily with 12 refills Decrease sodas and carbs,try to eat healthier  Physical in 1 year, pap in 3 if normal Labs with PCP Review preparing for  pregnancy

## 2016-01-14 LAB — CYTOLOGY - PAP

## 2016-02-07 ENCOUNTER — Telehealth: Payer: Self-pay | Admitting: Internal Medicine

## 2016-02-07 NOTE — Telephone Encounter (Signed)
Pt is taking dexilant. She has tried all ppi's and had diarrhea.

## 2016-02-07 NOTE — Telephone Encounter (Signed)
Pt has questions about the medicine she is on. She said it's working, but wants to know is there something she can take where she will not go to bathroom as much. Please advise and call [phone]

## 2016-02-07 NOTE — Telephone Encounter (Signed)
She has tried ALL the PPIs and they either didn't work or caused diarrhea.  Options limited. She could add something for the diarrhea, like imodium 2mg  1-2 times daily OR we can stop the Dexilant and try something like Zantac 150mg  BID.

## 2016-02-08 MED ORDER — RANITIDINE HCL 150 MG PO CAPS: 150.0000 mg | ORAL_CAPSULE | Freq: Two times a day (BID) | ORAL | Status: DC

## 2016-02-08 NOTE — Addendum Note (Signed)
Addended by: Myra RudeLAWSON, JULIE H on: 02/08/2016 12:38 PM   Modules accepted: Orders

## 2016-02-08 NOTE — Telephone Encounter (Signed)
Pt is aware. She would like to try the zantac, she wants rx for zantac sent in to see if her insurance will pay for it before she buys it otc. I have sent in rx. Pt would also like to know if we can send in rx for imodium for her occasional diarrhea. I advised her that it was available otc too but she said she would like to see if her insurance would pay for that as well. I told her that I would check with [facility] about the imodium but she is aware that her insurance may not pay for either of these.

## 2016-02-10 MED ORDER — LOPERAMIDE HCL 2 MG PO TABS: 2.0000 mg | ORAL_TABLET | Freq: Two times a day (BID) | ORAL | Status: DC | PRN

## 2016-02-10 NOTE — Addendum Note (Signed)
Addended by: Tiffany KocherLEWIS, Senia Even S on: 02/10/2016 04:05 PM   Modules accepted: Orders

## 2016-02-10 NOTE — Telephone Encounter (Signed)
rx sent for imodium

## 2016-03-13 ENCOUNTER — Telehealth: Payer: Self-pay | Admitting: Obstetrics & Gynecology

## 2016-03-13 NOTE — Telephone Encounter (Signed)
Spoke with pt. Pt started period today. She is passing blood clots and has never done that before on day 1 of period. Pt is having some pain. Pt wants to wait until Friday to see if this stops. I advised pt to call us back if she feels like she needs to be seen. Pt voiced understanding. JSY

## 2016-03-14 ENCOUNTER — Telehealth: Payer: Self-pay | Admitting: Adult Health

## 2016-03-14 MED ORDER — PROMETHAZINE HCL 25 MG PO TABS: 25.0000 mg | ORAL_TABLET | Freq: Four times a day (QID) | ORAL | 1 refills | Status: DC | PRN

## 2016-03-14 NOTE — Telephone Encounter (Signed)
Spoke with pt. Pt has nausea and vomiting with period. Pt is requesting something to help with this. Please advise. Thanks!! JSY

## 2016-03-14 NOTE — Telephone Encounter (Signed)
She is on her period and is having some nausea and pain, will rx phenergan and use tylenol,she wants to get pregnant

## 2016-03-15 ENCOUNTER — Telehealth: Payer: Self-pay | Admitting: Adult Health

## 2016-03-15 NOTE — Telephone Encounter (Signed)
Spoke with pt letting her know to try OTC Monistat. Pt voiced understanding. JSY

## 2016-03-15 NOTE — Telephone Encounter (Signed)
Spoke with pt. Pt states she has a yeast infection and wants some cream and pills ordered. Please advise. Thanks!! JSY

## 2016-03-20 ENCOUNTER — Ambulatory Visit: Payer: Medicare Other | Admitting: Women's Health

## 2016-05-31 ENCOUNTER — Ambulatory Visit: Payer: Medicare Other | Admitting: Gastroenterology

## 2016-05-31 ENCOUNTER — Encounter: Payer: Self-pay | Admitting: Gastroenterology

## 2016-07-10 ENCOUNTER — Encounter (HOSPITAL_COMMUNITY): Payer: Self-pay | Admitting: Emergency Medicine

## 2016-07-10 ENCOUNTER — Telehealth: Payer: Self-pay | Admitting: *Deleted

## 2016-07-10 ENCOUNTER — Emergency Department (HOSPITAL_COMMUNITY)
Admission: EM | Admit: 2016-07-10 | Discharge: 2016-07-10 | Disposition: A | Discharge status: Home / Self Care | Payer: Medicare Other | Attending: Emergency Medicine | Admitting: Emergency Medicine

## 2016-07-10 DIAGNOSIS — I10 Essential (primary) hypertension: Secondary | ICD-10-CM | POA: Diagnosis not present

## 2016-07-10 DIAGNOSIS — Z87891 Personal history of nicotine dependence: Secondary | ICD-10-CM | POA: Insufficient documentation

## 2016-07-10 DIAGNOSIS — Z79899 Other long term (current) drug therapy: Secondary | ICD-10-CM | POA: Diagnosis not present

## 2016-07-10 DIAGNOSIS — N644 Mastodynia: Secondary | ICD-10-CM | POA: Diagnosis not present

## 2016-07-10 DIAGNOSIS — N6452 Nipple discharge: Secondary | ICD-10-CM | POA: Diagnosis not present

## 2016-07-10 LAB — POC URINE PREG, ED: PREG TEST UR: NEGATIVE

## 2016-07-10 NOTE — Telephone Encounter (Signed)
Spoke with pt. Pt has breast tenderness and discharge from nipple. It started Saturday. I advised she needs to be seen. Pt voiced understanding and call was transferred to front desk for appt. JSY

## 2016-07-10 NOTE — ED Triage Notes (Signed)
Right breast tenderness, with white discharge

## 2016-07-10 NOTE — ED Provider Notes (Signed)
[facility]-EM[facility]Y DEPT Provider Note   CSN: [id number] Arrival date & time: 07/10/16  1158 By signing my name below, I, Pamela Ford, attest that this documentation has been prepared under the direction and in the presence of Ok EdwardsLeslie Karen Rucker [place], [place]-C . Electronically Signed: Linus GalasMaharshi Ford, ED Scribe. 07/10/16. 12:44 PM.  History   Chief Complaint Chief Complaint  Patient presents with  . Breast Discharge   The history is provided by the patient. No language interpreter was used.   HPI Comments: Pamela Ford is a 30 y.o. female who presents to the Emergency Department with a PMHx of DM and HTN complaining of bilateral breast pain and swelling with associated white discharge from the right nipple that began 2 days ago. Pt denies having these symptoms before. No aggravating or alleviating factors. No treatments tried. Pt denies any fevers, chills, abdominal pain, N/V/D, or any other symptoms at this time. Pt denies any bumps, masses, or hormonal changes. LNMP 06/26/16. G2P1A1 and has breast feed her child. Pt smokes 1 pack of cigarettes over 3 days at a time.  Past Medical History:  Diagnosis Date  . Acid reflux   . Anxiety   . BV (bacterial vaginosis) 07/06/2014  . Depression   . 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 Dysmenorrhea 03/31/2014  . Hypertension   . Impacted tooth 09/2013   impacted wisdom teeth  . Irregular periods 09/03/2014  . Leg pain, right    unknown cause, is being evaluated  . Obesity   . UTI (lower urinary tract infection) 09/03/2014  . Vaginal discharge 07/06/2014  . Yeast infection 09/21/2014   Patient Active Problem List   Diagnosis Date Noted  . Nausea without vomiting 11/29/2015  . Gastroesophageal reflux disease without esophagitis   . Yeast infection 09/21/2014  . Burning with urination 09/21/2014  . UTI (lower urinary tract infection) 09/03/2014  . Irregular periods 09/03/2014  . Vaginal discharge 07/06/2014  . BV (bacterial  vaginosis) 07/06/2014  . Dysmenorrhea 03/31/2014  . Anterior knee pain 01/20/2014  . Spontaneous abortion in first trimester 11/05/2013  . Trichomonas infection 11/05/2013  . 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 Stiffness of joint, not elsewhere classified, lower leg 10/07/2013  . Pain in joint, lower leg 10/07/2013  . Knee pain 08/21/2013  . Derangement of posterior horn of medial meniscus 08/21/2013  . OBESITY 09/16/2009  . GERD 09/16/2009  . Morbid obesity (HCC) 05/29/2008  . NICOTINE ADDICTION 05/29/2008   Past Surgical History:  Procedure Laterality Date  . ESOPHAGOGASTRODUODENOSCOPY N/A 06/23/2015   RMR: Retained gastric contents. Not all the stomach seen, however, upper GI tract that was seen appeared normal. Patient reports las intake of solid food 14 hours prior to this procedure. Therefore, Patient likely has an element of delayed gasric emptying. this could be mediction effect and possibly a result underlying diabetic gastroparesis. Intoleratnt to multiple PPI's. Most recently pantoprozo  . NO PAST SURGERIES    . TOOTH EXTRACTION N/A 10/06/2013   Procedure: EXTRACTION THIRD MOLARS;  Surgeon: Georgia LopesScott M Jensen, DDS;  Location: [facility];  Service: Oral Surgery;  Laterality: N/A;   OB History    Gravida Para Term Preterm AB Living   2 1 0 1 1 1    SAB TAB Ectopic Multiple Live Births   1 0 0 0 1     Home Medications    Prior to Admission medications   Medication Sig Start Date End Date Taking? Authorizing Provider  busPIRone (BUSPAR) 15  MG tablet Take 15 mg by mouth daily.  09/03/15   Historical Provider, MD  dexlansoprazole (DEXILANT) 60 MG capsule Take 1 capsule (60 mg total) by mouth daily. 01/12/16   Tiffany KocherLeslie S Lewis, PA-C  loperamide (IMODIUM A-D) 2 MG tablet Take 1 tablet (2 mg total) by mouth 2 (two) times daily as needed for diarrhea or loose stools. 02/10/16   Tiffany KocherLeslie S Lewis, PA-C  naproxen sodium (ANAPROX) 550 MG tablet Take 1 tablet (550 mg total) by mouth 2  (two) times daily with a meal. 12/21/15   Lazaro ArmsLuther H Eure, MD  norgestimate-ethinyl estradiol (ORTHO-CYCLEN,SPRINTEC,PREVIFEM) 0.25-35 MG-MCG tablet Take 1 tablet by mouth daily. Patient not taking: Reported on 01/13/2016 11/04/15   Lazaro ArmsLuther H Eure, MD  ondansetron (ZOFRAN) 4 MG tablet Take 1 tablet (4 mg total) by mouth every 8 (eight) hours as needed for nausea or vomiting. 12/21/15   Lazaro ArmsLuther H Eure, MD  pantoprazole (PROTONIX) 40 MG tablet Take 1 tablet (40 mg total) by mouth daily before breakfast. 11/29/15   Tiffany KocherLeslie S Lewis, PA-C  PARoxetine (PAXIL) 40 MG tablet Take 40 mg by mouth daily.  03/27/14   Historical Provider, MD  prenatal vitamin w/FE, FA (PRENATAL 1 + 1) 27-1 MG TABS tablet Take 1 tablet by mouth daily at 12 noon. 01/13/16   Adline PotterJennifer A Griffin, NP  promethazine (PHENERGAN) 25 MG tablet Take 1 tablet (25 mg total) by mouth every 6 (six) hours as needed for nausea or vomiting. 03/14/16   Adline PotterJennifer A Griffin, NP  ranitidine (ZANTAC) 150 MG capsule Take 1 capsule (150 mg total) by mouth 2 (two) times daily. 02/08/16   Tiffany KocherLeslie S Lewis, PA-C   Family History Family History  Problem Relation Age of Onset  . Diabetes Mother   . Hypertension Mother   . Diabetes Father   . Preterm labor Son     preterm delivery at 35 weeks  . Kidney disease Maternal Grandmother     was on dialysis  . Heart disease Maternal Grandmother   . Heart attack Maternal Grandfather   . Heart attack Paternal Grandfather   . Cancer Maternal Aunt   . Cancer Paternal Aunt   . Colon cancer Neg Hx    Social History Social History  Substance Use Topics  . Smoking status: Former Smoker    Packs/day: 0.03    Years: 5.00    Types: Cigarettes    Quit date: 09/29/2015  . Smokeless tobacco: Never Used     Comment: 3 cig./day  . Alcohol use No   Allergies   Patient has no known allergies.  Review of Systems Review of Systems  Constitutional: Negative for chills and fever.  Gastrointestinal: Negative for abdominal pain,  nausea and vomiting.  All other systems reviewed and are negative.  Physical Exam Updated Vital Signs BP 127/95   Pulse 77   Temp 98 F (36.7 C) (Oral)   Resp 18   Ht 5\' 3"  (1.6 m)   Wt 236 lb (107 kg)   LMP 06/26/2016   SpO2 100%   BMI 41.81 kg/m   Physical Exam  Constitutional: She is oriented to person, place, and time. She appears well-developed and well-nourished.  HENT:  Head: Normocephalic.  Eyes: EOM are normal.  Neck: Normal range of motion.  Cardiovascular: Normal rate, regular rhythm and normal heart sounds.  Exam reveals no gallop and no friction rub.   No murmur heard. Pulmonary/Chest: Effort normal and breath sounds normal. No respiratory distress. She has no wheezes.  She has no rales. She exhibits no tenderness.  Abdominal: She exhibits no distension.  Musculoskeletal: Normal range of motion.  Neurological: She is alert and oriented to person, place, and time.  Psychiatric: She has a normal mood and affect.  Nursing note and vitals reviewed. bilat inverted nipples,  No masses.  No current drainage ED Treatments / Results  DIAGNOSTIC STUDIES: Oxygen Saturation is 100% on room air, normal by my interpretation.    COORDINATION OF CARE: 12:44 PM Discussed treatment plan with pt at bedside and pt agreed to plan.  Labs (all labs ordered are listed, but only abnormal results are displayed) Labs Reviewed - No data to display  EKG  EKG Interpretation None       Radiology No results found.  Procedures Procedures (including critical care time)  Medications Ordered in ED Medications - No data to display   Initial Impression / Assessment and Plan / ED Course  I have reviewed the triage vital signs and the nursing notes.  Pertinent labs & imaging results that were available during my care of the patient were reviewed by me and considered in my medical decision making (see chart for details).  Clinical Course     Urine preg is negative.  Pt advised  to follow up here tomorrow for ultrasound   Final Clinical Impressions(s) / ED Diagnoses   Final diagnoses:  Painful breasts  Nipple discharge    New Prescriptions New Prescriptions   No medications on file    I personally performed the services in this documentation, which was scribed in my presence.  The recorded information has been reviewed and considered.   Barnet Pall.   Lonia Skinner [place], PA-C 07/10/16 1407    Blane Ohara, MD 07/11/16 [id number]

## 2016-07-10 NOTE — Discharge Instructions (Signed)
Return here tomorrow for ultrasound °

## 2016-07-11 ENCOUNTER — Other Ambulatory Visit (HOSPITAL_COMMUNITY): Payer: Self-pay | Admitting: Emergency Medicine

## 2016-07-11 ENCOUNTER — Ambulatory Visit (INDEPENDENT_AMBULATORY_CARE_PROVIDER_SITE_OTHER): Payer: Medicare Other | Admitting: Gastroenterology

## 2016-07-11 ENCOUNTER — Encounter: Payer: Self-pay | Admitting: Gastroenterology

## 2016-07-11 ENCOUNTER — Ambulatory Visit (HOSPITAL_COMMUNITY): Admit: 2016-07-11 | Payer: Medicare Other

## 2016-07-11 ENCOUNTER — Ambulatory Visit (HOSPITAL_COMMUNITY)
Admission: RE | Admit: 2016-07-11 | Discharge: 2016-07-11 | Disposition: A | Discharge status: Home / Self Care | Payer: Medicare Other | Source: Ambulatory Visit | Attending: Emergency Medicine | Admitting: Emergency Medicine

## 2016-07-11 VITALS — BP 117/76 | HR 77 | Temp 97.7°F | Ht 63.0 in | Wt 235.6 lb

## 2016-07-11 DIAGNOSIS — N6452 Nipple discharge: Secondary | ICD-10-CM

## 2016-07-11 DIAGNOSIS — K219 Gastro-esophageal reflux disease without esophagitis: Secondary | ICD-10-CM | POA: Diagnosis not present

## 2016-07-11 DIAGNOSIS — N6489 Other specified disorders of breast: Secondary | ICD-10-CM | POA: Diagnosis not present

## 2016-07-11 NOTE — Patient Instructions (Signed)
1. Try going down to zantac just in the mornings for 1-2 weeks. If tolerated, then you can drop down to take zantac just when needed. Resume if you have recurrent heartburn issues.  2. See you back in 2 years or sooner if needed.

## 2016-07-11 NOTE — Progress Notes (Signed)
cc'ed to pcp °

## 2016-07-11 NOTE — Assessment & Plan Note (Signed)
Doing well on Zantac 150 mg twice a day. She has made significant dietary changes. Also has stopped drinking soft drinks, had been consuming 2 L per day. She has lost some weight with this. She can continue current regimen for now. When she feels like she's been without significant upper GI symptoms for several months, she can try weaning off of Zantac initially down to once daily dosing. Goal of taking as needed only. May resume if recurrent symptoms. Return to the office in 2 years or call sooner if needed.

## 2016-07-11 NOTE — Progress Notes (Signed)
Primary Care Physician: Avon GullyFANTA,TESFAYE, MD  Primary Gastroenterologist:  Roetta SessionsMichael Rourk, MD   Chief Complaint  Patient presents with  . Gastroesophageal Reflux    better since taking Zantac    HPI: Pamela Ford is a 30 y.o. female hereFor follow-up. She has a history of GERD. Previously failed omeprazole and Prevacid. She developed diarrhea with Nexium, AcipHex, pantoprazole. She was unable to get Dexilant. Ultimately she was switched to Zantac. She also made significant dietary changes. She's dropped over 10 pounds in the past 6 months, 20 pounds in the past one year. She stop drinking 2 L of soft drink every day. Drinking a lot more water. Heartburn is well controlled. Also came off of a lot of medications which may have helped. Denies abdominal pain, dysphagia, vomiting, constipation, diarrhea, melena, rectal bleeding.   Current Outpatient Prescriptions  Medication Sig Dispense Refill  . naproxen sodium (ANAPROX) 550 MG tablet Take 1 tablet (550 mg total) by mouth 2 (two) times daily with a meal. 60 tablet 1  . ondansetron (ZOFRAN) 4 MG tablet Take 1 tablet (4 mg total) by mouth every 8 (eight) hours as needed for nausea or vomiting. 12 tablet 0  . promethazine (PHENERGAN) 25 MG tablet Take 1 tablet (25 mg total) by mouth every 6 (six) hours as needed for nausea or vomiting. 30 tablet 1  . ranitidine (ZANTAC) 150 MG capsule Take 1 capsule (150 mg total) by mouth 2 (two) times daily. 60 capsule 1   No current facility-administered medications for this visit.     Allergies as of 07/11/2016  . (No Known Allergies)   Past Surgical History:  Procedure Laterality Date  . ESOPHAGOGASTRODUODENOSCOPY N/A 06/23/2015   RMR: Retained gastric contents. Not all the stomach seen, however, upper GI tract that was seen appeared normal. Patient reports las intake of solid food 14 hours prior to this procedure. Therefore, Patient likely has an element of delayed gasric emptying. this could be  mediction effect and possibly a result underlying diabetic gastroparesis. Intoleratnt to multiple PPI's. Most recently pantoprozo  . NO PAST SURGERIES    . TOOTH EXTRACTION N/A 10/06/2013   Procedure: EXTRACTION THIRD MOLARS;  Surgeon: Georgia LopesScott M Jensen, DDS;  Location: [facility];  Service: Oral Surgery;  Laterality: N/A;    ROS:  General: Negative for anorexia, weight loss, fever, chills, fatigue, weakness. ENT: Negative for hoarseness, difficulty swallowing , nasal congestion. CV: Negative for chest pain, angina, palpitations, dyspnea on exertion, peripheral edema.  Respiratory: Negative for dyspnea at rest, dyspnea on exertion, cough, sputum, wheezing.  GI: See history of present illness. GU:  Negative for dysuria, hematuria, urinary incontinence, urinary frequency, nocturnal urination.  Endo: Negative for unusual weight change.    Physical Examination:   BP 117/76   Pulse 77   Temp 97.7 F (36.5 C) (Oral)   Ht 5\' 3"  (1.6 m)   Wt 235 lb 9.6 oz (106.9 kg)   LMP 06/26/2016   BMI 41.73 kg/m   General: Well-nourished, well-developed in no acute distress.  Eyes: No icterus. Mouth: Oropharyngeal mucosa moist and pink , no lesions erythema or exudate. Lungs: Clear to auscultation bilaterally.  Heart: Regular rate and rhythm, no murmurs rubs or gallops.  Abdomen: Bowel sounds are normal, nontender, nondistended, no hepatosplenomegaly or masses, no abdominal bruits or hernia , no rebound or guarding.   Extremities: No lower extremity edema. No clubbing or deformities. Neuro: Alert and oriented x 4   Skin: Warm and  dry, no jaundice.   Psych: Alert and cooperative, normal mood and affect.

## 2016-07-19 ENCOUNTER — Ambulatory Visit: Payer: Medicare Other | Admitting: Adult Health

## 2016-07-25 ENCOUNTER — Ambulatory Visit: Payer: Medicare Other | Admitting: Adult Health

## 2016-08-01 ENCOUNTER — Ambulatory Visit: Payer: Medicare Other | Admitting: Adult Health

## 2016-08-03 DIAGNOSIS — Z0001 Encounter for general adult medical examination with abnormal findings: Secondary | ICD-10-CM | POA: Diagnosis not present

## 2016-08-07 ENCOUNTER — Ambulatory Visit: Payer: Medicare Other | Admitting: Adult Health

## 2016-08-08 ENCOUNTER — Encounter: Payer: Self-pay | Admitting: Adult Health

## 2016-08-08 ENCOUNTER — Ambulatory Visit: Payer: Medicare Other | Admitting: Adult Health

## 2016-08-08 ENCOUNTER — Ambulatory Visit (INDEPENDENT_AMBULATORY_CARE_PROVIDER_SITE_OTHER): Payer: Medicare Other | Admitting: Adult Health

## 2016-08-08 VITALS — BP 119/58 | HR 81 | Ht 63.0 in | Wt 233.5 lb

## 2016-08-08 DIAGNOSIS — R110 Nausea: Secondary | ICD-10-CM | POA: Diagnosis not present

## 2016-08-08 DIAGNOSIS — N946 Dysmenorrhea, unspecified: Secondary | ICD-10-CM

## 2016-08-08 MED ORDER — NAPROXEN SODIUM 550 MG PO TABS: 550.0000 mg | ORAL_TABLET | Freq: Two times a day (BID) | ORAL | 1 refills | Status: DC

## 2016-08-08 MED ORDER — ONDANSETRON HCL 4 MG PO TABS: 4.0000 mg | ORAL_TABLET | Freq: Three times a day (TID) | ORAL | 0 refills | Status: DC | PRN

## 2016-08-08 NOTE — Progress Notes (Signed)
Subjective:     Patient ID: Pamela Ford, female   DOB: Sep 18, 1985, 31 y.o.   MRN: [id number]  HPI Alcario Droughtanika is a 31 year old black female in to get refills on anaprox and complains of period cramps and has nausea and occasional diarrhea with her period.Periods are regular now.  Review of Systems +period cramps +nausea with period, and occasional diarrhea Reviewed past medical,surgical, social and family history. Reviewed medications and allergies.     Objective:   Physical Exam BP (!) 119/58 (BP Location: Left Arm, Patient Position: Sitting, Cuff Size: Large)   Pulse 81   Ht 5\' 3"  (1.6 m)   Wt 233 lb 8 oz (105.9 kg)   LMP 07/23/2016 (Approximate)   BMI 41.36 kg/m   PHQ 2 score 0. Skin warm and dry. Lungs: clear to ausculation bilaterally. Cardiovascular: regular rate and rhythm.Discussed that during period stoll can be looser due to more water drain to that area.   Will refill anaprox and will try zofran.  Assessment:     1. Dysmenorrhea   2. Nausea       Plan:     Meds ordered this encounter  Medications  . naproxen sodium (ANAPROX) 550 MG tablet    Sig: Take 1 tablet (550 mg total) by mouth 2 (two) times daily with a meal.    Dispense:  60 tablet    Refill:  1    Order Specific Question:   Supervising Provider    Answer:   Despina HiddenEURE, LUTHER H [[id number]]  . ondansetron (ZOFRAN) 4 MG tablet    Sig: Take 1 tablet (4 mg total) by mouth every 8 (eight) hours as needed for nausea or vomiting.    Dispense:  20 tablet    Refill:  0    Order Specific Question:   Supervising Provider    Answer:   Duane LopeEURE, LUTHER H [[id number]]  Follow up in 6 months for physical

## 2016-09-30 IMAGING — NM NM GASTRIC EMPTYING
8 series · 8 of 8 positions shown · non-contrast
Comparison: None

CLINICAL DATA: One year history of nausea, vomiting, abdominal
pain, bloating and reflux, history diabetes mellitus and
hypertension

EXAM:
NUCLEAR MEDICINE GASTRIC EMPTYING SCAN
TECHNIQUE: After oral ingestion of radiolabeled meal, sequential abdominal
images were obtained for 3 hours. Percentage of activity emptying
the stomach was calculated at 1 hour, 2 hour, and 3 hours. Imaging
was not carried [DATE] hours due to normal findings detected by 3
hours.
RADIOPHARMACEUTICALS:  2 mCi Jc-SSm sulfur colloid labeled egg
whites orally

[Series 1: 0 min · 4.14mm/px · 1 of 1 slices shown (1 of 2)]
[im 1/1]
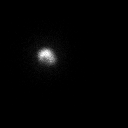

[Series 1: 0 min · 4.14mm/px · 1 of 1 slices shown (2 of 2)]
[im 1/1]
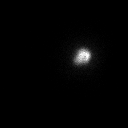

[Series 1: 120 min · 4.14mm/px · 1 of 1 slices shown (1 of 4)]
[im 1/1]
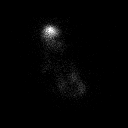

[Series 1: 120 min · 4.14mm/px · 1 of 1 slices shown (2 of 4)]
[im 1/1]
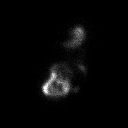

[Series 2: 60 min · 4.14mm/px · 1 of 1 slices shown (1 of 2)]
[im 1/1]
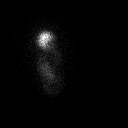

[Series 2: 60 min · 4.14mm/px · 1 of 1 slices shown (2 of 2)]
[im 1/1]
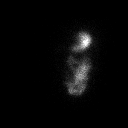

[Series 2: 120 min · 4.14mm/px · 1 of 1 slices shown (3 of 4)]
[im 1/1  full-range]
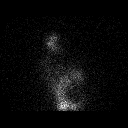

[Series 2: 120 min · 4.14mm/px · 1 of 1 slices shown (4 of 4)]
[im 1/1]
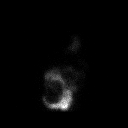

[8 of 8 positions shown; findings below may reference images not displayed]

FINDINGS: Expected location of the stomach in the left upper quadrant.
Ingested meal empties the stomach gradually over the course of the
study.

50% emptied at 1 hr ( normal >= 10%)

69% emptied at 2 hr ( normal >= 40%)

91% emptied at 3 hr ( normal >= 70%)

N/A emptied at 4 hr ( normal >= 90%)
IMPRESSION: Normal gastric emptying study.

## 2016-10-12 ENCOUNTER — Other Ambulatory Visit: Payer: Self-pay | Admitting: *Deleted

## 2016-10-12 ENCOUNTER — Telehealth: Payer: Self-pay | Admitting: Adult Health

## 2016-10-12 MED ORDER — ONDANSETRON HCL 4 MG PO TABS: 4.0000 mg | ORAL_TABLET | Freq: Three times a day (TID) | ORAL | 0 refills | Status: DC | PRN

## 2016-10-12 NOTE — Telephone Encounter (Signed)
Pt called stating that she needs a refill of her medication  ondansetron (ZOFRAN) 4 MG tablet Pt states she uses [facility] in [place]. Please contact pt

## 2016-11-08 NOTE — Telephone Encounter (Signed)
Wireless caller unavailable at 5:26 pm. JSY

## 2016-11-08 NOTE — Telephone Encounter (Signed)
Pharmacy states that Naproxen is now on non preferred list. However, Ibuprofen and Meloxicam are on preferred list. Please advise.

## 2016-11-09 ENCOUNTER — Telehealth: Payer: Self-pay | Admitting: Obstetrics & Gynecology

## 2016-11-09 NOTE — Telephone Encounter (Signed)
Pt states that she has called her pharmacy to get a refill of her medication, the pharmacy states that they have been to contact us because the medication is not covered under her insurance anymore. Pt states that she would like to know what is going on because the medication was covered before. Please contact pt

## 2016-11-09 NOTE — Telephone Encounter (Signed)
Spoke with pharmacist who states naproxen  is on preferred list. Verbal order given per Selena Batten, CNM for naproxen sodium .

## 2016-12-26 ENCOUNTER — Ambulatory Visit: Payer: Medicare Other | Admitting: Adult Health

## 2016-12-26 ENCOUNTER — Encounter: Payer: Self-pay | Admitting: Adult Health

## 2017-01-18 ENCOUNTER — Ambulatory Visit (INDEPENDENT_AMBULATORY_CARE_PROVIDER_SITE_OTHER): Payer: Medicare Other | Admitting: Advanced Practice Midwife

## 2017-01-18 ENCOUNTER — Encounter: Payer: Self-pay | Admitting: Advanced Practice Midwife

## 2017-01-18 ENCOUNTER — Other Ambulatory Visit: Payer: Self-pay | Admitting: Advanced Practice Midwife

## 2017-01-18 VITALS — BP 100/60 | HR 80 | Wt 211.0 lb

## 2017-01-18 DIAGNOSIS — N72 Inflammatory disease of cervix uteri: Secondary | ICD-10-CM | POA: Diagnosis not present

## 2017-01-18 DIAGNOSIS — N898 Other specified noninflammatory disorders of vagina: Secondary | ICD-10-CM

## 2017-01-18 MED ORDER — METRONIDAZOLE 0.75 % VA GEL: 1.0000 | Freq: Every day | VAGINAL | 0 refills | Status: DC

## 2017-01-18 NOTE — Progress Notes (Signed)
[facility] Visit  Patient name: Pamela Ford MRN [id number]  Date of birth: [birth year]  CC & HPI:  Pamela Ford is a 31 y.o. African American female presenting today for spotting for 3 days. Started off red, now darker.  Denies vaginal irritation, itch or abnormal discharge. Not on [facility]  OK if gets pregnant.   Pertinent History Reviewed:  Medical & Surgical Hx:   Past Medical History:  Diagnosis Date  . Acid reflux   . Anxiety   . BV (bacterial vaginosis) 07/06/2014  . Depression   . 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 Dysmenorrhea 03/31/2014  . Hypertension   . Impacted tooth 09/2013   impacted wisdom teeth  . Irregular periods 09/03/2014  . Leg pain, right    unknown cause, is being evaluated  . Obesity   . UTI (lower urinary tract infection) 09/03/2014  . Vaginal discharge 07/06/2014  . Yeast infection 09/21/2014   Past Surgical History:  Procedure Laterality Date  . ESOPHAGOGASTRODUODENOSCOPY N/A 06/23/2015   RMR: Retained gastric contents. Not all the stomach seen, however, upper GI tract that was seen appeared normal. Patient reports las intake of solid food 14 hours prior to this procedure. Therefore, Patient likely has an element of delayed gasric emptying. this could be mediction effect and possibly a result underlying diabetic gastroparesis. Intoleratnt to multiple PPI's. Most recently pantoprozo  . NO PAST SURGERIES    . TOOTH EXTRACTION N/A 10/06/2013   Procedure: EXTRACTION THIRD MOLARS;  Surgeon: Georgia Lopes, DDS;  Location: [facility];  Service: Oral Surgery;  Laterality: N/A;   Family History  Problem Relation Age of Onset  . Diabetes Mother   . Hypertension Mother   . Diabetes Father   . Preterm labor Son        preterm delivery at 35 weeks  . Kidney disease Maternal Grandmother        was on dialysis  . Heart disease Maternal Grandmother   . Heart attack Maternal Grandfather   . Heart attack Paternal  Grandfather   . Cancer Maternal Aunt   . Cancer Paternal Aunt   . Colon cancer Neg Hx     Current Outpatient Prescriptions:  .  naproxen sodium (ANAPROX) 550 MG tablet, Take 1 tablet (550 mg total) by mouth 2 (two) times daily with a meal., Disp: 60 tablet, Rfl: 1 .  ranitidine (ZANTAC) 150 MG capsule, Take 1 capsule (150 mg total) by mouth 2 (two) times daily., Disp: 60 capsule, Rfl: 1 .  metroNIDAZOLE (METROGEL VAGINAL) 0.75 % vaginal gel, Place 1 Applicatorful vaginally at bedtime. For 5 nights, Disp: 70 g, Rfl: 0 Social History: Reviewed -  reports that she has been smoking Cigarettes.  She has smoked for the past 5.00 years. She has never used smokeless tobacco.  Review of Systems:   Constitutional: Negative for fever and chills Eyes: Negative for visual disturbances Respiratory: Negative for shortness of breath, dyspnea Cardiovascular: Negative for chest pain or palpitations  Gastrointestinal: Negative for vomiting, diarrhea and constipation; no abdominal pain Genitourinary: Negative for dysuria and urgency, vaginal irritation or itching Musculoskeletal: Negative for back pain, joint pain, myalgias  Neurological: Negative for dizziness and headaches    Objective Findings:    Physical Examination: General appearance - well appearing, and in no distress Mental status - alert, oriented to person, place, and time Chest:  Normal respiratory effort Heart - normal rate and regular rhythm Abdomen:  Soft,  nontender Pelvic: SSE: vagina appears normal. Small amount of dark blood, cx somewhat friable. Wet prep neg Musculoskeletal:  Normal range of motion without pain Extremities:  No edema    No results found for this or any previous visit (from the past 24 hour(s)).    Assessment & Plan:  A:   Cervicitis P:  GC/CHL.  Treat w/metrogel   Return for If you have any problems.  CRESENZO-DISHMAN,Mckaila Duffus CNM 01/18/2017 10:51 AM

## 2017-01-19 ENCOUNTER — Encounter (HOSPITAL_COMMUNITY): Payer: Self-pay | Admitting: Emergency Medicine

## 2017-01-19 ENCOUNTER — Emergency Department (HOSPITAL_COMMUNITY)
Admission: EM | Admit: 2017-01-19 | Discharge: 2017-01-19 | Disposition: A | Discharge status: Home / Self Care | Payer: Medicare Other | Attending: Emergency Medicine | Admitting: Emergency Medicine

## 2017-01-19 DIAGNOSIS — I10 Essential (primary) hypertension: Secondary | ICD-10-CM | POA: Insufficient documentation

## 2017-01-19 DIAGNOSIS — Z79899 Other long term (current) drug therapy: Secondary | ICD-10-CM | POA: Insufficient documentation

## 2017-01-19 DIAGNOSIS — N760 Acute vaginitis: Secondary | ICD-10-CM | POA: Insufficient documentation

## 2017-01-19 DIAGNOSIS — E119 Type 2 diabetes mellitus without complications: Secondary | ICD-10-CM | POA: Insufficient documentation

## 2017-01-19 DIAGNOSIS — F17210 Nicotine dependence, cigarettes, uncomplicated: Secondary | ICD-10-CM | POA: Insufficient documentation

## 2017-01-19 DIAGNOSIS — B9689 Other specified bacterial agents as the cause of diseases classified elsewhere: Secondary | ICD-10-CM

## 2017-01-19 DIAGNOSIS — R102 Pelvic and perineal pain: Secondary | ICD-10-CM

## 2017-01-19 LAB — URINALYSIS, ROUTINE W REFLEX MICROSCOPIC
Bilirubin Urine: NEGATIVE
GLUCOSE, UA: NEGATIVE mg/dL
KETONES UR: NEGATIVE mg/dL
Nitrite: NEGATIVE
PROTEIN: NEGATIVE mg/dL
Specific Gravity, Urine: 1.006 (ref 1.005–1.030)
pH: 6 (ref 5.0–8.0)

## 2017-01-19 LAB — WET PREP, GENITAL
SPERM: NONE SEEN
Trich, Wet Prep: NONE SEEN
YEAST WET PREP: NONE SEEN

## 2017-01-19 LAB — PREGNANCY, URINE: Preg Test, Ur: NEGATIVE

## 2017-01-19 MED ORDER — METRONIDAZOLE 500 MG PO TABS: 500.0000 mg | ORAL_TABLET | Freq: Two times a day (BID) | ORAL | 0 refills | Status: DC

## 2017-01-19 MED ORDER — AZITHROMYCIN 250 MG PO TABS
1000.0000 mg | ORAL_TABLET | Freq: Once | ORAL | Status: AC
  Administered 2017-01-19: 1000 mg via ORAL
  Filled 2017-01-19: qty 4

## 2017-01-19 MED ORDER — KETOROLAC TROMETHAMINE 30 MG/ML IJ SOLN
30.0000 mg | Freq: Once | INTRAMUSCULAR | Status: AC
  Administered 2017-01-19: 30 mg via INTRAMUSCULAR
  Filled 2017-01-19: qty 1

## 2017-01-19 MED ORDER — CEFTRIAXONE SODIUM 250 MG IJ SOLR
250.0000 mg | Freq: Once | INTRAMUSCULAR | Status: AC
  Administered 2017-01-19: 250 mg via INTRAMUSCULAR
  Filled 2017-01-19: qty 250

## 2017-01-19 MED ORDER — LIDOCAINE HCL (PF) 1 % IJ SOLN
INTRAMUSCULAR | Status: AC
  Administered 2017-01-19: 1 mL
  Filled 2017-01-19: qty 5

## 2017-01-19 NOTE — ED Triage Notes (Signed)
PT states irregular vaginal bleeding with pain to pelvis with [facility] dx of [facility]. PT states no relief from prescriptions given to her yesterday.

## 2017-01-19 NOTE — ED Provider Notes (Signed)
[facility] DEPT Provider Note   CSN: [id number] Arrival date & time: 01/19/17  1854     History   Chief Complaint Chief Complaint  Patient presents with  . Pelvic Pain    HPI Pamela Ford is a 31 y.o. female.  HPI 31 year old female who presents with pelvic pain. Has had pelvic pain with vaginal discharge and spotting over the past 4 days. Was seen at her GYN clinic yesterday and diagnosed with cervicitis, and given MetroGel. States no relief of symptoms after day of treatment. Taking naproxen with persistent pain. Denies any nausea, vomiting, fevers or chills. Has noted some intermittent dysuria and frequency recently. No flank pain.   Past Medical History:  Diagnosis Date  . Acid reflux   . Anxiety   . BV (bacterial vaginosis) 07/06/2014  . Depression   . 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 Dysmenorrhea 03/31/2014  . Hypertension   . Impacted tooth 09/2013   impacted wisdom teeth  . Irregular periods 09/03/2014  . Leg pain, right    unknown cause, is being evaluated  . Obesity   . UTI (lower urinary tract infection) 09/03/2014  . Vaginal discharge 07/06/2014  . Yeast infection 09/21/2014    Patient Active Problem List   Diagnosis Date Noted  . Nausea 08/08/2016  . Nausea without vomiting 11/29/2015  . Gastroesophageal reflux disease without esophagitis   . Yeast infection 09/21/2014  . Burning with urination 09/21/2014  . UTI (lower urinary tract infection) 09/03/2014  . Irregular periods 09/03/2014  . Vaginal discharge 07/06/2014  . BV (bacterial vaginosis) 07/06/2014  . Dysmenorrhea 03/31/2014  . Anterior knee pain 01/20/2014  . Spontaneous abortion in first trimester 11/05/2013  . Trichomonas infection 11/05/2013  . 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 Stiffness of joint, not elsewhere classified, lower leg 10/07/2013  . Pain in joint, lower leg 10/07/2013  . Knee pain 08/21/2013  . Derangement of posterior horn of medial  meniscus 08/21/2013  . OBESITY 09/16/2009  . GERD 09/16/2009  . Morbid obesity (HCC) 05/29/2008  . NICOTINE ADDICTION 05/29/2008    Past Surgical History:  Procedure Laterality Date  . ESOPHAGOGASTRODUODENOSCOPY N/A 06/23/2015   RMR: Retained gastric contents. Not all the stomach seen, however, upper GI tract that was seen appeared normal. Patient reports las intake of solid food 14 hours prior to this procedure. Therefore, Patient likely has an element of delayed gasric emptying. this could be mediction effect and possibly a result underlying diabetic gastroparesis. Intoleratnt to multiple PPI's. Most recently pantoprozo  . NO PAST SURGERIES    . TOOTH EXTRACTION N/A 10/06/2013   Procedure: EXTRACTION THIRD MOLARS;  Surgeon: Georgia LopesScott M Jensen, DDS;  Location: [facility];  Service: Oral Surgery;  Laterality: N/A;    OB History    Gravida Para Term Preterm AB Living   2 1 0 1 1 1    SAB TAB Ectopic Multiple Live Births   1 0 0 0 1       Home Medications    Prior to Admission medications   Medication Sig Start Date End Date Taking? Authorizing Provider  metroNIDAZOLE (FLAGYL) 500 MG tablet Take 1 tablet (500 mg total) by mouth 2 (two) times daily. 01/19/17   Lavera GuiseLiu, Lauranne Beyersdorf Duo, MD  metroNIDAZOLE (METROGEL VAGINAL) 0.75 % vaginal gel Place 1 Applicatorful vaginally at bedtime. For 5 nights 01/18/17   Cresenzo-Dishmon, Scarlette CalicoFrances, CNM  naproxen sodium (ANAPROX) 550 MG tablet Take 1 tablet (550 mg total) by mouth 2 (  two) times daily with a meal. 08/08/16   Adline Potter, NP  ranitidine (ZANTAC) 150 MG capsule Take 1 capsule (150 mg total) by mouth 2 (two) times daily. 02/08/16   Tiffany Kocher, PA-C    Family History Family History  Problem Relation Age of Onset  . Diabetes Mother   . Hypertension Mother   . Diabetes Father   . Preterm labor Son        preterm delivery at 35 weeks  . Kidney disease Maternal Grandmother        was on dialysis  . Heart disease Maternal  Grandmother   . Heart attack Maternal Grandfather   . Heart attack Paternal Grandfather   . Cancer Maternal Aunt   . Cancer Paternal Aunt   . Colon cancer Neg Hx     Social History Social History  Substance Use Topics  . Smoking status: Current Every Day Smoker    Packs/day: 0.25    Years: 5.00    Types: Cigarettes  . Smokeless tobacco: Never Used     Comment: smokes 4 cig daily  . Alcohol use No     Allergies   Patient has no known allergies.   Review of Systems Review of Systems  Constitutional: Negative for fever.  Cardiovascular: Negative for chest pain.  Gastrointestinal: Negative for abdominal pain.  Genitourinary: Positive for pelvic pain.  Musculoskeletal: Negative for back pain.  All other systems reviewed and are negative.    Physical Exam Updated Vital Signs BP 119/72   Pulse 76   Temp 98.2 F (36.8 C) (Oral)   Resp 18   Ht 5\' 6"  (1.676 m)   Wt 95.7 kg (211 lb)   LMP 12/31/2016   SpO2 100%   BMI 34.06 kg/m   Physical Exam Physical Exam  Nursing note and vitals reviewed. Constitutional: Well developed, well nourished, non-toxic, and in no acute distress Head: Normocephalic and atraumatic.  Mouth/Throat: Oropharynx is clear and moist.  Neck: Normal range of motion. Neck supple.  Cardiovascular: Normal rate and regular rhythm.   Pulmonary/Chest: Effort normal and breath sounds normal.  Abdominal: Soft. There is no tenderness. There is no rebound and no guarding.  Musculoskeletal: Normal range of motion.  Neurological: Alert, no facial droop, fluent speech, moves all extremities symmetrically Skin: Skin is warm and dry.  Psychiatric: Cooperative Pelvic: Normal external genitalia. Normal internal genitalia. No discharge. Dried blood within the vagina. No cervical motion tenderness. No adnexal masses or tenderness.   ED Treatments / Results  Labs (all labs ordered are listed, but only abnormal results are displayed) Labs Reviewed  WET PREP,  GENITAL - Abnormal; Notable for the following:       Result Value   Clue Cells Wet Prep HPF POC PRESENT (*)    WBC, Wet Prep HPF POC RARE (*)    All other components within normal limits  URINALYSIS, ROUTINE W REFLEX MICROSCOPIC - Abnormal; Notable for the following:    Hgb urine dipstick LARGE (*)    Leukocytes, UA TRACE (*)    Bacteria, UA RARE (*)    Squamous Epithelial / LPF 0-5 (*)    All other components within normal limits  URINE CULTURE  PREGNANCY, URINE  GC/CHLAMYDIA PROBE AMP ([place]) NOT AT [facility]    EKG  EKG Interpretation None       Radiology No results found.  Procedures Procedures (including critical care time)  Medications Ordered in ED Medications  ketorolac (TORADOL) 30 MG/ML injection 30  mg (30 mg Intramuscular Given 01/19/17 2129)  cefTRIAXone (ROCEPHIN) injection 250 mg (250 mg Intramuscular Given 01/19/17 2150)  azithromycin (ZITHROMAX) tablet 1,000 mg (1,000 mg Oral Given 01/19/17 2151)  lidocaine (PF) (XYLOCAINE) 1 % injection (1 mL  Given 01/19/17 2151)     Initial Impression / Assessment and Plan / ED Course  I have reviewed the triage vital signs and the nursing notes.  Pertinent labs & imaging results that were available during my care of the patient were reviewed by me and considered in my medical decision making (see chart for details).     31 year old female who presents with vaginal bleeding, vaginal discharge and low pelvic pain. She is nontoxic in no acute distress normal vital signs. Her abdomen is soft and benign. Pelvic exam with scant dried blood in the vagina. No cervical motion tenderness or adnexal tenderness/masses suggest PID or TOA. Overall she has a very reassuring exam. She did request to be treated for STDs due to potential exposure month ago. Empirically given ceftriaxone and azithromycin. Positive for BV, and states that she does not know how to use the MetroGel that was prescribed to her yesterday, so we will give her  Flagyl by mouth. She is not pregnant and UA not suggestive of infection. To continue supportive care management and follow-up closely with her gynecologist. Strict return and follow-up instructions reviewed. She expressed understanding of all discharge instructions and felt comfortable with the plan of care.   Final Clinical Impressions(s) / ED Diagnoses   Final diagnoses:  Bacterial vaginosis  Pelvic pain in female    New Prescriptions New Prescriptions   METRONIDAZOLE (FLAGYL) 500 MG TABLET    Take 1 tablet (500 mg total) by mouth 2 (two) times daily.     Lavera Guise, MD 01/19/17 2312

## 2017-01-19 NOTE — Discharge Instructions (Signed)
Please take antibiotics as prescribed. Follow-up closely with your gynecologist. Continue naproxen and tylenol for pain. Return for worsening symptoms, including fever, escalating pain, intractable vomiting or any other symptoms concerning to you.

## 2017-01-20 LAB — GC/CHLAMYDIA PROBE AMP
CHLAMYDIA, DNA PROBE: NEGATIVE
Neisseria gonorrhoeae by PCR: NEGATIVE

## 2017-01-21 LAB — URINE CULTURE

## 2017-01-22 ENCOUNTER — Other Ambulatory Visit: Payer: Self-pay | Admitting: Obstetrics & Gynecology

## 2017-01-22 ENCOUNTER — Telehealth: Payer: Self-pay | Admitting: *Deleted

## 2017-01-22 LAB — GC/CHLAMYDIA PROBE AMP (~~LOC~~) NOT AT ARMC
CHLAMYDIA, DNA PROBE: NEGATIVE
Neisseria Gonorrhea: NEGATIVE

## 2017-01-22 MED ORDER — FLUCONAZOLE 150 MG PO TABS: 150.0000 mg | ORAL_TABLET | Freq: Once | ORAL | 0 refills | Status: AC

## 2017-01-22 NOTE — Telephone Encounter (Signed)
diflcuan was eprescribed for the patient

## 2017-01-22 NOTE — Telephone Encounter (Signed)
Pt states that she has been taking a lot of anitibiotics and feels that she is now getting a yeast infection. She states that she is having some discharge and "it just feels like a yeast infection". I informed pt that I would send this information to a provider to see if they would prescribe something for her.

## 2017-01-23 ENCOUNTER — Telehealth: Payer: Self-pay | Admitting: Obstetrics & Gynecology

## 2017-01-23 NOTE — Telephone Encounter (Signed)
Pt called stating that the prescription for the diflucan that was prescribed is too expensive. Pt is requesting a generic pill if possible. Advised pt that I would send this request to Dr Despina HiddenEure. Advised pt that she could also try OTC yeast treatments, that might be cheaper, to see if they would help as well. Pt verbalized understanding.

## 2017-01-23 NOTE — Telephone Encounter (Signed)
Pt called stating that the medication Dr. Despina HiddenEure has prescribed her is way to expensive she states that it cost 173 dollars and she would like to know if we could send something her insurance will cover. Please contact pt

## 2017-01-23 NOTE — Telephone Encounter (Signed)
Pt [facility] [place].

## 2017-01-24 ENCOUNTER — Other Ambulatory Visit: Payer: Self-pay | Admitting: Obstetrics & Gynecology

## 2017-01-24 NOTE — Telephone Encounter (Signed)
It was sent as generic fluconazole

## 2017-03-07 ENCOUNTER — Telehealth: Payer: Self-pay | Admitting: Obstetrics & Gynecology

## 2017-03-07 MED ORDER — NAPROXEN SODIUM 550 MG PO TABS: 550.0000 mg | ORAL_TABLET | Freq: Two times a day (BID) | ORAL | 1 refills | Status: DC

## 2017-03-07 MED ORDER — PROMETHAZINE HCL 25 MG PO TABS: 25.0000 mg | ORAL_TABLET | Freq: Four times a day (QID) | ORAL | 1 refills | Status: DC | PRN

## 2017-03-07 NOTE — Telephone Encounter (Signed)
Pt requests refills on anaprox and wants something for nausea with period, will refill anaprox and rx phenergan

## 2017-03-08 ENCOUNTER — Other Ambulatory Visit: Payer: Self-pay | Admitting: Adult Health

## 2017-03-08 MED ORDER — NAPROXEN 375 MG PO TABS: 375.0000 mg | ORAL_TABLET | Freq: Two times a day (BID) | ORAL | 2 refills | Status: DC

## 2017-03-28 ENCOUNTER — Telehealth: Payer: Self-pay | Admitting: Adult Health

## 2017-03-28 ENCOUNTER — Telehealth: Payer: Self-pay | Admitting: *Deleted

## 2017-03-28 MED ORDER — NAPROXEN 500 MG PO TABS: 500.0000 mg | ORAL_TABLET | Freq: Two times a day (BID) | ORAL | 1 refills | Status: DC

## 2017-03-28 NOTE — Telephone Encounter (Signed)
Pt states that insurance will not pay for naproxen 375mg . Pharmacy told her that it needed to be 500mg . [facility] pt [facility] and she should check back with her pharmacy in the next 24 hours. Pt verbalized understanding .

## 2017-03-28 NOTE — Telephone Encounter (Signed)
Needs dose change on naproxen for insurance to cover.

## 2017-05-24 ENCOUNTER — Telehealth: Payer: Self-pay | Admitting: Adult Health

## 2017-05-24 NOTE — Telephone Encounter (Signed)
Patient states she is having some bleeding and is not due to start her period until next week. She is also experiencing discharge with an odor. Informed patient she would need to schedule an appointment to be seen to evaluate the discharge and bleeding. Pt verbalized understanding. Will get appointment made.

## 2017-05-25 ENCOUNTER — Ambulatory Visit (INDEPENDENT_AMBULATORY_CARE_PROVIDER_SITE_OTHER): Payer: Medicare Other | Admitting: Adult Health

## 2017-05-25 ENCOUNTER — Encounter: Payer: Self-pay | Admitting: Adult Health

## 2017-05-25 VITALS — BP 136/80 | HR 93 | Ht 63.0 in | Wt 219.5 lb

## 2017-05-25 DIAGNOSIS — N926 Irregular menstruation, unspecified: Secondary | ICD-10-CM | POA: Diagnosis not present

## 2017-05-25 DIAGNOSIS — N946 Dysmenorrhea, unspecified: Secondary | ICD-10-CM | POA: Diagnosis not present

## 2017-05-25 DIAGNOSIS — Q513 Bicornate uterus: Secondary | ICD-10-CM

## 2017-05-25 DIAGNOSIS — Z3202 Encounter for pregnancy test, result negative: Secondary | ICD-10-CM | POA: Diagnosis not present

## 2017-05-25 LAB — POCT URINE PREGNANCY: PREG TEST UR: NEGATIVE

## 2017-05-25 MED ORDER — IBUPROFEN 800 MG PO TABS: 800.0000 mg | ORAL_TABLET | Freq: Three times a day (TID) | ORAL | 1 refills | Status: DC | PRN

## 2017-05-25 NOTE — Patient Instructions (Signed)
F/U prn

## 2017-05-25 NOTE — Progress Notes (Signed)
Subjective:     Patient ID: Pamela Ford, female   DOB: 1985/10/10, 31 y.o.   MRN: [id number]  HPI Pamela Ford is a 31 year old black female in complaining of this  period being lighter than usual and has cramping.She denies odor, itching or burning, or new partners.  Review of Systems This period is lighter than normal Periods regular +cramps No new partners No odor, itching or burning  Reviewed past medical,surgical, social and family history. Reviewed medications and allergies.     Objective:   Physical Exam BP 136/80 (BP Location: Left Arm, Patient Position: Sitting, Cuff Size: Large)   Pulse 93   Ht 5\' 3"  (1.6 m)   Wt 219 lb 8 oz (99.6 kg)   LMP 05/23/2017   BMI 38.88 kg/m  UPT negative.  Skin warm and dry.Pelvic: external genitalia is normal in appearance no lesions, vagina:period like blood without odor,urethra has no lesions or masses noted, cervix:smooth and bulbous, uterus: normal size, non tender,is bicornuate, no masses felt, adnexa: no masses or tenderness noted. Bladder is non tender and no masses felt.  GC/CHL obtained.  She declines birth control.     Assessment:     1. Abnormal menstrual periods   2. Menstrual cramps   3. Bicornuate uterus   4. Pregnancy examination or test, negative result       Plan:     GC/CHL sent Meds ordered this encounter  Medications  . ibuprofen (ADVIL,MOTRIN) 800 MG tablet    Sig: Take 1 tablet (800 mg total) by mouth every 8 (eight) hours as needed.    Dispense:  60 tablet    Refill:  1    Order Specific Question:   Supervising Provider    Answer:   Duane LopeEURE, LUTHER H [[id number]]  F/U prn

## 2017-05-27 LAB — GC/CHLAMYDIA PROBE AMP
Chlamydia trachomatis, NAA: NEGATIVE
NEISSERIA GONORRHOEAE BY PCR: NEGATIVE

## 2017-07-03 ENCOUNTER — Telehealth: Payer: Self-pay | Admitting: Adult Health

## 2017-07-03 NOTE — Telephone Encounter (Signed)
Patient called stating that she needs a refill of her medication, pt states she uses [facility] in [place]. Please contact pt

## 2017-07-03 NOTE — Telephone Encounter (Signed)
Pt called requesting a refill on phenergan. Informed pt that I would send her request to Victorino DikeJennifer, but she is out of the office today. Advised pt to check with her pharmacy later tomorrow.

## 2017-07-04 MED ORDER — PROMETHAZINE HCL 25 MG PO TABS: 25.0000 mg | ORAL_TABLET | Freq: Four times a day (QID) | ORAL | 1 refills | Status: DC | PRN

## 2017-07-04 NOTE — Telephone Encounter (Signed)
Refilled phenergan

## 2017-08-09 ENCOUNTER — Telehealth: Payer: Self-pay | Admitting: *Deleted

## 2017-08-09 MED ORDER — NYSTATIN 100000 UNIT/GM EX CREA: 1.0000 "application " | TOPICAL_CREAM | Freq: Two times a day (BID) | CUTANEOUS | 0 refills | Status: DC

## 2017-08-09 MED ORDER — FLUCONAZOLE 150 MG PO TABS: ORAL_TABLET | ORAL | 0 refills | Status: DC

## 2017-08-09 NOTE — Telephone Encounter (Signed)
Will rx diflucan and nystatin

## 2017-08-09 NOTE — Telephone Encounter (Signed)
Pt called stating that she has white discharge with itching. She states that she knows she has a yeast infection because she has dealt with them several times before. She states that she has tried OTC creams and they are not helping. Pt requests a prescription for a pill and cream. Informed pt that I would send her request to a provider and she could check with her pharmacy later on today.

## 2017-09-05 DIAGNOSIS — R1110 Vomiting, unspecified: Secondary | ICD-10-CM | POA: Diagnosis not present

## 2017-09-05 DIAGNOSIS — R109 Unspecified abdominal pain: Secondary | ICD-10-CM | POA: Diagnosis not present

## 2017-09-05 DIAGNOSIS — R1032 Left lower quadrant pain: Secondary | ICD-10-CM | POA: Diagnosis not present

## 2017-09-05 DIAGNOSIS — I880 Nonspecific mesenteric lymphadenitis: Secondary | ICD-10-CM | POA: Diagnosis not present

## 2017-09-05 DIAGNOSIS — R590 Localized enlarged lymph nodes: Secondary | ICD-10-CM | POA: Diagnosis not present

## 2017-09-10 ENCOUNTER — Encounter: Payer: Self-pay | Admitting: Obstetrics and Gynecology

## 2017-09-10 ENCOUNTER — Ambulatory Visit (INDEPENDENT_AMBULATORY_CARE_PROVIDER_SITE_OTHER): Payer: Medicare Other | Admitting: Obstetrics and Gynecology

## 2017-09-10 VITALS — BP 136/74 | HR 89 | Ht 63.0 in | Wt 226.0 lb

## 2017-09-10 DIAGNOSIS — N946 Dysmenorrhea, unspecified: Secondary | ICD-10-CM

## 2017-09-10 DIAGNOSIS — E669 Obesity, unspecified: Secondary | ICD-10-CM

## 2017-09-10 DIAGNOSIS — R1909 Other intra-abdominal and pelvic swelling, mass and lump: Secondary | ICD-10-CM | POA: Diagnosis not present

## 2017-09-10 NOTE — Progress Notes (Signed)
[facility] Visit  09/10/2017            Patient name: Pamela Ford MRN [id number]  Date of birth: April 12, 1986  CC & HPI: menorrhagia  Pamela Ford is a 32 y.o. female presenting today for menorrhagia for 2 weeks now. She was seen in the [facility] ER on the 13th of Feb for the same issue. She did NOT an ultrasound done at the ER. She denies any pain today. She states the bleeding has stopped today. The ER told her that it is dysfunctional bleeding, instead of her menstrual cycle. No other associated symptoms noted. No alleviating factors noted. Patient has not tried any medications for relief. Her periods occur every month. She does endorse premenstrual abdominal pain, that continues into the first and second day of her period. Preg test negative. ROS:  ROS (+) menorrhagia (+) moderate pelvic pain All systems are negative except as noted in the HPI and PMH.   Pertinent History Reviewed:   Reviewed: Significant for dysmenorrhea Medical         Past Medical History:  Diagnosis Date  . Acid reflux   . Anxiety   . Bicornuate uterus   . BV (bacterial vaginosis) 07/06/2014  . Depression   . 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 Dysmenorrhea 03/31/2014  . Hypertension   . Impacted tooth 09/2013   impacted wisdom teeth  . Irregular periods 09/03/2014  . Leg pain, right    unknown cause, is being evaluated  . Obesity   . UTI (lower urinary tract infection) 09/03/2014  . Vaginal discharge 07/06/2014  . Yeast infection 09/21/2014                              Surgical Hx:    Past Surgical History:  Procedure Laterality Date  . ESOPHAGOGASTRODUODENOSCOPY N/A 06/23/2015   RMR: Retained gastric contents. Not all the stomach seen, however, upper GI tract that was seen appeared normal. Patient reports las intake of solid food 14 hours prior to this procedure. Therefore, Patient likely has an element of delayed gasric emptying. this could be mediction  effect and possibly a result underlying diabetic gastroparesis. Intoleratnt to multiple PPI's. Most recently pantoprozo  . NO PAST SURGERIES    . TOOTH EXTRACTION N/A 10/06/2013   Procedure: EXTRACTION THIRD MOLARS;  Surgeon: Georgia Lopes, DDS;  Location: [facility];  Service: Oral Surgery;  Laterality: N/A;   Medications: Reviewed & Updated - see associated section                       Current Outpatient Medications:  .  fluconazole (DIFLUCAN) 150 MG tablet, Take 1 now and 1 in 3 days if needed, Disp: 2 tablet, Rfl: 0 .  ibuprofen (ADVIL,MOTRIN) 800 MG tablet, Take 1 tablet (800 mg total) by mouth every 8 (eight) hours as needed., Disp: 60 tablet, Rfl: 1 .  nystatin cream (MYCOSTATIN), Apply 1 application topically 2 (two) times daily., Disp: 30 g, Rfl: 0 .  promethazine (PHENERGAN) 25 MG tablet, Take 1 tablet (25 mg total) by mouth every 6 (six) hours as needed for nausea or vomiting., Disp: 30 tablet, Rfl: 1   Social History: Reviewed -  reports that she has been smoking cigarettes.  She has been smoking about 0.00 packs per day for the past 5.00 years. she has never used smokeless  tobacco.  Objective Findings:  Vitals: Blood pressure 136/74, pulse 89, height 5\' 3"  (1.6 m), weight 226 lb (102.5 kg).  PHYSICAL EXAMINATION General appearance - alert, well appearing, and in no distress and oriented to person, place, and time Mental status - alert, oriented to person, place, and time, normal mood, behavior, speech, dress, motor activity, and thought processes Chest -  Heart -  Abdomen - firmness in lower abdomen, exam is difficult due to the obesity.  But there is a questionable mass-effect up to 10 cm above the umbilicus, Breasts -  Skin -   PELVIC External genitalia - normal Vulva - normal Vagina - normal Cervix -  large bulbous, 4 cm in diameter Uterus -10 cm above umbilicus, multiparous, abd wall thickness questionable of thickness above umbilicis on difficult  bimanual due to body habitus Adnexa - normal  Rectal - rectal exam not indicated  Assessment & Plan:   A:  1.  Obesity 2. Dysmenorrhia 3. Question of abdominal mass  P:  1. Schedule transvaginal ultrasound within 1 week, gyn f/u, may need followup CT 2. Further plans after ultrasound (and/or CT)    By signing my name below, I, Izna Ahmed, attest that this documentation has been prepared under the direction and in the presence of Tilda BurrowFerguson, Labrandon Knoch V, MD. Electronically Signed: Redge GainerIzna Ahmed, Medical Scribe. 09/10/17. 10:59 AM.  I personally performed the services described in this documentation, which was SCRIBED in my presence. The recorded information has been reviewed and considered accurate. It has been edited as necessary during review. Tilda BurrowJohn V Calogero Geisen, MD

## 2017-09-19 ENCOUNTER — Other Ambulatory Visit: Payer: Self-pay | Admitting: Obstetrics and Gynecology

## 2017-09-19 DIAGNOSIS — N852 Hypertrophy of uterus: Secondary | ICD-10-CM

## 2017-09-19 DIAGNOSIS — N946 Dysmenorrhea, unspecified: Secondary | ICD-10-CM

## 2017-09-21 ENCOUNTER — Ambulatory Visit (INDEPENDENT_AMBULATORY_CARE_PROVIDER_SITE_OTHER): Payer: Medicare Other | Admitting: Obstetrics and Gynecology

## 2017-09-21 ENCOUNTER — Ambulatory Visit (INDEPENDENT_AMBULATORY_CARE_PROVIDER_SITE_OTHER): Payer: Medicare Other

## 2017-09-21 ENCOUNTER — Encounter: Payer: Self-pay | Admitting: Obstetrics and Gynecology

## 2017-09-21 VITALS — BP 132/80 | HR 98 | Ht 64.0 in | Wt 216.2 lb

## 2017-09-21 DIAGNOSIS — N946 Dysmenorrhea, unspecified: Secondary | ICD-10-CM | POA: Diagnosis not present

## 2017-09-21 DIAGNOSIS — N852 Hypertrophy of uterus: Secondary | ICD-10-CM

## 2017-09-21 DIAGNOSIS — N920 Excessive and frequent menstruation with regular cycle: Secondary | ICD-10-CM

## 2017-09-21 DIAGNOSIS — Q513 Bicornate uterus: Secondary | ICD-10-CM

## 2017-09-21 DIAGNOSIS — R110 Nausea: Secondary | ICD-10-CM | POA: Diagnosis not present

## 2017-09-21 DIAGNOSIS — N760 Acute vaginitis: Secondary | ICD-10-CM | POA: Diagnosis not present

## 2017-09-21 DIAGNOSIS — B9689 Other specified bacterial agents as the cause of diseases classified elsewhere: Secondary | ICD-10-CM | POA: Diagnosis not present

## 2017-09-21 DIAGNOSIS — R102 Pelvic and perineal pain: Secondary | ICD-10-CM | POA: Diagnosis not present

## 2017-09-21 MED ORDER — TRAMADOL HCL 50 MG PO TABS: 50.0000 mg | ORAL_TABLET | Freq: Four times a day (QID) | ORAL | 0 refills | Status: DC | PRN

## 2017-09-21 MED ORDER — ONDANSETRON 4 MG PO TBDP: 4.0000 mg | ORAL_TABLET | Freq: Four times a day (QID) | ORAL | 1 refills | Status: DC | PRN

## 2017-09-21 NOTE — Progress Notes (Signed)
Patient ID: Pamela Ford, female   DOB: 02-11-1986, 32 y.o.   MRN: [id number]    [facility] Visit  @DATE @            Patient name: Pamela Ford MRN [id number]  Date of birth: 09/03/1985  CC & HPI:  Pamela Ford is a 32 y.o. female presenting today for a follow-up of her recent U/S.  I saw her last month and there was a concern that her abdominal wall was firm that there might be a mass she was seen on 09/10/2017 for menorrhagia. She states that when she has her menstrual cycle she experiences abdominal pain and nausea. She has tried ibuprofen without relief of the dysmenorrhea. She denies fever, chills or any other symptoms or complaints at this time.  ROS:  ROS +menorrhagia +pelvic pain +nausea -fever -chills All systems are negative except as noted in the HPI and PMH.  History of preterm delivery and records reveal bicornuate uterus Pertinent History Reviewed:   Reviewed: Significant for dysmenorrhea Medical         Past Medical History:  Diagnosis Date  . Acid reflux   . Anxiety   . Bicornuate uterus   . BV (bacterial vaginosis) 07/06/2014  . Depression   . 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 Dysmenorrhea 03/31/2014  . Hypertension   . Impacted tooth 09/2013   impacted wisdom teeth  . Irregular periods 09/03/2014  . Leg pain, right    unknown cause, is being evaluated  . Obesity   . UTI (lower urinary tract infection) 09/03/2014  . Vaginal discharge 07/06/2014  . Yeast infection 09/21/2014                              Surgical Hx:    Past Surgical History:  Procedure Laterality Date  . ESOPHAGOGASTRODUODENOSCOPY N/A 06/23/2015   RMR: Retained gastric contents. Not all the stomach seen, however, upper GI tract that was seen appeared normal. Patient reports las intake of solid food 14 hours prior to this procedure. Therefore, Patient likely has an element of delayed gasric emptying. this could be mediction effect and possibly a result  underlying diabetic gastroparesis. Intoleratnt to multiple PPI's. Most recently pantoprozo  . NO PAST SURGERIES    . TOOTH EXTRACTION N/A 10/06/2013   Procedure: EXTRACTION THIRD MOLARS;  Surgeon: Georgia Lopes, DDS;  Location: [facility];  Service: Oral Surgery;  Laterality: N/A;   Medications: Reviewed & Updated - see associated section                       Current Outpatient Medications:  .  fluconazole (DIFLUCAN) 150 MG tablet, Take 1 now and 1 in 3 days if needed (Patient not taking: Reported on 09/21/2017), Disp: 2 tablet, Rfl: 0 .  ibuprofen (ADVIL,MOTRIN) 800 MG tablet, Take 1 tablet (800 mg total) by mouth every 8 (eight) hours as needed. (Patient not taking: Reported on 09/21/2017), Disp: 60 tablet, Rfl: 1 .  nystatin cream (MYCOSTATIN), Apply 1 application topically 2 (two) times daily. (Patient not taking: Reported on 09/21/2017), Disp: 30 g, Rfl: 0 .  promethazine (PHENERGAN) 25 MG tablet, Take 1 tablet (25 mg total) by mouth every 6 (six) hours as needed for nausea or vomiting. (Patient not taking: Reported on 09/21/2017), Disp: 30 tablet, Rfl: 1   Social History: Reviewed -  reports that she  has been smoking cigarettes.  She has been smoking about 0.00 packs per day for the past 5.00 years. she has never used smokeless tobacco.  Objective Findings:  Vitals: Blood pressure 132/80, pulse 98, height 5\' 4"  (1.626 m), weight 216 lb 3.2 oz (98.1 kg), last menstrual period 09/08/2017.  PHYSICAL EXAMINATION General appearance - alert, well appearing, and in no distress, oriented to person, place, and time and overweight Mental status - alert, oriented to person, place, and time, normal mood, behavior, speech, dress, motor activity, and thought processes, affect appropriate to mood  PELVIC Deferred  Assessment & Plan:   A:  1. Bicornuate Uterus  2.  No evidence of pelvic mass 3.  Dysmenorrhea P:  1. Rx Tramadol, to use when ibuprofen is inadequate, to continue ibuprofen  as needed 2. Rx Zofran for nausea and discomfort associated with menstrual cramping 3. F/U PRN  By signing my name below, I, Diona BrownerJennifer Gorman, attest that this documentation has been prepared under the direction and in the presence of Tilda BurrowFerguson, Jaisa Defino V, MD. Electronically Signed: Diona BrownerJennifer Gorman, Medical Scribe. 09/21/17. 11:32 AM.  I personally performed the services described in this documentation, which was SCRIBED in my presence. The recorded information has been reviewed and considered accurate. It has been edited as necessary during review. Tilda BurrowJohn V Novah Nessel, MD

## 2017-09-21 NOTE — Progress Notes (Signed)
PELVIC US TA/TV: homogeneous anteverted bicornuate uterus,wnl,EEC right 18 mm,left 8 mm,normal ovaries bilat,small amount of complex fluid right adnexa,ovaries appear mobile,no pain during ultrasound

## 2017-10-15 ENCOUNTER — Telehealth: Payer: Self-pay | Admitting: *Deleted

## 2017-10-15 MED ORDER — TRAMADOL HCL 50 MG PO TABS: 50.0000 mg | ORAL_TABLET | Freq: Four times a day (QID) | ORAL | 0 refills | Status: DC | PRN

## 2017-10-15 NOTE — Telephone Encounter (Signed)
Patient states she is taking the Tramadol for abdomin and back pain. She has not been taking the Ibuprofen since it is not as strong and Tramadol works better. Informed patient that in the last note he mentioned to use Tramadol when Ibuprofen in inadequate and to continue as needed. The tramadol was prescribed for abdominal pain not back pain. States the back pain and abdominal pain go together. Patient is requesting a refill on Tramadol. Please advise.

## 2017-10-17 ENCOUNTER — Telehealth: Payer: Self-pay | Admitting: *Deleted

## 2017-10-17 NOTE — Telephone Encounter (Signed)
Informed patient prescription was refilled and will fax to pharmacy.

## 2017-11-14 ENCOUNTER — Ambulatory Visit (INDEPENDENT_AMBULATORY_CARE_PROVIDER_SITE_OTHER): Payer: Medicare Other | Admitting: Adult Health

## 2017-11-14 ENCOUNTER — Encounter: Payer: Self-pay | Admitting: Adult Health

## 2017-11-14 VITALS — BP 122/80 | HR 98 | Ht 63.0 in | Wt 222.0 lb

## 2017-11-14 DIAGNOSIS — N946 Dysmenorrhea, unspecified: Secondary | ICD-10-CM | POA: Diagnosis not present

## 2017-11-14 DIAGNOSIS — Q513 Bicornate uterus: Secondary | ICD-10-CM | POA: Diagnosis not present

## 2017-11-14 MED ORDER — MEGESTROL ACETATE 40 MG PO TABS: ORAL_TABLET | ORAL | 1 refills | Status: DC

## 2017-11-14 MED ORDER — DICLOFENAC POTASSIUM 50 MG PO TABS: ORAL_TABLET | ORAL | 0 refills | Status: DC

## 2017-11-14 NOTE — Progress Notes (Addendum)
Subjective:     Patient ID: Pamela Ford, female   DOB: [birth year], 32 y.o.   MRN: [id number]  HPI Pamela Ford is a 32 year old black female in complaining of pain with periods, right before and during. She saw Dr Emelda FearFerguson in March and has had US, he placed her on tramadol without relief.   Review of Systems Pain right before and during periods Reviewed past medical,surgical, social and family history. Reviewed medications and allergies.     Objective:   Physical Exam BP 122/80 (BP Location: Left Arm, Patient Position: Sitting, Cuff Size: Large)   Pulse 98   Ht 5\' 3"  (1.6 m)   Wt 222 lb (100.7 kg)   LMP 11/01/2017   BMI 39.33 kg/m  PHQ 9 score 0. Skin warm and dry.Pelvic: external genitalia is normal in appearance no lesions, vagina: scant discharge without odor,urethra has no lesions or masses noted, cervix:smooth and bulbous, uterus: bicornuate, non tender, no masses felt, adnexa: no masses or tenderness noted. Bladder is non tender and no masses felt. GC/CHL obtained.    Start megace when first notices pain to stop period, and try diclofenac for bad cramps. Face time 15 minutes, with 50% counseling.  Assessment:     1. Dysmenorrhea   2. Uterus bicornis       Plan:    Stop tramadol GC/CHL sent  Meds ordered this encounter  Medications  . megestrol (MEGACE) 40 MG tablet    Sig: Take 2 daily to stop bleeding    Dispense:  60 tablet    Refill:  1    Order Specific Question:   Supervising Provider    Answer:   Despina HiddenEURE, LUTHER H [[id number]]  . diclofenac (CATAFLAM) 50 MG tablet    Sig: Take 1 every 8 hours as needed for bad period cramps    Dispense:  30 tablet    Refill:  0    Order Specific Question:   Supervising Provider    Answer:   Duane LopeEURE, LUTHER H [[id number]]  F/U in 3 weeks Review handout on dysmenorrhea  May try orlissa if pain persists

## 2017-11-14 NOTE — Addendum Note (Signed)
Addended by: Cyril MourningGRIFFIN, Caz Weaver A on: 11/14/2017 03:34 PM   Modules accepted: Orders

## 2017-11-14 NOTE — Patient Instructions (Signed)
Dysmenorrhea °Menstrual cramps (dysmenorrhea) are caused by the muscles of the uterus tightening (contracting) during a menstrual period. For some women, this discomfort is merely bothersome. For others, dysmenorrhea can be severe enough to interfere with everyday activities for a few days each month. °Primary dysmenorrhea is menstrual cramps that last a couple of days when you start having menstrual periods or soon after. This often begins after a teenager starts having her period. As a woman gets older or has a baby, the cramps will usually lessen or disappear. Secondary dysmenorrhea begins later in life, lasts longer, and the pain may be stronger than primary dysmenorrhea. The pain may start before the period and last a few days after the period. °What are the causes? °Dysmenorrhea is usually caused by an underlying problem, such as: °· The tissue lining the uterus grows outside of the uterus in other areas of the body (endometriosis). °· The endometrial tissue, which normally lines the uterus, is found in or grows into the muscular walls of the uterus (adenomyosis). °· The pelvic blood vessels are engorged with blood just before the menstrual period (pelvic congestive syndrome). °· Overgrowth of cells (polyps) in the lining of the uterus or cervix. °· Falling down of the uterus (prolapse) because of loose or stretched ligaments. °· Depression. °· Bladder problems, infection, or inflammation. °· Problems with the intestine, a tumor, or irritable bowel syndrome. °· Cancer of the female organs or bladder. °· A severely tipped uterus. °· A very tight opening or closed cervix. °· Noncancerous tumors of the uterus (fibroids). °· Pelvic inflammatory disease (PID). °· Pelvic scarring (adhesions) from a previous surgery. °· Ovarian cyst. °· An intrauterine device (IUD) used for birth control. °What increases the risk? °You may be at greater risk of dysmenorrhea if: °· You are younger than age 30. °· You started puberty  early. °· You have irregular or heavy bleeding. °· You have never given birth. °· You have a family history of this problem. °· You are a smoker. °What are the signs or symptoms? °· Cramping or throbbing pain in your lower abdomen. °· Headaches. °· Lower back pain. °· Nausea or vomiting. °· Diarrhea. °· Sweating or dizziness. °· Loose stools. °How is this diagnosed? °A diagnosis is based on your history, symptoms, physical exam, diagnostic tests, or procedures. Diagnostic tests or procedures may include: °· Blood tests. °· Ultrasonography. °· An examination of the lining of the uterus (dilation and curettage, D&C). °· An examination inside your abdomen or pelvis with a scope (laparoscopy). °· X-rays. °· CT scan. °· MRI. °· An examination inside the bladder with a scope (cystoscopy). °· An examination inside the intestine or stomach with a scope (colonoscopy, gastroscopy). °How is this treated? °Treatment depends on the cause of the dysmenorrhea. Treatment may include: °· Pain medicine prescribed by your health care provider. °· Birth control pills or an IUD with progesterone hormone in it. °· Hormone replacement therapy. °· Nonsteroidal anti-inflammatory drugs (NSAIDs). These may help stop the production of prostaglandins. °· Surgery to remove adhesions, endometriosis, ovarian cyst, or fibroids. °· Removal of the uterus (hysterectomy). °· Progesterone shots to stop the menstrual period. °· Cutting the nerves on the sacrum that go to the female organs (presacral neurectomy). °· Electric current to the sacral nerves (sacral nerve stimulation). °· Antidepressant medicine. °· Psychiatric therapy, counseling, or group therapy. °· Exercise and physical therapy. °· Meditation and yoga therapy. °· Acupuncture. °Follow these instructions at home: °· Only take over-the-counter or prescription medicines as directed   by your health care provider. °· Place a heating pad or hot water bottle on your lower back or abdomen. Do not  sleep with the heating pad. °· Use aerobic exercises, walking, swimming, biking, and other exercises to help lessen the cramping. °· Massage to the lower back or abdomen may help. °· Stop smoking. °· Avoid alcohol and caffeine. °Contact a health care provider if: °· Your pain does not get better with medicine. °· You have pain with sexual intercourse. °· Your pain increases and is not controlled with medicines. °· You have abnormal vaginal bleeding with your period. °· You develop nausea or vomiting with your period that is not controlled with medicine. °Get help right away if: °You pass out. °This information is not intended to replace advice given to you by your health care provider. Make sure you discuss any questions you have with your health care provider. °Document Released: 07/10/2005 Document Revised: 12/16/2015 Document Reviewed: 12/26/2012 °Elsevier Interactive Patient Education © 2017 [employer] ° °

## 2017-11-16 LAB — GC/CHLAMYDIA PROBE AMP
CHLAMYDIA, DNA PROBE: NEGATIVE
Neisseria gonorrhoeae by PCR: NEGATIVE

## 2017-12-05 ENCOUNTER — Ambulatory Visit (INDEPENDENT_AMBULATORY_CARE_PROVIDER_SITE_OTHER): Payer: Medicare Other | Admitting: Adult Health

## 2017-12-05 ENCOUNTER — Encounter: Payer: Self-pay | Admitting: Adult Health

## 2017-12-05 VITALS — BP 142/88 | HR 82 | Ht 64.0 in | Wt 223.0 lb

## 2017-12-05 DIAGNOSIS — Q513 Bicornate uterus: Secondary | ICD-10-CM

## 2017-12-05 DIAGNOSIS — N926 Irregular menstruation, unspecified: Secondary | ICD-10-CM | POA: Diagnosis not present

## 2017-12-05 DIAGNOSIS — N946 Dysmenorrhea, unspecified: Secondary | ICD-10-CM

## 2017-12-05 MED ORDER — DICLOFENAC POTASSIUM 50 MG PO TABS: ORAL_TABLET | ORAL | 0 refills | Status: DC

## 2017-12-05 NOTE — Progress Notes (Signed)
  Subjective:     Patient ID: Pamela Ford, female   DOB: Oct 24, 1985, 32 y.o.   MRN: [id number]  HPI Pamela Ford is a 32 year old black female back in follow up of taking megace and diclofenac with period and is much better.  Review of Systems Feels much better,"the meds are working" Reviewed past medical,surgical, social and family history. Reviewed medications and allergies.     Objective:   Physical Exam BP (!) 142/88 (BP Location: Left Arm, Patient Position: Sitting, Cuff Size: Large)   Pulse 82   Ht  (1.626 m)   Wt 223 lb (101.2 kg)   LMP 10/28/2017 Comment: takes Megace  BMI 38.28 kg/m  Skin warm and dry. Lungs: clear to ausculation bilaterally. Cardiovascular: regular rate and rhythm.   Will continue megace and diclofenac prn, since feeling better.  Assessment:     1. Dysmenorrhea   2. Abnormal menstrual periods   3. Uterus bicornis       Plan:     Continue megace as needed Meds ordered this encounter  Medications  . diclofenac (CATAFLAM) 50 MG tablet    Sig: Take 1 every 8 hours as needed for bad period cramps    Dispense:  30 tablet    Refill:  0    Order Specific Question:   Supervising Provider    Answer:   Lazaro Arms [[id number]]  F/U end of June for physical

## 2017-12-06 ENCOUNTER — Other Ambulatory Visit: Payer: Self-pay | Admitting: Adult Health

## 2017-12-06 MED ORDER — DICLOFENAC SODIUM 75 MG PO TBEC: DELAYED_RELEASE_TABLET | ORAL | 1 refills | Status: DC

## 2017-12-06 NOTE — Progress Notes (Signed)
Pharmacy can't get 50 of diclofenac by can 75 will OK 75 mg

## 2018-01-02 ENCOUNTER — Telehealth: Payer: Self-pay | Admitting: Obstetrics & Gynecology

## 2018-01-02 MED ORDER — MEGESTROL ACETATE 40 MG PO TABS: ORAL_TABLET | ORAL | 1 refills | Status: DC

## 2018-01-02 NOTE — Telephone Encounter (Signed)
Refilled megace  

## 2018-01-09 ENCOUNTER — Telehealth: Payer: Self-pay | Admitting: *Deleted

## 2018-01-09 NOTE — Telephone Encounter (Signed)
Spoke with pt. Pt takes Megace BID. She is having vaginal spotting everyday. It started Thursday. I spoke with JAG and she advised can take 3 tabs at same time x 5 days and then cut back. Pt voiced understanding. JSY

## 2018-01-10 ENCOUNTER — Telehealth: Payer: Self-pay | Admitting: *Deleted

## 2018-01-10 NOTE — Telephone Encounter (Signed)
Spoke with pt. Pt is taking 3 Megace starting yesterday. She woke up to a mess this am. Took 3 Megace this am. I spoke with JAG and she advised to take 3 Megace everyday for a total of 5 days. Has appt here on 6/26. Advised to keep that appt. Pt voiced understanding. JSY

## 2018-01-16 ENCOUNTER — Encounter: Payer: Self-pay | Admitting: Adult Health

## 2018-01-16 ENCOUNTER — Ambulatory Visit (INDEPENDENT_AMBULATORY_CARE_PROVIDER_SITE_OTHER): Payer: Medicare Other | Admitting: Adult Health

## 2018-01-16 VITALS — BP 135/79 | HR 107 | Ht 62.25 in | Wt 232.0 lb

## 2018-01-16 DIAGNOSIS — Z113 Encounter for screening for infections with a predominantly sexual mode of transmission: Secondary | ICD-10-CM | POA: Diagnosis not present

## 2018-01-16 DIAGNOSIS — Q513 Bicornate uterus: Secondary | ICD-10-CM

## 2018-01-16 DIAGNOSIS — Z01419 Encounter for gynecological examination (general) (routine) without abnormal findings: Secondary | ICD-10-CM

## 2018-01-16 DIAGNOSIS — N946 Dysmenorrhea, unspecified: Secondary | ICD-10-CM | POA: Diagnosis not present

## 2018-01-16 DIAGNOSIS — Z01411 Encounter for gynecological examination (general) (routine) with abnormal findings: Secondary | ICD-10-CM

## 2018-01-16 MED ORDER — DICLOFENAC SODIUM 75 MG PO TBEC: DELAYED_RELEASE_TABLET | ORAL | 1 refills | Status: DC

## 2018-01-16 NOTE — Progress Notes (Signed)
Patient ID: Pamela Ford, female   DOB: July 07, 1986, 32 y.o.   MRN: [id number] History of Present Illness: Pamela Ford is a 32 year old black female in for well woman gyn exam,she had normal pap 01/13/16. PCP is Dr Felecia ShellingFanta.  Current Medications, Allergies, Past Medical History, Past Surgical History, Family History and Social History were reviewed in [facility] electronic medical record.     Review of Systems:  Patient denies any headaches, hearing loss, fatigue, blurred vision, shortness of breath, chest pain, abdominal pain, problems with bowel movements, urination, or intercourse. No joint pain or mood swings. Spots some with megace and period pain is better still occasional cramp, but better.Has had unprotected sex.   Physical Exam:BP 135/79 (BP Location: Left Arm, Patient Position: Sitting, Cuff Size: Large)   Pulse (!) 107   Ht 5' 2.25" (1.581 m)   Wt 232 lb (105.2 kg)   LMP 01/03/2018 (Approximate) Comment: takes Megace  BMI 42.09 kg/m  General:  Well developed, well nourished, no acute distress Skin:  Warm and dry,has areas of ?vitiligo over body Neck:  Midline trachea, normal thyroid, good ROM, no lymphadenopathy Lungs; Clear to auscultation bilaterally Breast:  No dominant palpable mass, retraction, or nipple discharge Cardiovascular: Regular rate and rhythm Abdomen:  Soft, non tender, no hepatosplenomegaly Pelvic:  External genitalia is normal in appearance, no lesions.  The vagina is normal in appearance,old brown blood in vault. Urethra has no lesions or masses. The cervix is smooth, GC/CHL obtained.  Uterus is felt to be bicornuate, and non tender.  No adnexal masses or tenderness noted.Bladder is non tender, no masses felt. Extremities/musculoskeletal:  No swelling or varicosities noted, no clubbing or cyanosis Psych:  No mood changes, alert and cooperative,seems happy PHQ 2 score 0.   Impression: 1. Encounter for well woman exam with routine gynecological exam   2.  Bicornuate uterus   3. Dysmenorrhea   4. Screening examination for STD (sexually transmitted disease)       Plan: F/U in 1 month for ROS GC/CHL sent  Continue megace and use condoms Will refill diclofenac Meds ordered this encounter  Medications  . diclofenac (VOLTAREN) 75 MG EC tablet    Sig: Take 1 every 8 hours prn cramping    Dispense:  30 tablet    Refill:  1    Order Specific Question:   Supervising Provider    Answer:   Duane LopeEURE, LUTHER H [[id number]]   Pap and physical in 1 year

## 2018-01-18 LAB — GC/CHLAMYDIA PROBE AMP
CHLAMYDIA, DNA PROBE: NEGATIVE
NEISSERIA GONORRHOEAE BY PCR: NEGATIVE

## 2018-01-23 DIAGNOSIS — K219 Gastro-esophageal reflux disease without esophagitis: Secondary | ICD-10-CM | POA: Diagnosis not present

## 2018-01-23 DIAGNOSIS — R7989 Other specified abnormal findings of blood chemistry: Secondary | ICD-10-CM | POA: Diagnosis not present

## 2018-01-23 DIAGNOSIS — Z0001 Encounter for general adult medical examination with abnormal findings: Secondary | ICD-10-CM | POA: Diagnosis not present

## 2018-01-23 DIAGNOSIS — Z1389 Encounter for screening for other disorder: Secondary | ICD-10-CM | POA: Diagnosis not present

## 2018-01-23 DIAGNOSIS — Z87891 Personal history of nicotine dependence: Secondary | ICD-10-CM | POA: Diagnosis not present

## 2018-02-12 ENCOUNTER — Telehealth: Payer: Self-pay | Admitting: *Deleted

## 2018-02-12 NOTE — Telephone Encounter (Signed)
Spoke with pt. Pt is requesting a refill on Voltaren. Having to reschedule appt that was scheduled on 02/14/18. Call transferred to front desk to reschedule appt.  Please advise. Thanks!! JSY

## 2018-02-13 MED ORDER — DICLOFENAC SODIUM 75 MG PO TBEC: DELAYED_RELEASE_TABLET | ORAL | 1 refills | Status: DC

## 2018-02-13 NOTE — Telephone Encounter (Signed)
Refilled voltaren

## 2018-02-14 ENCOUNTER — Ambulatory Visit: Payer: Medicare Other | Admitting: Adult Health

## 2018-02-19 ENCOUNTER — Ambulatory Visit: Payer: Medicare Other | Admitting: Adult Health

## 2018-02-25 ENCOUNTER — Telehealth: Payer: Self-pay | Admitting: *Deleted

## 2018-02-25 MED ORDER — MEGESTROL ACETATE 40 MG PO TABS: ORAL_TABLET | ORAL | 1 refills | Status: DC

## 2018-02-25 NOTE — Telephone Encounter (Signed)
[facility]

## 2018-02-26 ENCOUNTER — Ambulatory Visit: Payer: Medicare Other | Admitting: Adult Health

## 2018-03-06 ENCOUNTER — Ambulatory Visit: Payer: Medicare Other | Admitting: Adult Health

## 2018-03-06 ENCOUNTER — Other Ambulatory Visit: Payer: Self-pay | Admitting: Adult Health

## 2018-03-06 ENCOUNTER — Telehealth: Payer: Self-pay | Admitting: Adult Health

## 2018-03-06 MED ORDER — DICLOFENAC SODIUM 75 MG PO TBEC: DELAYED_RELEASE_TABLET | ORAL | 1 refills | Status: DC

## 2018-03-06 NOTE — Telephone Encounter (Signed)
Rf: Voltaren 75mg  Uses [facility]

## 2018-03-06 NOTE — Progress Notes (Signed)
appt 8/27 needs refill on voltaren, done

## 2018-03-19 ENCOUNTER — Ambulatory Visit: Payer: Medicare Other | Admitting: Adult Health

## 2018-03-27 ENCOUNTER — Telehealth: Payer: Self-pay | Admitting: *Deleted

## 2018-03-27 NOTE — Telephone Encounter (Signed)
Patient requesting refill on Voltaren.  States she takes 1-2 tablets daily.

## 2018-03-28 MED ORDER — DICLOFENAC SODIUM 75 MG PO TBEC: DELAYED_RELEASE_TABLET | ORAL | 1 refills | Status: DC

## 2018-03-28 NOTE — Telephone Encounter (Signed)
Refilled Voltaren

## 2018-04-01 ENCOUNTER — Telehealth: Payer: Self-pay | Admitting: Adult Health

## 2018-04-01 MED ORDER — TERCONAZOLE 0.4 % VA CREA: 1.0000 | TOPICAL_CREAM | Freq: Every day | VAGINAL | 0 refills | Status: DC

## 2018-04-01 NOTE — Telephone Encounter (Signed)
rx terazol 

## 2018-04-01 NOTE — Telephone Encounter (Signed)
Said she needs cream called in for a yeast infection/ [facility] in [place]

## 2018-04-01 NOTE — Telephone Encounter (Signed)
Spoke with pt. Pt has a yeast infection and is requesting a cream. OTC meds not really helping. Please advise. Thanks!! JSY

## 2018-04-03 ENCOUNTER — Ambulatory Visit: Payer: Medicare Other | Admitting: Adult Health

## 2018-04-26 ENCOUNTER — Ambulatory Visit: Payer: Medicare Other | Admitting: Adult Health

## 2018-05-02 ENCOUNTER — Telehealth: Payer: Self-pay | Admitting: Adult Health

## 2018-05-02 MED ORDER — MEGESTROL ACETATE 40 MG PO TABS: ORAL_TABLET | ORAL | 0 refills | Status: DC

## 2018-05-02 NOTE — Telephone Encounter (Signed)
Spoke with pt. Pt is requesting a refill on Megace. Thanks!! JSY 

## 2018-05-02 NOTE — Telephone Encounter (Signed)
Patient called stating that she would like a refill of her medication, Patient uses [facility] in [place]. Please Contact pt

## 2018-05-23 ENCOUNTER — Encounter: Payer: Self-pay | Admitting: Internal Medicine

## 2018-05-31 ENCOUNTER — Telehealth: Payer: Self-pay | Admitting: *Deleted

## 2018-05-31 MED ORDER — MEGESTROL ACETATE 40 MG PO TABS: ORAL_TABLET | ORAL | 0 refills | Status: DC

## 2018-05-31 NOTE — Telephone Encounter (Signed)
Refill megace  

## 2018-06-17 ENCOUNTER — Telehealth: Payer: Self-pay | Admitting: Adult Health

## 2018-06-17 ENCOUNTER — Other Ambulatory Visit: Payer: Self-pay | Admitting: *Deleted

## 2018-06-17 MED ORDER — MEGESTROL ACETATE 40 MG PO TABS: ORAL_TABLET | ORAL | 0 refills | Status: DC

## 2018-06-17 NOTE — Telephone Encounter (Signed)
Pt called requesting a refill on her megestrol until she can get in to her appt with Victorino DikeJennifer. Advised that I would send her request to a provider and she could check with her pharmacy in the next 24 hours. Pt verbalized understanding.

## 2018-06-17 NOTE — Telephone Encounter (Signed)
Patient called, scheduled an appointment for 07/02/18 with Victorino DikeJennifer to discuss medications.  She stated that she needs more Megestrol to last her until her appointment.  [facility] [place]  [phone]

## 2018-07-02 ENCOUNTER — Ambulatory Visit: Payer: Medicare Other | Admitting: Adult Health

## 2018-07-22 ENCOUNTER — Telehealth: Payer: Self-pay | Admitting: *Deleted

## 2018-07-22 ENCOUNTER — Encounter: Payer: Self-pay | Admitting: *Deleted

## 2018-07-22 ENCOUNTER — Ambulatory Visit: Payer: Medicare Other | Admitting: Adult Health

## 2018-07-22 DIAGNOSIS — H16223 Keratoconjunctivitis sicca, not specified as Sjogren's, bilateral: Secondary | ICD-10-CM | POA: Diagnosis not present

## 2018-07-22 NOTE — Telephone Encounter (Signed)
Pt called requesting a refill on megestrol. Advised that she was scheduled for an appt this morning with Victorino DikeJennifer and she did not come. Therefore she will need to make another appt to get a refill. Pt states she rescheduled for the 16th. Advised that she could discuss refills with Victorino DikeJennifer at that appt. Pt verbalized understanding.

## 2018-08-08 ENCOUNTER — Encounter: Payer: Self-pay | Admitting: Adult Health

## 2018-08-08 ENCOUNTER — Ambulatory Visit (INDEPENDENT_AMBULATORY_CARE_PROVIDER_SITE_OTHER): Payer: Medicare Other | Admitting: Adult Health

## 2018-08-08 VITALS — BP 134/90 | HR 97 | Ht 64.0 in | Wt 244.5 lb

## 2018-08-08 DIAGNOSIS — R109 Unspecified abdominal pain: Secondary | ICD-10-CM

## 2018-08-08 DIAGNOSIS — Q513 Bicornate uterus: Secondary | ICD-10-CM

## 2018-08-08 DIAGNOSIS — N946 Dysmenorrhea, unspecified: Secondary | ICD-10-CM

## 2018-08-08 MED ORDER — DICLOFENAC SODIUM 75 MG PO TBEC: DELAYED_RELEASE_TABLET | ORAL | 1 refills | Status: DC

## 2018-08-08 MED ORDER — MEGESTROL ACETATE 40 MG PO TABS: ORAL_TABLET | ORAL | 6 refills | Status: DC

## 2018-08-08 NOTE — Progress Notes (Signed)
Patient ID: Pamela Ford, female   DOB: September 26, 1985, 33 y.o.   MRN: [id number] History of Present Illness: Pamela Ford is a 33 year old black female in to discuss staying on megace to periods and stop the dysmenorrhea, it is working but will still abdominal cramps at times.  PCP is Dr Felecia Shelling.   Current Medications, Allergies, Past Medical History, Past Surgical History, Family History and Social History were reviewed in [employer] record.     Review of Systems: +cramps No bleeding with megace No pain with sex She says she may want to get pregnant in future   Physical Exam:BP 134/90 (BP Location: Left Arm, Patient Position: Sitting, Cuff Size: Large)   Pulse 97   Ht 5\' 4"  (1.626 m)   Wt 244 lb 8 oz (110.9 kg)   BMI 41.97 kg/m  General:  Well developed, well nourished, no acute distress Skin:  Warm and dry Lungs; Clear to auscultation bilaterally Cardiovascular: RRR Psych:  No mood changes, alert and cooperative,seems happy Fall risk is low. Discussed will continue megace for now.  Impression:  1. Abdominal cramps   2. Bicornuate uterus   3. Dysmenorrhea      Plan: Meds ordered this encounter  Medications  . megestrol (MEGACE) 40 MG tablet    Sig: Take 2 daily to stop bleeding    Dispense:  60 tablet    Refill:  6    Order Specific Question:   Supervising Provider    Answer:   Despina Hidden, LUTHER H [[id number]]  . diclofenac (VOLTAREN) 75 MG EC tablet    Sig: Take 1 every 8 hours prn cramping    Dispense:  30 tablet    Refill:  1    Order Specific Question:   Supervising Provider    Answer:   Duane Lope H [[id number]]  Return in 6 months for pap and physical

## 2018-09-24 ENCOUNTER — Telehealth: Payer: Self-pay | Admitting: Adult Health

## 2018-09-24 NOTE — Telephone Encounter (Signed)
Patient called, requesting a refill on Voltaren.  [facility] [place]  [phone]

## 2018-09-25 MED ORDER — DICLOFENAC SODIUM 75 MG PO TBEC: DELAYED_RELEASE_TABLET | ORAL | 1 refills | Status: DC

## 2018-09-25 NOTE — Telephone Encounter (Signed)
Refilled Voltaren

## 2018-09-25 NOTE — Telephone Encounter (Signed)
Patient called today regarding her refills. Please advise   See previous message.

## 2018-09-25 NOTE — Telephone Encounter (Signed)
Spoke with pt. Pt is requesting a refill on Voltaren. Having to take 1-2 daily for cramps. Please advise. Thanks!! JSY

## 2018-09-25 NOTE — Addendum Note (Signed)
Addended by: Cyril Mourning A on: 09/25/2018 01:34 PM   Modules accepted: Orders

## 2018-11-18 ENCOUNTER — Telehealth: Payer: Self-pay | Admitting: Adult Health

## 2018-11-18 MED ORDER — DICLOFENAC SODIUM 75 MG PO TBEC: DELAYED_RELEASE_TABLET | ORAL | 1 refills | Status: DC

## 2018-11-18 NOTE — Telephone Encounter (Signed)
Pt requesting a refill on Voltaren 75 mg. Thanks!! JSY

## 2018-11-18 NOTE — Telephone Encounter (Signed)
Refill Voltaren 

## 2018-11-18 NOTE — Telephone Encounter (Signed)
Left message @ 3:47 pm. JSY

## 2018-11-18 NOTE — Telephone Encounter (Signed)
Patient called, requesting a refill on her pain medication.  [facility] [place]  [phone]

## 2019-01-20 ENCOUNTER — Encounter: Payer: Self-pay | Admitting: Adult Health

## 2019-01-20 ENCOUNTER — Ambulatory Visit (INDEPENDENT_AMBULATORY_CARE_PROVIDER_SITE_OTHER): Payer: Medicare Other | Admitting: Adult Health

## 2019-01-20 ENCOUNTER — Other Ambulatory Visit: Payer: Self-pay

## 2019-01-20 ENCOUNTER — Other Ambulatory Visit (HOSPITAL_COMMUNITY)
Admission: RE | Admit: 2019-01-20 | Discharge: 2019-01-20 | Disposition: A | Discharge status: Home / Self Care | Payer: Medicare Other | Source: Ambulatory Visit | Attending: Adult Health | Admitting: Adult Health

## 2019-01-20 VITALS — BP 103/87 | HR 112 | Ht 63.0 in | Wt 256.5 lb

## 2019-01-20 DIAGNOSIS — Z124 Encounter for screening for malignant neoplasm of cervix: Secondary | ICD-10-CM

## 2019-01-20 DIAGNOSIS — Z1151 Encounter for screening for human papillomavirus (HPV): Secondary | ICD-10-CM | POA: Diagnosis not present

## 2019-01-20 DIAGNOSIS — N946 Dysmenorrhea, unspecified: Secondary | ICD-10-CM

## 2019-01-20 DIAGNOSIS — Z01419 Encounter for gynecological examination (general) (routine) without abnormal findings: Secondary | ICD-10-CM

## 2019-01-20 DIAGNOSIS — Q513 Bicornate uterus: Secondary | ICD-10-CM

## 2019-01-20 DIAGNOSIS — N926 Irregular menstruation, unspecified: Secondary | ICD-10-CM

## 2019-01-20 MED ORDER — DICLOFENAC SODIUM 75 MG PO TBEC: DELAYED_RELEASE_TABLET | ORAL | 1 refills | Status: DC

## 2019-01-20 MED ORDER — MEGESTROL ACETATE 40 MG PO TABS: ORAL_TABLET | ORAL | 6 refills | Status: DC

## 2019-01-20 NOTE — Progress Notes (Signed)
Patient ID: Pamela Ford, female   DOB: Dec 08, 1985, 33 y.o.   MRN: [id number] History of Present Illness: Pamela Ford is a 33 year old black female, single,G2P1, in for a well woman gyn exam and pap. PCP is Dr Legrand Rams.   Current Medications, Allergies, Past Medical History, Past Surgical History, Family History and Social History were reviewed in [employer] record.     Review of Systems: Patient denies any headaches, hearing loss, fatigue, blurred vision, shortness of breath, chest pain, abdominal pain, problems with bowel movements, urination, or intercourse. No joint pain or mood swings.    Physical Exam:BP 103/87 (BP Location: Left Arm, Patient Position: Sitting, Cuff Size: Normal)   Pulse (!) 112   Ht 5\' 3"  (1.6 m)   Wt 256 lb 8 oz (116.3 kg)   BMI 45.44 kg/m  General:  Well developed, well nourished, no acute distress Skin:  Warm and dry Neck:  Midline trachea, normal thyroid, good ROM, no lymphadenopathy Lungs; Clear to auscultation bilaterally Breast:  No dominant palpable mass, retraction, or nipple discharge Cardiovascular: Regular rate and rhythm Abdomen:  Soft, non tender, no hepatosplenomegaly Pelvic:  External genitalia is normal in appearance, no lesions.  The vagina is normal in appearance. Urethra has no lesions or masses. The cervix is smooth, pap with HPV and GC/CHL performed  Uterus is felt to be normal size, shape, and contour.  No adnexal masses or tenderness noted.Bladder is non tender, no masses felt. Extremities/musculoskeletal:  No swelling or varicosities noted, no clubbing or cyanosis Psych:  No mood changes, alert and cooperative,seems happy Fall risk is low. PHQ 2 score is 0 Examination chaperoned by Levy Pupa LPN.    Impression: 1. Encounter for gynecological examination with Papanicolaou smear of cervix   2. Routine cervical smear   3. Uterus bicornis   4. Dysmenorrhea   5. Abnormal menstrual periods       Plan:  Meds  ordered this encounter  Medications  . megestrol (MEGACE) 40 MG tablet    Sig: Take 2 daily to stop bleeding    Dispense:  60 tablet    Refill:  6    Order Specific Question:   Supervising Provider    Answer:   Elonda Husky, LUTHER H [[id number]]  . diclofenac (VOLTAREN) 75 MG EC tablet    Sig: Take 1 every 8 hours prn cramping    Dispense:  30 tablet    Refill:  1    Order Specific Question:   Supervising Provider    Answer:   Florian Buff [[id number]]  Physical in 1 year Pap in 3 if normal Labs with PCP.

## 2019-01-22 ENCOUNTER — Telehealth: Payer: Self-pay | Admitting: Adult Health

## 2019-01-22 LAB — CYTOLOGY - PAP
Chlamydia: NEGATIVE
Diagnosis: NEGATIVE
HPV: NOT DETECTED
Neisseria Gonorrhea: NEGATIVE

## 2019-01-22 MED ORDER — PROMETHAZINE HCL 25 MG PO TABS: 25.0000 mg | ORAL_TABLET | Freq: Four times a day (QID) | ORAL | 1 refills | Status: DC | PRN

## 2019-01-22 NOTE — Telephone Encounter (Signed)
Patient presented to the office requesting nausea medication.  She was recently seen.  [facility] [place]  [phone]

## 2019-01-22 NOTE — Telephone Encounter (Signed)
Will rx phenergan  

## 2019-03-04 ENCOUNTER — Other Ambulatory Visit: Payer: Self-pay | Admitting: Adult Health

## 2019-03-04 MED ORDER — FLUCONAZOLE 150 MG PO TABS: 150.0000 mg | ORAL_TABLET | Freq: Once | ORAL | 0 refills | Status: AC

## 2019-03-04 MED ORDER — DICLOFENAC SODIUM 75 MG PO TBEC: DELAYED_RELEASE_TABLET | ORAL | 1 refills | Status: DC

## 2019-03-04 NOTE — Telephone Encounter (Signed)
Pt requesting medication for a yeast infection and also a refill of diclofenac

## 2019-03-06 ENCOUNTER — Other Ambulatory Visit: Payer: Self-pay | Admitting: Adult Health

## 2019-03-07 ENCOUNTER — Other Ambulatory Visit: Payer: Self-pay | Admitting: *Deleted

## 2019-03-10 MED ORDER — PROMETHAZINE HCL 25 MG PO TABS: 25.0000 mg | ORAL_TABLET | Freq: Four times a day (QID) | ORAL | 1 refills | Status: DC | PRN

## 2019-04-14 ENCOUNTER — Other Ambulatory Visit: Payer: Self-pay | Admitting: Women's Health

## 2019-04-14 ENCOUNTER — Other Ambulatory Visit: Payer: Self-pay | Admitting: *Deleted

## 2019-04-14 NOTE — Telephone Encounter (Signed)
Requesting refill on Phenergan and diclofenac.

## 2019-04-14 NOTE — Addendum Note (Signed)
Addended by: Christiana Pellant A on: 04/14/2019 04:55 PM   Modules accepted: Orders

## 2019-04-15 ENCOUNTER — Other Ambulatory Visit: Payer: Self-pay | Admitting: Women's Health

## 2019-04-15 MED ORDER — DICLOFENAC SODIUM 75 MG PO TBEC: DELAYED_RELEASE_TABLET | ORAL | 0 refills | Status: DC

## 2019-04-29 ENCOUNTER — Other Ambulatory Visit: Payer: Self-pay | Admitting: Women's Health

## 2019-05-09 ENCOUNTER — Other Ambulatory Visit: Payer: Self-pay | Admitting: Women's Health

## 2019-05-12 ENCOUNTER — Other Ambulatory Visit: Payer: Self-pay | Admitting: Women's Health

## 2019-05-26 ENCOUNTER — Other Ambulatory Visit: Payer: Self-pay

## 2019-05-26 ENCOUNTER — Ambulatory Visit (INDEPENDENT_AMBULATORY_CARE_PROVIDER_SITE_OTHER): Payer: Medicare Other | Admitting: Adult Health

## 2019-05-26 ENCOUNTER — Encounter: Payer: Self-pay | Admitting: Adult Health

## 2019-05-26 VITALS — BP 135/92 | HR 93 | Ht 63.0 in | Wt 275.0 lb

## 2019-05-26 DIAGNOSIS — N946 Dysmenorrhea, unspecified: Secondary | ICD-10-CM

## 2019-05-26 DIAGNOSIS — N926 Irregular menstruation, unspecified: Secondary | ICD-10-CM | POA: Insufficient documentation

## 2019-05-26 DIAGNOSIS — Q513 Bicornate uterus: Secondary | ICD-10-CM

## 2019-05-26 MED ORDER — PROMETHAZINE HCL 25 MG PO TABS: ORAL_TABLET | ORAL | 3 refills | Status: DC

## 2019-05-26 MED ORDER — MEGESTROL ACETATE 40 MG PO TABS: ORAL_TABLET | ORAL | 7 refills | Status: DC

## 2019-05-26 MED ORDER — DICLOFENAC SODIUM 75 MG PO TBEC: DELAYED_RELEASE_TABLET | ORAL | 6 refills | Status: DC

## 2019-05-26 NOTE — Progress Notes (Signed)
  Subjective:     Patient ID: Pamela Ford, female   DOB: Nov 18, 1985, 33 y.o.   MRN: [id number]  HPI Pamela Ford is a 33 year old black female, G2P0111, in requesting refills on megace, Voltaren and phenergan. Her periods are heavy if not on megace and has cramping and sometimes nausea.She is not ready for surgery yet. PCP is Dr Legrand Rams.   Review of Systems Has cramping with periods, sometimes nausea if cramping bad Periods heavy if not on megace  Reviewed past medical,surgical, social and family history. Reviewed medications and allergies.     Objective:   Physical Exam BP (!) 135/92 (BP Location: Left Arm, Patient Position: Sitting, Cuff Size: Large)   Pulse 93   Ht 5\' 3"  (1.6 m)   Wt 275 lb (124.7 kg)   BMI 48.71 kg/m   Skin warm and dry.  Lungs: clear to ausculation bilaterally. Cardiovascular: regular rate and rhythm.    Assessment:     1. Abnormal menstrual periods   2. Dysmenorrhea   3. Bicornuate uterus       Plan:    Will refill meds  Meds ordered this encounter  Medications  . megestrol (MEGACE) 40 MG tablet    Sig: Take 2 daily to stop bleeding    Dispense:  60 tablet    Refill:  7    Order Specific Question:   Supervising Provider    Answer:   Elonda Husky, LUTHER H [[id number]]  . diclofenac (VOLTAREN) 75 MG EC tablet    Sig: Take 1 every 8 hours prn cramping    Dispense:  30 tablet    Refill:  6    Order Specific Question:   Supervising Provider    Answer:   Elonda Husky, LUTHER H [[id number]]  . promethazine (PHENERGAN) 25 MG tablet    Sig: TAKE 1 TABLET BY MOUTH EVERY 6 HOURS AS NEEDED FOR NAUSEA FOR VOMITING    Dispense:  30 tablet    Refill:  3    Order Specific Question:   Supervising Provider    Answer:   Florian Buff [[id number]]  Physical in June

## 2019-07-07 ENCOUNTER — Encounter: Payer: Self-pay | Admitting: Adult Health

## 2019-07-07 ENCOUNTER — Ambulatory Visit (INDEPENDENT_AMBULATORY_CARE_PROVIDER_SITE_OTHER): Payer: Medicare Other | Admitting: Adult Health

## 2019-07-07 ENCOUNTER — Other Ambulatory Visit: Payer: Self-pay

## 2019-07-07 VITALS — BP 145/97 | HR 104 | Ht 64.0 in | Wt 276.4 lb

## 2019-07-07 DIAGNOSIS — N926 Irregular menstruation, unspecified: Secondary | ICD-10-CM | POA: Diagnosis not present

## 2019-07-07 DIAGNOSIS — R110 Nausea: Secondary | ICD-10-CM | POA: Diagnosis not present

## 2019-07-07 DIAGNOSIS — R109 Unspecified abdominal pain: Secondary | ICD-10-CM | POA: Diagnosis not present

## 2019-07-07 DIAGNOSIS — Q513 Bicornate uterus: Secondary | ICD-10-CM | POA: Diagnosis not present

## 2019-07-07 MED ORDER — MEGESTROL ACETATE 40 MG PO TABS: ORAL_TABLET | ORAL | 7 refills | Status: DC

## 2019-07-07 MED ORDER — PROMETHAZINE HCL 25 MG PO TABS: ORAL_TABLET | ORAL | 3 refills | Status: DC

## 2019-07-07 MED ORDER — DICLOFENAC SODIUM 75 MG PO TBEC: DELAYED_RELEASE_TABLET | ORAL | 6 refills | Status: DC

## 2019-07-07 NOTE — Progress Notes (Signed)
  Subjective:     Patient ID: Pamela Ford, female   DOB: 1986/01/19, 33 y.o.   MRN: [id number]  HPI Temitope is a 33 year old black female, G2P0111, in requesting refills on meds esp phenergan. PCP is Dr Legrand Rams   Review of Systems Bleeding has stopped with megace but has cramping  Has nausea but no vomiting Reviewed past medical,surgical, social and family history. Reviewed medications and allergies.     Objective:   Physical Exam BP (!) 145/97 (BP Location: Left Wrist, Patient Position: Sitting, Cuff Size: Normal)   Pulse (!) 104   Ht 5\' 4"  (1.626 m)   Wt 276 lb 6.4 oz (125.4 kg)   BMI 47.44 kg/m  Skin warm and dry.  Lungs: clear to ausculation bilaterally. Cardiovascular: regular rate and rhythm.    Assessment:     1. Abnormal menstrual periods   2. Abdominal cramps   3. Bicornuate uterus   4. Nausea without vomiting       Plan:     Meds ordered this encounter  Medications  . megestrol (MEGACE) 40 MG tablet    Sig: Take 2 daily to stop bleeding    Dispense:  60 tablet    Refill:  7    Order Specific Question:   Supervising Provider    Answer:   Elonda Husky, LUTHER H [[id number]]  . diclofenac (VOLTAREN) 75 MG EC tablet    Sig: Take 1 every 8 hours prn cramping    Dispense:  30 tablet    Refill:  6    Order Specific Question:   Supervising Provider    Answer:   Elonda Husky, LUTHER H [[id number]]  . promethazine (PHENERGAN) 25 MG tablet    Sig: TAKE 1 TABLET BY MOUTH EVERY 6 HOURS AS NEEDED FOR NAUSEA FOR VOMITING    Dispense:  30 tablet    Refill:  3    Order Specific Question:   Supervising Provider    Answer:   Florian Buff [[id number]]  Return 01/21/20 for physical or sooner if needed

## 2019-12-08 ENCOUNTER — Ambulatory Visit: Payer: Medicare Other | Admitting: Orthopedic Surgery

## 2020-01-01 ENCOUNTER — Ambulatory Visit: Payer: Medicare Other | Admitting: Orthopedic Surgery

## 2020-02-03 ENCOUNTER — Ambulatory Visit: Payer: Medicare Other | Admitting: Adult Health

## 2020-02-09 DIAGNOSIS — K219 Gastro-esophageal reflux disease without esophagitis: Secondary | ICD-10-CM | POA: Diagnosis not present

## 2020-02-09 DIAGNOSIS — Z0001 Encounter for general adult medical examination with abnormal findings: Secondary | ICD-10-CM | POA: Diagnosis not present

## 2020-02-09 DIAGNOSIS — Z1389 Encounter for screening for other disorder: Secondary | ICD-10-CM | POA: Diagnosis not present

## 2020-02-09 DIAGNOSIS — Z79899 Other long term (current) drug therapy: Secondary | ICD-10-CM | POA: Diagnosis not present

## 2020-02-18 ENCOUNTER — Encounter: Payer: Self-pay | Admitting: Adult Health

## 2020-02-18 ENCOUNTER — Ambulatory Visit (INDEPENDENT_AMBULATORY_CARE_PROVIDER_SITE_OTHER): Payer: Medicare Other | Admitting: Adult Health

## 2020-02-18 VITALS — BP 126/82 | HR 109 | Ht 64.0 in | Wt 268.0 lb

## 2020-02-18 DIAGNOSIS — R110 Nausea: Secondary | ICD-10-CM

## 2020-02-18 DIAGNOSIS — N946 Dysmenorrhea, unspecified: Secondary | ICD-10-CM | POA: Diagnosis not present

## 2020-02-18 DIAGNOSIS — G8929 Other chronic pain: Secondary | ICD-10-CM

## 2020-02-18 DIAGNOSIS — N926 Irregular menstruation, unspecified: Secondary | ICD-10-CM | POA: Diagnosis not present

## 2020-02-18 DIAGNOSIS — M545 Low back pain, unspecified: Secondary | ICD-10-CM | POA: Insufficient documentation

## 2020-02-18 MED ORDER — DICLOFENAC SODIUM 75 MG PO TBEC: DELAYED_RELEASE_TABLET | ORAL | 6 refills | Status: DC

## 2020-02-18 MED ORDER — MEGESTROL ACETATE 40 MG PO TABS: ORAL_TABLET | ORAL | 12 refills | Status: DC

## 2020-02-18 MED ORDER — PROMETHAZINE HCL 25 MG PO TABS: ORAL_TABLET | ORAL | 3 refills | Status: DC

## 2020-02-18 MED ORDER — MELOXICAM 7.5 MG PO TABS: 7.5000 mg | ORAL_TABLET | Freq: Every day | ORAL | 3 refills | Status: DC

## 2020-02-18 MED ORDER — CYCLOBENZAPRINE HCL 5 MG PO TABS: 5.0000 mg | ORAL_TABLET | Freq: Three times a day (TID) | ORAL | 0 refills | Status: DC | PRN

## 2020-02-18 NOTE — Patient Instructions (Signed)

## 2020-02-18 NOTE — Progress Notes (Signed)
Subjective:     Patient ID: Pamela Ford, female   DOB: 1986/04/03, 34 y.o.   MRN: [id number]  HPI Pamela Ford is a 34 year old black female,married, [id number], in to get meds refilled for bleeding nausea and has chronic back pain.She says bleeding controlled on 1 megace. PCP is Dr Felecia Shelling   Review of Systems Bleeding controlled with 1 megace daily Has nausea at times Has low back pain, it is chronic, told PCP Reviewed past medical,surgical, social and family history. Reviewed medications and allergies.     Objective:   Physical Exam BP 126/82 (BP Location: Left Arm, Patient Position: Sitting, Cuff Size: Normal)   Pulse (!) 109   Ht 5\' 4"  (1.626 m)   Wt (!) 268 lb (121.6 kg)   BMI 46.00 kg/m  Skin warm and dry.  Lungs: clear to ausculation bilaterally. Cardiovascular: regular rate and rhythm. Fall risk Is low PHQ 9 score is 9, no SI on lexapro  Upstream - 02/18/20 02/20/20      Pregnancy Intention Screening   Does the patient want to become pregnant in the next year? No    Does the patient's partner want to become pregnant in the next year? No    Would the patient like to discuss contraceptive options today? No      Contraception Wrap Up   Contraception Counseling Provided No             Assessment:     1. Abnormal menstrual periods Take 1 megace daily to control bleeding  2. Nausea without vomiting Will refill phenergan  3. Dysmenorrhea Has Voltaren prn cramps  4. Chronic bilateral low back pain without sciatica Refilled mobic and will rx flexeril Alternate heat and ice and review handout on back pain  Meds ordered this encounter  Medications  . diclofenac (VOLTAREN) 75 MG EC tablet    Sig: Take 1 every 8 hours prn cramping    Dispense:  30 tablet    Refill:  6    Order Specific Question:   Supervising Provider    Answer:   1740, LUTHER H [[id number]]  . meloxicam (MOBIC) 7.5 MG tablet    Sig: Take 1 tablet (7.5 mg total) by mouth daily.    Dispense:  30 tablet     Refill:  3    Order Specific Question:   Supervising Provider    Answer:   Despina Hidden, LUTHER H [[id number]]  . megestrol (MEGACE) 40 MG tablet    Sig: Take 1 daily    Dispense:  30 tablet    Refill:  12    Order Specific Question:   Supervising Provider    Answer:   Despina Hidden, LUTHER H [[id number]]  . cyclobenzaprine (FLEXERIL) 5 MG tablet    Sig: Take 1 tablet (5 mg total) by mouth 3 (three) times daily as needed for muscle spasms.    Dispense:  30 tablet    Refill:  0    Order Specific Question:   Supervising Provider    Answer:   Despina Hidden, LUTHER H [[id number]]  . promethazine (PHENERGAN) 25 MG tablet    Sig: TAKE 1 TABLET BY MOUTH EVERY 6 HOURS AS NEEDED FOR NAUSEA FOR VOMITING    Dispense:  30 tablet    Refill:  3    Order Specific Question:   Supervising Provider    Answer:   Despina Hidden [[id number]]      Plan:     Follow up in 5 months for pap and  physical

## 2020-04-08 ENCOUNTER — Ambulatory Visit: Payer: Medicare Other | Admitting: Adult Health

## 2020-08-19 ENCOUNTER — Other Ambulatory Visit: Payer: Medicare Other | Admitting: Adult Health

## 2020-09-10 ENCOUNTER — Ambulatory Visit: Payer: Medicare Other | Admitting: Adult Health

## 2020-10-13 ENCOUNTER — Ambulatory Visit (INDEPENDENT_AMBULATORY_CARE_PROVIDER_SITE_OTHER): Payer: Medicare Other | Admitting: Adult Health

## 2020-10-13 ENCOUNTER — Encounter: Payer: Self-pay | Admitting: Adult Health

## 2020-10-13 ENCOUNTER — Other Ambulatory Visit: Payer: Self-pay

## 2020-10-13 ENCOUNTER — Other Ambulatory Visit (HOSPITAL_COMMUNITY)
Admission: RE | Admit: 2020-10-13 | Discharge: 2020-10-13 | Disposition: A | Discharge status: Home / Self Care | Payer: Medicare Other | Source: Ambulatory Visit | Attending: Adult Health | Admitting: Adult Health

## 2020-10-13 VITALS — BP 123/77 | HR 122 | Ht 64.0 in | Wt 252.2 lb

## 2020-10-13 DIAGNOSIS — Z01419 Encounter for gynecological examination (general) (routine) without abnormal findings: Secondary | ICD-10-CM | POA: Diagnosis not present

## 2020-10-13 DIAGNOSIS — Z113 Encounter for screening for infections with a predominantly sexual mode of transmission: Secondary | ICD-10-CM | POA: Diagnosis not present

## 2020-10-13 DIAGNOSIS — N946 Dysmenorrhea, unspecified: Secondary | ICD-10-CM | POA: Diagnosis not present

## 2020-10-13 DIAGNOSIS — Z01411 Encounter for gynecological examination (general) (routine) with abnormal findings: Secondary | ICD-10-CM | POA: Insufficient documentation

## 2020-10-13 DIAGNOSIS — B373 Candidiasis of vulva and vagina: Secondary | ICD-10-CM | POA: Insufficient documentation

## 2020-10-13 DIAGNOSIS — Q513 Bicornate uterus: Secondary | ICD-10-CM

## 2020-10-13 MED ORDER — DICLOFENAC SODIUM 75 MG PO TBEC: DELAYED_RELEASE_TABLET | ORAL | 6 refills | Status: DC

## 2020-10-13 MED ORDER — PROMETHAZINE HCL 25 MG PO TABS: ORAL_TABLET | ORAL | 3 refills | Status: DC

## 2020-10-13 MED ORDER — MEGESTROL ACETATE 40 MG PO TABS: ORAL_TABLET | ORAL | 6 refills | Status: DC

## 2020-10-13 NOTE — Progress Notes (Signed)
Patient ID: Pamela Ford, female   DOB: [birth year], 35 y.o.   MRN: [id number] History of Present Illness: Pamela Ford is a 35 year old black female,single, G2P011, in for a well woman gyn exam, she had a normal pap with negative HR HPV 01/20/19. She has painful periods and uses megace and voltaren, which helps. PCP is Dr Felecia Shelling.  Current Medications, Allergies, Past Medical History, Past Surgical History, Family History and Social History were reviewed in [employer] record.     Review of Systems: Patient denies any headaches, hearing loss, fatigue, blurred vision, shortness of breath, chest pain, abdominal pain, problems with bowel movements, urination, or intercourse.(not active) No joint pain or mood swings. +painful periods   Physical Exam:BP 123/77 (BP Location: Right Arm, Patient Position: Sitting, Cuff Size: Large)   Pulse (!) 122   Ht 5\' 4"  (1.626 m)   Wt 252 lb 3.2 oz (114.4 kg)   LMP 09/17/2020 (Approximate)   BMI 43.29 kg/m  General:  Well developed, well nourished, no acute distress Skin:  Warm and dry Neck:  Midline trachea, normal thyroid, good ROM, no lymphadenopathy Lungs; Clear to auscultation bilaterally Breast:  No dominant palpable mass, retraction, or nipple discharge Cardiovascular: Regular rate and rhythm Abdomen:  Soft, non tender, no hepatosplenomegaly Pelvic:  External genitalia is normal in appearance, no lesions.  The vagina is normal in appearance. Urethra has no lesions or masses. The cervix is bulbous.CV swab obtained.  Uterus is felt to be normal size, shape, and contour.  No adnexal masses or tenderness noted.Bladder is non tender, no masses felt. Extremities/musculoskeletal:  No swelling or varicosities noted, no clubbing or cyanosis Psych:  No mood changes, alert and cooperative,seems happy AA is 0  Fall risk is low PHQ 9 score is 0 GAD 7 score is 0  Upstream - 10/13/20 1441      Pregnancy Intention Screening   Does the patient  want to become pregnant in the next year? No    Does the patient's partner want to become pregnant in the next year? No    Would the patient like to discuss contraceptive options today? No      Contraception Wrap Up   Current Method Abstinence    End Method Abstinence    Contraception Counseling Provided No         Examination chaperoned by 10/15/20.  Impression and Plan: 1. Encounter for well woman exam with routine gynecological exam Physical in 1 year Pap in 2023 Declined COVID vaccine and flu shot    2. Screening examination for STD (sexually transmitted disease) CV swab sent Check HIV, RPR and Hepatitis C antibody  3. Dysmenorrhea Meds ordered this encounter  Medications  . diclofenac (VOLTAREN) 75 MG EC tablet    Sig: Take 1 every 8 hours prn cramping    Dispense:  30 tablet    Refill:  6    Order Specific Question:   Supervising Provider    Answer:   2024, LUTHER H [[id number]]  . promethazine (PHENERGAN) 25 MG tablet    Sig: TAKE 1 TABLET BY MOUTH EVERY 6 HOURS AS NEEDED FOR NAUSEA FOR VOMITING    Dispense:  30 tablet    Refill:  3    Order Specific Question:   Supervising Provider    Answer:   Despina Hidden, LUTHER H [[id number]]  . megestrol (MEGACE) 40 MG tablet    Sig: Take 1 daily    Dispense:  30 tablet    Refill:  6    Order Specific Question:   Supervising Provider    Answer:   Despina Hidden, LUTHER H [[id number]]    4. Bicornuate uterus

## 2020-10-15 ENCOUNTER — Telehealth: Payer: Self-pay | Admitting: Adult Health

## 2020-10-15 LAB — CERVICOVAGINAL ANCILLARY ONLY
Bacterial Vaginitis (gardnerella): NEGATIVE
Candida Glabrata: NEGATIVE
Candida Vaginitis: POSITIVE — AB
Chlamydia: NEGATIVE
Comment: NEGATIVE
Comment: NEGATIVE
Comment: NEGATIVE
Comment: NEGATIVE
Comment: NEGATIVE
Comment: NORMAL
Neisseria Gonorrhea: NEGATIVE
Trichomonas: NEGATIVE

## 2020-10-15 MED ORDER — FLUCONAZOLE 150 MG PO TABS: ORAL_TABLET | ORAL | 1 refills | Status: DC

## 2020-10-15 NOTE — Telephone Encounter (Signed)
Pt informed +yeast on CV swab will rx diflucan

## 2020-10-18 ENCOUNTER — Other Ambulatory Visit (HOSPITAL_COMMUNITY)
Admission: RE | Admit: 2020-10-18 | Discharge: 2020-10-18 | Disposition: A | Discharge status: Home / Self Care | Payer: Medicare Other | Source: Ambulatory Visit | Attending: Adult Health | Admitting: Adult Health

## 2020-10-18 DIAGNOSIS — Z113 Encounter for screening for infections with a predominantly sexual mode of transmission: Secondary | ICD-10-CM | POA: Diagnosis present

## 2020-10-18 DIAGNOSIS — Z114 Encounter for screening for human immunodeficiency virus [HIV]: Secondary | ICD-10-CM | POA: Insufficient documentation

## 2020-10-18 LAB — HIV ANTIBODY (ROUTINE TESTING W REFLEX): HIV Screen 4th Generation wRfx: NONREACTIVE

## 2020-10-18 LAB — HEPATITIS C ANTIBODY: HCV Ab: NONREACTIVE

## 2020-10-19 LAB — RPR: RPR Ser Ql: NONREACTIVE

## 2021-01-27 ENCOUNTER — Other Ambulatory Visit: Payer: Self-pay

## 2021-01-27 ENCOUNTER — Encounter: Payer: Self-pay | Admitting: Adult Health

## 2021-01-27 ENCOUNTER — Ambulatory Visit (INDEPENDENT_AMBULATORY_CARE_PROVIDER_SITE_OTHER): Payer: Medicare Other | Admitting: Adult Health

## 2021-01-27 VITALS — BP 122/88 | HR 107 | Ht 64.0 in | Wt 241.0 lb

## 2021-01-27 DIAGNOSIS — N926 Irregular menstruation, unspecified: Secondary | ICD-10-CM | POA: Diagnosis not present

## 2021-01-27 DIAGNOSIS — N946 Dysmenorrhea, unspecified: Secondary | ICD-10-CM | POA: Diagnosis not present

## 2021-01-27 DIAGNOSIS — R110 Nausea: Secondary | ICD-10-CM

## 2021-01-27 MED ORDER — DICLOFENAC SODIUM 75 MG PO TBEC: DELAYED_RELEASE_TABLET | ORAL | 6 refills | Status: DC

## 2021-01-27 MED ORDER — MEGESTROL ACETATE 40 MG PO TABS: ORAL_TABLET | ORAL | 12 refills | Status: DC

## 2021-01-27 MED ORDER — PROMETHAZINE HCL 25 MG PO TABS: ORAL_TABLET | ORAL | 3 refills | Status: DC

## 2021-01-27 NOTE — Progress Notes (Signed)
  Subjective:     Patient ID: Pamela Ford, female   DOB: 05/05/1986, 35 y.o.   MRN: [id number]  HPI [name] is a 35 year old black female,single, [id number], in for refills on Voltaren,megace and phenergan, she has painful periods these help. Lab Results  Component Value Date   DIAGPAP  01/20/2019    NEGATIVE FOR INTRAEPITHELIAL LESIONS OR MALIGNANCY.   HPV NOT DETECTED 01/20/2019   PCP is Dr Felecia Shelling.  Review of Systems Painful periods Periods heavy at times if does not take megace Has nausea at times Not sexually active.  Reviewed past medical,surgical, social and family history. Reviewed medications and allergies.     Objective:   Physical Exam BP 122/88 (BP Location: Left Arm, Patient Position: Sitting, Cuff Size: Large)   Pulse (!) 107   Ht 5\' 4"  (1.626 m)   Wt 241 lb (109.3 kg)   LMP 12/28/2020 (Approximate)   BMI 41.37 kg/m  Skin warm and dry. Lungs: clear to ausculation bilaterally. Cardiovascular: regular rate and rhythm.    Fall risk is low  Upstream - 01/27/21 1555       Pregnancy Intention Screening   Does the patient want to become pregnant in the next year? No    Does the patient's partner want to become pregnant in the next year? No    Would the patient like to discuss contraceptive options today? No      Contraception Wrap Up   Current Method Abstinence    End Method Abstinence    Contraception Counseling Provided No             Assessment:     1. Dysmenorrhea Will refill Voltaren   2. Nausea without vomiting Will refill phenergan  3. Abnormal menstrual periods Will refill megace  Meds ordered this encounter  Medications   diclofenac (VOLTAREN) 75 MG EC tablet    Sig: Take 1 every 8 hours prn cramping    Dispense:  30 tablet    Refill:  6    Order Specific Question:   Supervising Provider    Answer:   03/30/21 H [[id number]]   megestrol (MEGACE) 40 MG tablet    Sig: Take 1 daily    Dispense:  30 tablet    Refill:  12    Order Specific  Question:   Supervising Provider    Answer:   Duane Lope H [[id number]]   promethazine (PHENERGAN) 25 MG tablet    Sig: TAKE 1 TABLET BY MOUTH EVERY 6 HOURS AS NEEDED FOR NAUSEA FOR VOMITING    Dispense:  30 tablet    Refill:  3    Order Specific Question:   Supervising Provider    Answer:   Duane Lope [[id number]]       Plan:     Follow up in March for pap and physical

## 2021-02-14 ENCOUNTER — Other Ambulatory Visit (HOSPITAL_COMMUNITY)
Admission: RE | Admit: 2021-02-14 | Discharge: 2021-02-14 | Disposition: A | Discharge status: Home / Self Care | Payer: Medicare Other | Source: Ambulatory Visit | Attending: Internal Medicine | Admitting: Internal Medicine

## 2021-02-14 DIAGNOSIS — Z0001 Encounter for general adult medical examination with abnormal findings: Secondary | ICD-10-CM | POA: Insufficient documentation

## 2021-02-14 DIAGNOSIS — K219 Gastro-esophageal reflux disease without esophagitis: Secondary | ICD-10-CM | POA: Insufficient documentation

## 2021-02-14 DIAGNOSIS — Z79899 Other long term (current) drug therapy: Secondary | ICD-10-CM | POA: Diagnosis not present

## 2021-02-14 DIAGNOSIS — F17210 Nicotine dependence, cigarettes, uncomplicated: Secondary | ICD-10-CM | POA: Diagnosis not present

## 2021-02-14 DIAGNOSIS — Z1389 Encounter for screening for other disorder: Secondary | ICD-10-CM | POA: Diagnosis not present

## 2021-02-14 DIAGNOSIS — E1165 Type 2 diabetes mellitus with hyperglycemia: Secondary | ICD-10-CM | POA: Insufficient documentation

## 2021-02-14 LAB — CBC WITH DIFFERENTIAL/PLATELET
Abs Immature Granulocytes: 0.03 10*3/uL (ref 0.00–0.07)
Basophils Absolute: 0.1 10*3/uL (ref 0.0–0.1)
Basophils Relative: 1 %
Eosinophils Absolute: 0.5 10*3/uL (ref 0.0–0.5)
Eosinophils Relative: 8 %
HCT: 42.3 % (ref 36.0–46.0)
Hemoglobin: 14.1 g/dL (ref 12.0–15.0)
Immature Granulocytes: 0 %
Lymphocytes Relative: 36 %
Lymphs Abs: 2.6 10*3/uL (ref 0.7–4.0)
MCH: 32 pg (ref 26.0–34.0)
MCHC: 33.3 g/dL (ref 30.0–36.0)
MCV: 95.9 fL (ref 80.0–100.0)
Monocytes Absolute: 0.4 10*3/uL (ref 0.1–1.0)
Monocytes Relative: 6 %
Neutro Abs: 3.5 10*3/uL (ref 1.7–7.7)
Neutrophils Relative %: 49 %
Platelets: 327 10*3/uL (ref 150–400)
RBC: 4.41 MIL/uL (ref 3.87–5.11)
RDW: 12.8 % (ref 11.5–15.5)
WBC: 7.1 10*3/uL (ref 4.0–10.5)
nRBC: 0 % (ref 0.0–0.2)

## 2021-02-14 LAB — HEPATIC FUNCTION PANEL
ALT: 24 U/L (ref 0–44)
AST: 22 U/L (ref 15–41)
Albumin: 3.6 g/dL (ref 3.5–5.0)
Alkaline Phosphatase: 78 U/L (ref 38–126)
Bilirubin, Direct: 0.1 mg/dL (ref 0.0–0.2)
Indirect Bilirubin: 0.3 mg/dL (ref 0.3–0.9)
Total Bilirubin: 0.4 mg/dL (ref 0.3–1.2)
Total Protein: 7.8 g/dL (ref 6.5–8.1)

## 2021-02-14 LAB — BASIC METABOLIC PANEL
Anion gap: 7 (ref 5–15)
BUN: 9 mg/dL (ref 6–20)
CO2: 23 mmol/L (ref 22–32)
Calcium: 9.3 mg/dL (ref 8.9–10.3)
Chloride: 103 mmol/L (ref 98–111)
Creatinine, Ser: 0.82 mg/dL (ref 0.44–1.00)
GFR, Estimated: >60 mL/min (ref >60)
Glucose, Bld: 290 mg/dL — ABNORMAL HIGH (ref 70–99)
Potassium: 3.9 mmol/L (ref 3.5–5.1)
Sodium: 133 mmol/L — ABNORMAL LOW (ref 135–145)

## 2021-02-14 LAB — LIPID PANEL
Cholesterol: 140 mg/dL (ref 0–200)
HDL: 30 mg/dL — ABNORMAL LOW (ref >40)
LDL Cholesterol: 93 mg/dL (ref 0–99)
Total CHOL/HDL Ratio: 4.7 RATIO
Triglycerides: 87 mg/dL (ref <150)
VLDL: 17 mg/dL (ref 0–40)

## 2021-02-15 LAB — HEMOGLOBIN A1C
Hgb A1c MFr Bld: 12.6 % — ABNORMAL HIGH (ref 4.8–5.6)
Mean Plasma Glucose: 315 mg/dL

## 2021-02-15 LAB — MICROALBUMIN / CREATININE URINE RATIO
Creatinine, Urine: 60.5 mg/dL
Microalb Creat Ratio: 7 mg/g creat (ref 0–29)
Microalb, Ur: 4 ug/mL — ABNORMAL HIGH

## 2021-02-16 DIAGNOSIS — E1165 Type 2 diabetes mellitus with hyperglycemia: Secondary | ICD-10-CM | POA: Diagnosis not present

## 2021-02-16 DIAGNOSIS — K219 Gastro-esophageal reflux disease without esophagitis: Secondary | ICD-10-CM | POA: Diagnosis not present

## 2021-03-07 ENCOUNTER — Emergency Department (HOSPITAL_COMMUNITY): Payer: Medicare Other

## 2021-03-07 ENCOUNTER — Emergency Department (HOSPITAL_COMMUNITY)
Admission: EM | Admit: 2021-03-07 | Discharge: 2021-03-07 | Disposition: A | Discharge status: Home / Self Care | Payer: Medicare Other | Attending: Emergency Medicine | Admitting: Emergency Medicine

## 2021-03-07 DIAGNOSIS — Y900 Blood alcohol level of less than 20 mg/100 ml: Secondary | ICD-10-CM | POA: Insufficient documentation

## 2021-03-07 DIAGNOSIS — R739 Hyperglycemia, unspecified: Secondary | ICD-10-CM | POA: Diagnosis not present

## 2021-03-07 DIAGNOSIS — R079 Chest pain, unspecified: Secondary | ICD-10-CM | POA: Diagnosis not present

## 2021-03-07 DIAGNOSIS — I213 ST elevation (STEMI) myocardial infarction of unspecified site: Secondary | ICD-10-CM | POA: Diagnosis not present

## 2021-03-07 DIAGNOSIS — R0789 Other chest pain: Secondary | ICD-10-CM | POA: Diagnosis not present

## 2021-03-07 DIAGNOSIS — Z5321 Procedure and treatment not carried out due to patient leaving prior to being seen by health care provider: Secondary | ICD-10-CM | POA: Diagnosis not present

## 2021-03-07 DIAGNOSIS — I4510 Unspecified right bundle-branch block: Secondary | ICD-10-CM | POA: Diagnosis not present

## 2021-03-07 LAB — COMPREHENSIVE METABOLIC PANEL
ALT: 24 U/L (ref 0–44)
AST: 24 U/L (ref 15–41)
Albumin: 3.8 g/dL (ref 3.5–5.0)
Alkaline Phosphatase: 90 U/L (ref 38–126)
Anion gap: 10 (ref 5–15)
BUN: 8 mg/dL (ref 6–20)
CO2: 20 mmol/L — ABNORMAL LOW (ref 22–32)
Calcium: 9.2 mg/dL (ref 8.9–10.3)
Chloride: 101 mmol/L (ref 98–111)
Creatinine, Ser: 0.85 mg/dL (ref 0.44–1.00)
GFR, Estimated: >60 mL/min (ref >60)
Glucose, Bld: 417 mg/dL — ABNORMAL HIGH (ref 70–99)
Potassium: 3.8 mmol/L (ref 3.5–5.1)
Sodium: 131 mmol/L — ABNORMAL LOW (ref 135–145)
Total Bilirubin: 0.5 mg/dL (ref 0.3–1.2)
Total Protein: 8 g/dL (ref 6.5–8.1)

## 2021-03-07 LAB — CBC WITH DIFFERENTIAL/PLATELET
Abs Immature Granulocytes: 0.02 10*3/uL (ref 0.00–0.07)
Basophils Absolute: 0.1 10*3/uL (ref 0.0–0.1)
Basophils Relative: 1 %
Eosinophils Absolute: 0.3 10*3/uL (ref 0.0–0.5)
Eosinophils Relative: 5 %
HCT: 41.8 % (ref 36.0–46.0)
Hemoglobin: 14.1 g/dL (ref 12.0–15.0)
Immature Granulocytes: 0 %
Lymphocytes Relative: 37 %
Lymphs Abs: 2.5 10*3/uL (ref 0.7–4.0)
MCH: 31.8 pg (ref 26.0–34.0)
MCHC: 33.7 g/dL (ref 30.0–36.0)
MCV: 94.1 fL (ref 80.0–100.0)
Monocytes Absolute: 0.4 10*3/uL (ref 0.1–1.0)
Monocytes Relative: 6 %
Neutro Abs: 3.5 10*3/uL (ref 1.7–7.7)
Neutrophils Relative %: 51 %
Platelets: 240 10*3/uL (ref 150–400)
RBC: 4.44 MIL/uL (ref 3.87–5.11)
RDW: 13.2 % (ref 11.5–15.5)
WBC: 6.8 10*3/uL (ref 4.0–10.5)
nRBC: 0 % (ref 0.0–0.2)

## 2021-03-07 LAB — SALICYLATE LEVEL: Salicylate Lvl: <7 mg/dL — ABNORMAL LOW (ref 7.0–30.0)

## 2021-03-07 LAB — ACETAMINOPHEN LEVEL: Acetaminophen (Tylenol), Serum: <10 ug/mL — ABNORMAL LOW (ref 10–30)

## 2021-03-07 LAB — ETHANOL: Alcohol, Ethyl (B): <10 mg/dL (ref <10)

## 2021-03-07 LAB — TROPONIN I (HIGH SENSITIVITY): Troponin I (High Sensitivity): 3 ng/L (ref <18)

## 2021-03-07 LAB — LIPASE, BLOOD: Lipase: 31 U/L (ref 11–51)

## 2021-03-07 NOTE — ED Triage Notes (Addendum)
Per EMS pt. Was walking around outside when they became tired. Pt. Had complaints of chest pain  for 2 weeks. When pt. Entered the triage room pt. Stated " I am going to be honest with you, no one believes me. Someone out of state is messing with me. They are draining all my blood. I feel tired."  " I feel suicidal." Pt. States they attempted yesterday. When asked how they attempted pt stated " I hit my head." This nurse asked pt. If they meant to hit their head pt. Stated "No." Pt. Began to talk about people from [place] listening to their phone and having control over their finances and taking their blood. Pt. States they are SI but pt. Can not say what their plan is just that they have attempted.

## 2021-05-13 ENCOUNTER — Ambulatory Visit (INDEPENDENT_AMBULATORY_CARE_PROVIDER_SITE_OTHER): Payer: Medicare Other | Admitting: Adult Health

## 2021-05-13 ENCOUNTER — Encounter: Payer: Self-pay | Admitting: Adult Health

## 2021-05-13 ENCOUNTER — Other Ambulatory Visit: Payer: Self-pay

## 2021-05-13 VITALS — BP 122/86 | HR 101 | Ht 63.0 in | Wt 231.0 lb

## 2021-05-13 DIAGNOSIS — Q513 Bicornate uterus: Secondary | ICD-10-CM

## 2021-05-13 DIAGNOSIS — N926 Irregular menstruation, unspecified: Secondary | ICD-10-CM | POA: Diagnosis not present

## 2021-05-13 DIAGNOSIS — N946 Dysmenorrhea, unspecified: Secondary | ICD-10-CM

## 2021-05-13 DIAGNOSIS — R110 Nausea: Secondary | ICD-10-CM | POA: Diagnosis not present

## 2021-05-13 MED ORDER — MEGESTROL ACETATE 40 MG PO TABS
ORAL_TABLET | ORAL | 12 refills | Status: DC
Start: 2021-05-13 — End: 2022-03-07

## 2021-05-13 MED ORDER — DICLOFENAC SODIUM 75 MG PO TBEC: DELAYED_RELEASE_TABLET | ORAL | 6 refills | Status: DC

## 2021-05-13 MED ORDER — PROMETHAZINE HCL 25 MG PO TABS: ORAL_TABLET | ORAL | 3 refills | Status: DC

## 2021-05-13 NOTE — Progress Notes (Signed)
  Subjective:     Patient ID: Pamela Ford, female   DOB: Dec 17, 1985, 35 y.o.   MRN: [id number]  HPI Von is a 35 year old year old black female,single, G2P0111, in to get refills on megace,Voltaren and phenergan. She says she is doing good.  PCP is Dr Felecia Shelling.   Lab Results  Component Value Date   DIAGPAP  01/20/2019    NEGATIVE FOR INTRAEPITHELIAL LESIONS OR MALIGNANCY.   HPV NOT DETECTED 01/20/2019    Review of Systems Periods good on megace,no bleeding  Has nausea and cramps at times but not has often Reviewed past medical,surgical, social and family history. Reviewed medications and allergies.     Objective:   Physical Exam BP 122/86 (BP Location: Left Arm, Patient Position: Sitting, Cuff Size: Large)   Pulse (!) 101   Ht 5\' 3"  (1.6 m)   Wt 231 lb (104.8 kg)   BMI 40.92 kg/m     Skin warm and dry. . Lungs: clear to ausculation bilaterally. Cardiovascular: regular rate and rhythm.  Fall risk I slow  Upstream - 05/13/21 1106       Pregnancy Intention Screening   Does the patient want to become pregnant in the next year? No    Does the patient's partner want to become pregnant in the next year? No    Would the patient like to discuss contraceptive options today? No      Contraception Wrap Up   Current Method Abstinence    End Method Abstinence    Contraception Counseling Provided No             Assessment:      1. Dysmenorrhea Periods good on megace, will refill megace and voltaren  Meds ordered this encounter  Medications   diclofenac (VOLTAREN) 75 MG EC tablet    Sig: Take 1 every 8 hours prn cramping    Dispense:  30 tablet    Refill:  6    Order Specific Question:   Supervising Provider    Answer:   05/15/21 H [[id number]]   megestrol (MEGACE) 40 MG tablet    Sig: Take 1 daily    Dispense:  30 tablet    Refill:  12    Order Specific Question:   Supervising Provider    Answer:   Duane Lope H [[id number]]   promethazine (PHENERGAN) 25 MG tablet    Sig:  TAKE 1 TABLET BY MOUTH EVERY 6 HOURS AS NEEDED FOR NAUSEA FOR VOMITING    Dispense:  30 tablet    Refill:  3    Order Specific Question:   Supervising Provider    Answer:   Duane Lope, LUTHER H [[id number]]    2. Bicornuate uterus  3. Nausea without vomiting Requests refill on phenergan  4. Abnormal menstrual periods No bleeding on megace, will continue  Plan:     Return in 6 months for pap and physical Labs with PCP

## 2021-12-22 ENCOUNTER — Other Ambulatory Visit: Payer: Medicare Other | Admitting: Adult Health

## 2021-12-28 ENCOUNTER — Other Ambulatory Visit: Payer: Self-pay

## 2021-12-28 ENCOUNTER — Encounter (HOSPITAL_COMMUNITY): Payer: Self-pay | Admitting: Emergency Medicine

## 2021-12-28 ENCOUNTER — Emergency Department (HOSPITAL_COMMUNITY)
Admission: EM | Admit: 2021-12-28 | Discharge: 2021-12-28 | Disposition: A | Discharge status: Home / Self Care | Payer: Medicare Other | Attending: Emergency Medicine | Admitting: Emergency Medicine

## 2021-12-28 DIAGNOSIS — R000 Tachycardia, unspecified: Secondary | ICD-10-CM | POA: Diagnosis not present

## 2021-12-28 DIAGNOSIS — K5900 Constipation, unspecified: Secondary | ICD-10-CM | POA: Diagnosis not present

## 2021-12-28 DIAGNOSIS — R109 Unspecified abdominal pain: Secondary | ICD-10-CM | POA: Diagnosis not present

## 2021-12-28 DIAGNOSIS — Z5321 Procedure and treatment not carried out due to patient leaving prior to being seen by health care provider: Secondary | ICD-10-CM | POA: Insufficient documentation

## 2021-12-28 DIAGNOSIS — R450 Nervousness: Secondary | ICD-10-CM | POA: Diagnosis not present

## 2021-12-28 DIAGNOSIS — I959 Hypotension, unspecified: Secondary | ICD-10-CM | POA: Diagnosis not present

## 2021-12-28 NOTE — ED Triage Notes (Signed)
Pt arrives via [facility] for abdominal pain and constipation x 3 days.

## 2022-02-01 ENCOUNTER — Encounter: Payer: Self-pay | Admitting: Adult Health

## 2022-02-01 ENCOUNTER — Ambulatory Visit (INDEPENDENT_AMBULATORY_CARE_PROVIDER_SITE_OTHER): Payer: Medicare Other | Admitting: Adult Health

## 2022-02-01 VITALS — BP 150/100 | HR 110 | Ht 62.75 in | Wt 213.0 lb

## 2022-02-01 DIAGNOSIS — R079 Chest pain, unspecified: Secondary | ICD-10-CM | POA: Diagnosis not present

## 2022-02-01 DIAGNOSIS — Z0000 Encounter for general adult medical examination without abnormal findings: Secondary | ICD-10-CM

## 2022-02-01 DIAGNOSIS — I10 Essential (primary) hypertension: Secondary | ICD-10-CM | POA: Diagnosis not present

## 2022-02-01 MED ORDER — ASPIRIN 81 MG PO TBEC
325.0000 mg | DELAYED_RELEASE_TABLET | Freq: Every day | ORAL | Status: AC
  Administered 2022-02-01: 325 mg via ORAL

## 2022-02-01 NOTE — Addendum Note (Signed)
Addended by: Colen Darling on: 02/01/2022 03:29 PM   Modules accepted: Orders

## 2022-02-01 NOTE — Progress Notes (Signed)
Patient ID: Pamela Ford, female   DOB: 10-28-1985, 36 y.o.   MRN: [id number] History of Present Illness: Pamela Ford is a 36 years old black female,single, G2P0111, in for a well woman gyn exam and pap. She says she is having chest pain and has not felt well in about 2 weeks, not sure of anxiety or not. BP ie elvated and she is not on meds.  PCP is Dr Felecia Shelling   Current Medications, Allergies, Past Medical History, Past Surgical History, Family History and Social History were reviewed in [employer] electronic medical record.     Review of Systems: Patient denies any headaches, hearing loss, fatigue, blurred vision, shortness of breath, abdominal pain, problems with bowel movements, urination, or intercourse(not active). No joint pain or mood swings. On megace to control periods and cramping  See HPI for positives.    Physical Exam:BP (!) 150/100 (BP Location: Left Arm, Cuff Size: Large)   Pulse (!) 110   Ht 5' 2.75" (1.594 m)   Wt 213 lb (96.6 kg)   BMI 38.03 kg/m   General:  Well developed, well nourished, no acute distress Skin:  Warm and dry Neck:  Midline trachea, normal thyroid, good ROM, no lymphadenopathy Lungs; Clear to auscultation bilaterally Breast:  No dominant palpable mass, retraction, or nipple discharge Cardiovascular: Regular rate and rhythm Abdomen:  Soft, non tender, no hepatosplenomegaly Pelvic:  Deferred she says chest hurts, given 4 81 mg ASA and she dressed, she refused EMS transport, says mom will take her to ER Rectal:Deferred  Extremities/musculoskeletal:  No swelling or varicosities noted, no clubbing or cyanosis Psych:  No mood changes, alert and cooperative,seems happy AA is low Fall risk is low    02/01/2022    1:35 PM 10/13/2020    2:42 PM 02/18/2020    8:52 AM  Depression screen PHQ 2/9  Decreased Interest 0 0 3  Down, Depressed, Hopeless 0 0 2  PHQ - 2 Score 0 0 5  Altered sleeping 0 0 0  Tired, decreased energy 0 0 1  Change in appetite 0 0 3   Feeling bad or failure about yourself  0 0 0  Trouble concentrating 0 0 0  Moving slowly or fidgety/restless 0 0 0  Suicidal thoughts 0 0 0  PHQ-9 Score 0 0 9       02/01/2022    1:35 PM 10/13/2020    2:42 PM 02/18/2020    8:53 AM  GAD 7 : Generalized Anxiety Score  Nervous, Anxious, on Edge 0 0 2  Control/stop worrying 0 0 0  Worry too much - different things 0 0 0  Trouble relaxing 0 0 1  Restless 0 0 3  Easily annoyed or irritable 0 0 3  Afraid - awful might happen 0 0 1  Total GAD 7 Score 0 0 10      Upstream - 02/01/22 1340       Pregnancy Intention Screening   Does the patient want to become pregnant in the next year? No    Does the patient's partner want to become pregnant in the next year? No    Would the patient like to discuss contraceptive options today? No      Contraception Wrap Up   Current Method Abstinence    End Method Abstinence             Examination chaperoned by Malachy Mood LPN  Impression and Plan: 1. Chest pain, unspecified type PT started having chest pain on  exam table, -gave her 4  81 mg ASA She refused EMS transport, said her mom would take her, and was asked several time and she refused  Marylu Lund took her to her mom's car by wheelchair and mom said she would take her to [facility] which is just across the street ER at [facility] called about her arrival   2. Hypertension, unspecified type She is aware BP is elevated and needs to be on medication   3. Routine general medical examination at a health care facility She will reschedule physical and pap appt.

## 2022-03-01 ENCOUNTER — Ambulatory Visit: Payer: Medicare Other | Admitting: Adult Health

## 2022-03-07 ENCOUNTER — Ambulatory Visit (INDEPENDENT_AMBULATORY_CARE_PROVIDER_SITE_OTHER): Payer: Medicare Other | Admitting: Adult Health

## 2022-03-07 ENCOUNTER — Encounter: Payer: Self-pay | Admitting: Adult Health

## 2022-03-07 VITALS — BP 145/96 | HR 104 | Ht 64.0 in | Wt 213.0 lb

## 2022-03-07 DIAGNOSIS — N946 Dysmenorrhea, unspecified: Secondary | ICD-10-CM

## 2022-03-07 DIAGNOSIS — N926 Irregular menstruation, unspecified: Secondary | ICD-10-CM | POA: Diagnosis not present

## 2022-03-07 DIAGNOSIS — Q513 Bicornate uterus: Secondary | ICD-10-CM

## 2022-03-07 DIAGNOSIS — I10 Essential (primary) hypertension: Secondary | ICD-10-CM

## 2022-03-07 MED ORDER — PROMETHAZINE HCL 25 MG PO TABS: ORAL_TABLET | ORAL | 3 refills | Status: DC

## 2022-03-07 MED ORDER — DICLOFENAC SODIUM 75 MG PO TBEC: DELAYED_RELEASE_TABLET | ORAL | 6 refills | Status: AC

## 2022-03-07 MED ORDER — MEGESTROL ACETATE 40 MG PO TABS: ORAL_TABLET | ORAL | 12 refills | Status: DC

## 2022-03-07 MED ORDER — AMLODIPINE BESYLATE 5 MG PO TABS: 5.0000 mg | ORAL_TABLET | Freq: Every day | ORAL | 3 refills | Status: DC

## 2022-03-07 NOTE — Progress Notes (Signed)
Subjective:     Patient ID: Pamela Ford, female   DOB: 1986/02/11, 36 y.o.   MRN: [id number]  HPI Pamela Ford is a 36 year old black female,single, G2P0111,in requesting refills on megace,Voltaren and phenergan.  She has not seen Dr Felecia Shelling about her BP yet. She was here in July for pap and started having chest pain and was to go to ER but did not.  PCP is Dr Felecia Shelling Lab Results  Component Value Date   DIAGPAP  01/20/2019    NEGATIVE FOR INTRAEPITHELIAL LESIONS OR MALIGNANCY.   HPV NOT DETECTED 01/20/2019    Review of Systems Has painful periods and irregular periods if not on megace May still spot Has nausea sometimes if cramping  She is not having sex Reviewed past medical,surgical, social and family history. Reviewed medications and allergies.     Objective:   Physical Exam BP (!) 145/96 (BP Location: Right Arm, Patient Position: Sitting, Cuff Size: Large)   Pulse (!) 104   Ht 5\' 4"  (1.626 m)   Wt 213 lb (96.6 kg)   BMI 36.56 kg/m     Skin warm and dry.Lungs: clear to ausculation bilaterally. Cardiovascular: regular rate and rhythm.   Upstream - 03/07/22 1045       Pregnancy Intention Screening   Does the patient want to become pregnant in the next year? No    Does the patient's partner want to become pregnant in the next year? No    Would the patient like to discuss contraceptive options today? No      Contraception Wrap Up   Current Method Abstinence    End Method Abstinence             Assessment:     1. Dysmenorrhea Will continue megace, discussed with Dr 03/09/22 She declines stopping for now Will refill Voltaren and phenergan  Meds ordered this encounter  Medications   diclofenac (VOLTAREN) 75 MG EC tablet    Sig: Take 1 every 8 hours prn cramping    Dispense:  30 tablet    Refill:  6    Order Specific Question:   Supervising Provider    Answer:   Despina Hidden [[id number]]   megestrol (MEGACE) 40 MG tablet    Sig: Take 1 daily    Dispense:  30 tablet     Refill:  12    Order Specific Question:   Supervising Provider    Answer:   Lazaro Arms H [[id number]]   promethazine (PHENERGAN) 25 MG tablet    Sig: TAKE 1 TABLET BY MOUTH EVERY 6 HOURS AS NEEDED FOR NAUSEA FOR VOMITING    Dispense:  30 tablet    Refill:  3    Order Specific Question:   Supervising Provider    Answer:   Duane Lope H [[id number]]   amLODipine (NORVASC) 5 MG tablet    Sig: Take 1 tablet (5 mg total) by mouth daily.    Dispense:  30 tablet    Refill:  3    Order Specific Question:   Supervising Provider    Answer:   Duane Lope, LUTHER H [[id number]]     2. Abnormal menstrual periods Continue megace for now   3. Bicornuate uterus  4. Hypertension, unspecified type Will start Norvasc 5 mg Watch salt and sugars Review DASH diet Follow up with Dr Despina Hidden, she says she will    Will recheck BP in 4 weeks  Plan:     Return in 4 weeks for pap  and BP check

## 2022-04-04 ENCOUNTER — Ambulatory Visit: Payer: Medicare Other | Admitting: Adult Health

## 2022-09-15 ENCOUNTER — Other Ambulatory Visit: Payer: Self-pay | Admitting: Adult Health

## 2022-09-21 ENCOUNTER — Encounter: Payer: Self-pay | Admitting: Radiology

## 2023-02-05 ENCOUNTER — Ambulatory Visit (INDEPENDENT_AMBULATORY_CARE_PROVIDER_SITE_OTHER): Payer: 59 | Admitting: Adult Health

## 2023-02-05 ENCOUNTER — Other Ambulatory Visit (HOSPITAL_COMMUNITY)
Admission: RE | Admit: 2023-02-05 | Discharge: 2023-02-05 | Disposition: A | Discharge status: Home / Self Care | Payer: 59 | Source: Ambulatory Visit | Attending: Adult Health | Admitting: Adult Health

## 2023-02-05 ENCOUNTER — Encounter: Payer: Self-pay | Admitting: Adult Health

## 2023-02-05 VITALS — BP 146/103 | HR 107 | Ht 63.0 in | Wt 197.5 lb

## 2023-02-05 DIAGNOSIS — F17200 Nicotine dependence, unspecified, uncomplicated: Secondary | ICD-10-CM

## 2023-02-05 DIAGNOSIS — Z13220 Encounter for screening for lipoid disorders: Secondary | ICD-10-CM | POA: Diagnosis not present

## 2023-02-05 DIAGNOSIS — Z1151 Encounter for screening for human papillomavirus (HPV): Secondary | ICD-10-CM | POA: Insufficient documentation

## 2023-02-05 DIAGNOSIS — I10 Essential (primary) hypertension: Secondary | ICD-10-CM | POA: Diagnosis not present

## 2023-02-05 DIAGNOSIS — Z131 Encounter for screening for diabetes mellitus: Secondary | ICD-10-CM

## 2023-02-05 DIAGNOSIS — Z01419 Encounter for gynecological examination (general) (routine) without abnormal findings: Secondary | ICD-10-CM | POA: Insufficient documentation

## 2023-02-05 DIAGNOSIS — N946 Dysmenorrhea, unspecified: Secondary | ICD-10-CM | POA: Diagnosis not present

## 2023-02-05 DIAGNOSIS — L900 Lichen sclerosus et atrophicus: Secondary | ICD-10-CM

## 2023-02-05 DIAGNOSIS — Q513 Bicornate uterus: Secondary | ICD-10-CM

## 2023-02-05 DIAGNOSIS — Z1329 Encounter for screening for other suspected endocrine disorder: Secondary | ICD-10-CM | POA: Insufficient documentation

## 2023-02-05 DIAGNOSIS — N926 Irregular menstruation, unspecified: Secondary | ICD-10-CM | POA: Diagnosis not present

## 2023-02-05 MED ORDER — CLOBETASOL PROPIONATE 0.05 % EX OINT: TOPICAL_OINTMENT | CUTANEOUS | 3 refills | Status: AC

## 2023-02-05 MED ORDER — AMLODIPINE BESYLATE 10 MG PO TABS: 10.0000 mg | ORAL_TABLET | Freq: Every day | ORAL | 6 refills | Status: DC

## 2023-02-05 MED ORDER — NORETHINDRONE 0.35 MG PO TABS: 1.0000 | ORAL_TABLET | Freq: Every day | ORAL | 11 refills | Status: DC

## 2023-02-05 NOTE — Progress Notes (Signed)
Patient ID: Pamela Ford, female   DOB: March 31, 1986, 37 y.o.   MRN: [id number] History of Present Illness: Pamela Ford is a 37 year old black female,single, G2P0111, in for a well woman gyn exam and pap. Has bleeding occasionally on megace.  PCP is Dr Felecia Shelling   Current Medications, Allergies, Past Medical History, Past Surgical History, Family History and Social History were reviewed in [employer] electronic medical record.     Review of Systems: Patient denies any headaches, hearing loss, fatigue, blurred vision, shortness of breath, chest pain, abdominal pain, problems with bowel movements, urination, or intercourse(not active). No joint pain or mood swings.  Bleeding occasionally on megace    Physical Exam:BP (!) 146/103 (BP Location: Right Arm, Patient Position: Sitting, Cuff Size: Normal)   Pulse (!) 107   Ht 5\' 3"  (1.6 m)   Wt 197 lb 8 oz (89.6 kg)   LMP 01/25/2023 (Approximate) Comment: takes Megace  BMI 34.99 kg/m   she has lost 16 lbs since last visit. General:  Well developed, well nourished, no acute distress Skin:  Warm and dry Neck:  Midline trachea, normal thyroid, good ROM, no lymphadenopathy Lungs; Clear to auscultation bilaterally Breast:  No dominant palpable mass, retraction, or nipple discharge Cardiovascular: Regular rate and rhythm Abdomen:  Soft, non tender, no hepatosplenomegaly Pelvic:  External genitalia has white thickened skin vulva to rectal area, with a fissure on right.  The vagina is normal in appearance. Urethra has no lesions or masses. The cervix is smooth,pap with HR HPV genotyping performed.  Uterus is felt to be normal size, and contour, has bicornuate uterus.  No adnexal masses or tenderness noted.Bladder is non tender, no masses felt. Extremities/musculoskeletal:  No swelling or varicosities noted, no clubbing or cyanosis Psych:  No mood changes, alert and cooperative,seems happy AA is 0 Fall risk is low    02/05/2023    2:23 PM 02/01/2022     1:35 PM 10/13/2020    2:42 PM  Depression screen PHQ 2/9  Decreased Interest 0 0 0  Down, Depressed, Hopeless 0 0 0  PHQ - 2 Score 0 0 0  Altered sleeping 0 0 0  Tired, decreased energy 0 0 0  Change in appetite 0 0 0  Feeling bad or failure about yourself  0 0 0  Trouble concentrating 0 0 0  Moving slowly or fidgety/restless 0 0 0  Suicidal thoughts 0 0 0  PHQ-9 Score 0 0 0       02/05/2023    2:23 PM 02/01/2022    1:35 PM 10/13/2020    2:42 PM 02/18/2020    8:53 AM  GAD 7 : Generalized Anxiety Score  Nervous, Anxious, on Edge 0 0 0 2  Control/stop worrying 0 0 0 0  Worry too much - different things 0 0 0 0  Trouble relaxing 0 0 0 1  Restless 0 0 0 3  Easily annoyed or irritable 0 0 0 3  Afraid - awful might happen 0 0 0 1  Total GAD 7 Score 0 0 0 10      Upstream - 02/05/23 1435       Pregnancy Intention Screening   Does the patient want to become pregnant in the next year? No    Does the patient's partner want to become pregnant in the next year? No    Would the patient like to discuss contraceptive options today? No      Contraception Wrap Up   Current Method  Abstinence    End Method Abstinence             Examination chaperoned by Malachy Mood LPN  Impression and Plan: 1. Encounter for gynecological examination with Papanicolaou smear of cervix Pap sent Pap in 3 years if normal Physical in 1 year Will check labs - Cytology - PAP( [facility]) - CBC - Comprehensive metabolic panel - Lipid panel  2. Dysmenorrhea Stop megace Will rx Micronor  3. Abnormal menstrual periods Will stop megace and try Micronor Will Follow up in 8 weeks for ROS   Meds ordered this encounter  Medications   amLODipine (NORVASC) 10 MG tablet    Sig: Take 1 tablet (10 mg total) by mouth daily.    Dispense:  30 tablet    Refill:  6    Order Specific Question:   Supervising Provider    Answer:   Duane Lope H [[id number]]   norethindrone (MICRONOR) 0.35 MG tablet    Sig: Take  1 tablet (0.35 mg total) by mouth daily.    Dispense:  28 tablet    Refill:  11    Order Specific Question:   Supervising Provider    Answer:   Duane Lope H [[id number]]   clobetasol ointment (TEMOVATE) 0.05 %    Sig: Use bid to external affected tissue for 4 weeks then go to 2-3 x weekly    Dispense:  30 g    Refill:  3    Order Specific Question:   Supervising Provider    Answer:   Despina Hidden, LUTHER H [[id number]]     4. Bicornuate uterus  5. Hypertension, unspecified type Will increase norvasc to 10 mg  Watch salt and sugars - Comprehensive metabolic panel Will recheck BP in 8 weeks   6. Smoker She is trying to cut down and quit   7. Screening for diabetes mellitus - Hemoglobin A1c  8. Screening cholesterol level - Lipid panel  9. Screening for thyroid disorder  - TSH + free T4  10. Lichen sclerosus +white thickened skin vulva to rectal area Has had some itching she says  Will rx temovate Will recheck in 8 weeks

## 2023-02-06 LAB — CBC
Hematocrit: 44.1 % (ref 34.0–46.6)
Hemoglobin: 15 g/dL (ref 11.1–15.9)
MCH: 31.5 pg (ref 26.6–33.0)
MCHC: 34 g/dL (ref 31.5–35.7)
MCV: 93 fL (ref 79–97)
Platelets: 251 10*3/uL (ref 150–450)
RBC: 4.76 x10E6/uL (ref 3.77–5.28)
RDW: 12.6 % (ref 11.7–15.4)
WBC: 8 10*3/uL (ref 3.4–10.8)

## 2023-02-06 LAB — LIPID PANEL
Chol/HDL Ratio: 4.1 ratio (ref 0.0–4.4)
Cholesterol, Total: 141 mg/dL (ref 100–199)
HDL: 34 mg/dL — ABNORMAL LOW (ref >39)
LDL Chol Calc (NIH): 86 mg/dL (ref 0–99)
Triglycerides: 112 mg/dL (ref 0–149)
VLDL Cholesterol Cal: 21 mg/dL (ref 5–40)

## 2023-02-06 LAB — COMPREHENSIVE METABOLIC PANEL
ALT: 13 IU/L (ref 0–32)
AST: 15 IU/L (ref 0–40)
Albumin: 4.6 g/dL (ref 3.9–4.9)
Alkaline Phosphatase: 112 IU/L (ref 44–121)
BUN/Creatinine Ratio: 7 — ABNORMAL LOW (ref 9–23)
BUN: 6 mg/dL (ref 6–20)
Bilirubin Total: 0.5 mg/dL (ref 0.0–1.2)
CO2: 21 mmol/L (ref 20–29)
Calcium: 10.4 mg/dL — ABNORMAL HIGH (ref 8.7–10.2)
Chloride: 98 mmol/L (ref 96–106)
Creatinine, Ser: 0.84 mg/dL (ref 0.57–1.00)
Globulin, Total: 3 g/dL (ref 1.5–4.5)
Glucose: 314 mg/dL — ABNORMAL HIGH (ref 70–99)
Potassium: 4 mmol/L (ref 3.5–5.2)
Sodium: 136 mmol/L (ref 134–144)
Total Protein: 7.6 g/dL (ref 6.0–8.5)
eGFR: 93 mL/min/{1.73_m2} (ref >59)

## 2023-02-06 LAB — HEMOGLOBIN A1C
Est. average glucose Bld gHb Est-mCnc: 369 mg/dL
Hgb A1c MFr Bld: 14.5 % — ABNORMAL HIGH (ref 4.8–5.6)

## 2023-02-06 LAB — TSH+FREE T4
Free T4: 1.48 ng/dL (ref 0.82–1.77)
TSH: 1.58 u[IU]/mL (ref 0.450–4.500)

## 2023-02-07 ENCOUNTER — Other Ambulatory Visit: Payer: Self-pay | Admitting: Adult Health

## 2023-02-07 ENCOUNTER — Encounter: Payer: Self-pay | Admitting: Adult Health

## 2023-02-07 ENCOUNTER — Telehealth: Payer: Self-pay | Admitting: *Deleted

## 2023-02-07 DIAGNOSIS — E119 Type 2 diabetes mellitus without complications: Secondary | ICD-10-CM

## 2023-02-07 NOTE — Telephone Encounter (Signed)
-----   Message from [place] sent at 02/07/2023  1:51 PM EDT ----- Will you call her and make sure she is aware of elevated A1c 14.5 so diabetic, and calcium level up slightly and that I have place referral for her to see Dr Fransico Him, they should call her

## 2023-02-07 NOTE — Telephone Encounter (Signed)
Call can't be completed @ 4:52 pm. JSY

## 2023-02-08 ENCOUNTER — Other Ambulatory Visit: Payer: Self-pay | Admitting: Adult Health

## 2023-02-08 LAB — CYTOLOGY - PAP
Chlamydia: NEGATIVE
Comment: NEGATIVE
Comment: NEGATIVE
Comment: NORMAL
Diagnosis: NEGATIVE
High risk HPV: NEGATIVE
Neisseria Gonorrhea: NEGATIVE

## 2023-02-08 MED ORDER — FLUCONAZOLE 150 MG PO TABS: ORAL_TABLET | ORAL | 1 refills | Status: DC

## 2023-02-08 NOTE — Telephone Encounter (Signed)
Call can't be completed @ 5:01 pm. JSY

## 2023-02-12 NOTE — Telephone Encounter (Signed)
Call can't be completed @ 9:35 am. Will send certified letter for pt to call office. JSY

## 2023-02-23 NOTE — Telephone Encounter (Signed)
Call can't be completed at 11:40 am. JSY

## 2023-02-26 NOTE — Telephone Encounter (Signed)
Multiple attempts to reach pt. Certified letter sent also. Closing encounter. JSY

## 2023-03-06 ENCOUNTER — Encounter: Payer: Self-pay | Admitting: Adult Health

## 2023-03-25 ENCOUNTER — Other Ambulatory Visit: Payer: Self-pay | Admitting: Adult Health

## 2023-04-02 ENCOUNTER — Ambulatory Visit (INDEPENDENT_AMBULATORY_CARE_PROVIDER_SITE_OTHER): Payer: 59 | Admitting: Adult Health

## 2023-04-02 ENCOUNTER — Encounter: Payer: Self-pay | Admitting: Adult Health

## 2023-04-02 VITALS — BP 144/102 | HR 107 | Ht 63.0 in | Wt 197.0 lb

## 2023-04-02 DIAGNOSIS — E119 Type 2 diabetes mellitus without complications: Secondary | ICD-10-CM

## 2023-04-02 DIAGNOSIS — L900 Lichen sclerosus et atrophicus: Secondary | ICD-10-CM | POA: Diagnosis not present

## 2023-04-02 DIAGNOSIS — I10 Essential (primary) hypertension: Secondary | ICD-10-CM

## 2023-04-02 DIAGNOSIS — N926 Irregular menstruation, unspecified: Secondary | ICD-10-CM | POA: Diagnosis not present

## 2023-04-02 MED ORDER — LISINOPRIL 10 MG PO TABS: 10.0000 mg | ORAL_TABLET | Freq: Every day | ORAL | 3 refills | Status: DC

## 2023-04-02 MED ORDER — NORETHINDRONE 0.35 MG PO TABS: 1.0000 | ORAL_TABLET | Freq: Every day | ORAL | 11 refills | Status: DC

## 2023-04-02 MED ORDER — METFORMIN HCL 500 MG PO TABS: 500.0000 mg | ORAL_TABLET | Freq: Two times a day (BID) | ORAL | 3 refills | Status: DC

## 2023-04-02 NOTE — Progress Notes (Addendum)
Subjective:     Patient ID: Pamela Ford, female   DOB: Aug 26, 1985, 37 y.o.   MRN: [id number]  HPI Pamela Ford is a 37 year old black female,single, [id number], in for follow up on BP, lichen sclerosus and taking Micronor. Periods Ok has some cramping, no itching.  She just received lab results from [place], A1c was 14,5 02/05/23 and she has not seen Dr Fransico Him, her phone number changed, and Marylu Lund sent certified letter.     Component Value Date/Time   DIAGPAP  02/05/2023 1438    - Negative for intraepithelial lesion or malignancy (NILM)   DIAGPAP  01/20/2019 0000    NEGATIVE FOR INTRAEPITHELIAL LESIONS OR MALIGNANCY.   HPVHIGH Negative 02/05/2023 1438   ADEQPAP  02/05/2023 1438    Satisfactory for evaluation; transformation zone component PRESENT.   ADEQPAP  01/20/2019 0000    Satisfactory for evaluation  endocervical/transformation zone component PRESENT.    PCP is Dr Felecia Shelling  Review of Systems Period OK, has some cramping No itching Reviewed past medical,surgical, social and family history. Reviewed medications and allergies.     Objective:   Physical Exam BP (!) 144/102 (BP Location: Right Arm, Patient Position: Sitting, Cuff Size: Normal)   Pulse (!) 107   Ht 5\' 3"  (1.6 m)   Wt 197 lb (89.4 kg)   LMP 03/20/2023 (Approximate)   BMI 34.90 kg/m     Skin warm and dry. Lungs: clear to ausculation bilaterally. Cardiovascular: regular rate and rhythm.  Pelvic: external genitalia has white thickened skin from vulva to rectal area.  Fall risk is low  Upstream - 04/02/23 1343       Pregnancy Intention Screening   Does the patient want to become pregnant in the next year? No    Does the patient's partner want to become pregnant in the next year? No    Would the patient like to discuss contraceptive options today? No      Contraception Wrap Up   Current Method Abstinence;Oral Contraceptive    End Method Abstinence;Oral Contraceptive    Contraception Counseling Provided Yes    How was  the end contraceptive method provided? Prescription            Examination chaperoned by Malachy Mood LPN  Assessment:     1. Hypertension, unspecified type Continue Norvasc 10 mg(has refills) and will add lisinopril 10 mg 1 daily  Watch salt   Meds ordered this encounter  Medications   norethindrone (MICRONOR) 0.35 MG tablet    Sig: Take 1 tablet (0.35 mg total) by mouth daily.    Dispense:  28 tablet    Refill:  11    Order Specific Question:   Supervising Provider    Answer:   Duane Lope H [[id number]]   metFORMIN (GLUCOPHAGE) 500 MG tablet    Sig: Take 1 tablet (500 mg total) by mouth 2 (two) times daily with a meal.    Dispense:  60 tablet    Refill:  3    Order Specific Question:   Supervising Provider    Answer:   Despina Hidden, LUTHER H [[id number]]   lisinopril (ZESTRIL) 10 MG tablet    Sig: Take 1 tablet (10 mg total) by mouth daily.    Dispense:  30 tablet    Refill:  3    Order Specific Question:   Supervising Provider    Answer:   Despina Hidden, LUTHER H [[id number]]   Recheck in 6 weeks   2. Lichen sclerosus Still white and thickened  skin, no itching She has not been using 2 x weekly  Continue temovate 2 x weekly, has RF  3. Type 2 diabetes mellitus without complication, without long-term current use of insulin (HCC) Will rx metformin 500 mg 1 bid and resend referral to Dr Fransico Him Decrease sweets and carbs  Valla Leaver and left message at Dr Isidoro Donning office  New phone number is [phone]  4. Abnormal menstrual periods Period OK with micronor, but cramping some Will refill micronor     Plan:     Follow up in 6 weeks for BP check

## 2023-05-14 ENCOUNTER — Ambulatory Visit: Payer: 59 | Admitting: Adult Health

## 2023-09-24 ENCOUNTER — Encounter: Payer: Self-pay | Admitting: Adult Health

## 2023-09-24 ENCOUNTER — Ambulatory Visit (INDEPENDENT_AMBULATORY_CARE_PROVIDER_SITE_OTHER): Payer: 59 | Admitting: Adult Health

## 2023-09-24 VITALS — BP 166/114 | HR 89 | Ht 63.5 in | Wt 196.0 lb

## 2023-09-24 DIAGNOSIS — F17200 Nicotine dependence, unspecified, uncomplicated: Secondary | ICD-10-CM | POA: Diagnosis not present

## 2023-09-24 DIAGNOSIS — I10 Essential (primary) hypertension: Secondary | ICD-10-CM

## 2023-09-24 DIAGNOSIS — E119 Type 2 diabetes mellitus without complications: Secondary | ICD-10-CM | POA: Diagnosis not present

## 2023-09-24 MED ORDER — LISINOPRIL 20 MG PO TABS: 20.0000 mg | ORAL_TABLET | Freq: Every day | ORAL | 6 refills | Status: AC

## 2023-09-24 MED ORDER — AMLODIPINE BESYLATE 10 MG PO TABS: 10.0000 mg | ORAL_TABLET | Freq: Every day | ORAL | 6 refills | Status: AC

## 2023-09-24 MED ORDER — METFORMIN HCL 500 MG PO TABS: 500.0000 mg | ORAL_TABLET | Freq: Two times a day (BID) | ORAL | 6 refills | Status: AC

## 2023-09-24 NOTE — Progress Notes (Addendum)
 Subjective:     Patient ID: Pamela Ford, female   DOB: 1986-05-13, 38 y.o.   MRN: [id number]  HPI Selyna is a 38 year old black female,single, [id number], in for follow up on BP.     Component Value Date/Time   DIAGPAP  02/05/2023 1438    - Negative for intraepithelial lesion or malignancy (NILM)   DIAGPAP  01/20/2019 0000    NEGATIVE FOR INTRAEPITHELIAL LESIONS OR MALIGNANCY.   HPVHIGH Negative 02/05/2023 1438   ADEQPAP  02/05/2023 1438    Satisfactory for evaluation; transformation zone component PRESENT.   ADEQPAP  01/20/2019 0000    Satisfactory for evaluation  endocervical/transformation zone component PRESENT.   PCP is Dr Felecia Shelling  Review of Systems Denies daily headaches Periods good on Micronor Not having sex Reviewed past medical,surgical, social and family history. Reviewed medications and allergies.     Objective:   Physical Exam BP (!) 166/114 (BP Location: Left Arm, Patient Position: Sitting, Cuff Size: Normal)   Pulse 89   Ht 5' 3.5" (1.613 m)   Wt 196 lb (88.9 kg)   BMI 34.18 kg/m     Skin warm and dry.  Lungs: clear to ausculation bilaterally. Cardiovascular: regular rate and rhythm.  Fall risk is low  Upstream - 09/24/23 1408       Pregnancy Intention Screening   Does the patient want to become pregnant in the next year? No    Does the patient's partner want to become pregnant in the next year? No    Would the patient like to discuss contraceptive options today? No      Contraception Wrap Up   Current Method Abstinence;Oral Contraceptive    End Method Abstinence;Oral Contraceptive    Contraception Counseling Provided Yes             Assessment:     1. Hypertension, unspecified type (Primary) She says she is taking BP meds,not sure she is taking daily by reading today  Will refill Norvasc 10 mg and increase lisinopril to 20 mg 1 daily   Meds ordered this encounter  Medications   amLODipine (NORVASC) 10 MG tablet    Sig: Take 1 tablet (10 mg  total) by mouth daily.    Dispense:  30 tablet    Refill:  6    Supervising Provider:   Duane Lope H [[id number]]   lisinopril (ZESTRIL) 20 MG tablet    Sig: Take 1 tablet (20 mg total) by mouth daily.    Dispense:  30 tablet    Refill:  6    Supervising Provider:   Duane Lope H [[id number]]   metFORMIN (GLUCOPHAGE) 500 MG tablet    Sig: Take 1 tablet (500 mg total) by mouth 2 (two) times daily with a meal.    Dispense:  60 tablet    Refill:  6    Supervising Provider:   Lazaro Arms [[id number]]    - Comprehensive metabolic panel Recheck BP in July Follow up with Dr Felecia Shelling   2. Type 2 diabetes mellitus without complication, without long-term current use of insulin (HCC) Refilled metformin 500 mg 1 bid Will check CMP and A1c - Comprehensive metabolic panel - Hemoglobin A1c - Ambulatory referral to Endocrinology She says she never heard from Dr Isidoro Donning office will refer again  3. Smoker A pack lasts 3 days, not ready to quit    Plan:    Follow up with Dr Felecia Shelling  Return in July for physical

## 2023-09-25 LAB — COMPREHENSIVE METABOLIC PANEL
ALT: 11 IU/L (ref 0–32)
AST: 13 IU/L (ref 0–40)
Albumin: 4.1 g/dL (ref 3.9–4.9)
Alkaline Phosphatase: 124 IU/L — ABNORMAL HIGH (ref 44–121)
BUN/Creatinine Ratio: 6 — ABNORMAL LOW (ref 9–23)
BUN: 4 mg/dL — ABNORMAL LOW (ref 6–20)
Bilirubin Total: 0.6 mg/dL (ref 0.0–1.2)
CO2: 24 mmol/L (ref 20–29)
Calcium: 9.6 mg/dL (ref 8.7–10.2)
Chloride: 101 mmol/L (ref 96–106)
Creatinine, Ser: 0.69 mg/dL (ref 0.57–1.00)
Globulin, Total: 3 g/dL (ref 1.5–4.5)
Glucose: 212 mg/dL — ABNORMAL HIGH (ref 70–99)
Potassium: 4 mmol/L (ref 3.5–5.2)
Sodium: 139 mmol/L (ref 134–144)
Total Protein: 7.1 g/dL (ref 6.0–8.5)
eGFR: 116 mL/min/{1.73_m2} (ref >59)

## 2023-09-25 LAB — HEMOGLOBIN A1C
Est. average glucose Bld gHb Est-mCnc: 263 mg/dL
Hgb A1c MFr Bld: 10.8 % — ABNORMAL HIGH (ref 4.8–5.6)

## 2023-10-23 ENCOUNTER — Ambulatory Visit: Admitting: Adult Health

## 2023-10-23 ENCOUNTER — Encounter: Payer: Self-pay | Admitting: Adult Health

## 2023-10-23 VITALS — BP 160/110 | HR 92 | Ht 64.0 in | Wt 195.0 lb

## 2023-10-23 DIAGNOSIS — Z7689 Persons encountering health services in other specified circumstances: Secondary | ICD-10-CM | POA: Insufficient documentation

## 2023-10-23 DIAGNOSIS — I10 Essential (primary) hypertension: Secondary | ICD-10-CM | POA: Diagnosis not present

## 2023-10-23 MED ORDER — NORETHINDRONE 0.35 MG PO TABS: 1.0000 | ORAL_TABLET | Freq: Every day | ORAL | 11 refills | Status: DC

## 2023-10-23 MED ORDER — CHLORTHALIDONE 25 MG PO TABS: 25.0000 mg | ORAL_TABLET | Freq: Every day | ORAL | 6 refills | Status: AC

## 2023-10-23 NOTE — Progress Notes (Signed)
  Subjective:     Patient ID: Pamela Ford, female   DOB: Jul 09, 1986, 38 y.o.   MRN: [id number]  HPI Pamela Ford is a 38 year old black female,single, [id number], in requesting refills on Micronor, and BP check.  She has appt with endocrinology in June.    Component Value Date/Time   DIAGPAP  02/05/2023 1438    - Negative for intraepithelial lesion or malignancy (NILM)   DIAGPAP  01/20/2019 0000    NEGATIVE FOR INTRAEPITHELIAL LESIONS OR MALIGNANCY.   HPVHIGH Negative 02/05/2023 1438   ADEQPAP  02/05/2023 1438    Satisfactory for evaluation; transformation zone component PRESENT.   ADEQPAP  01/20/2019 0000    Satisfactory for evaluation  endocervical/transformation zone component PRESENT.   PCP is Dr Felecia Shelling  Review of Systems Denies any headaches She says she is taking BP meds Periods good on Micronor Reviewed past medical,surgical, social and family history. Reviewed medications and allergies.     Objective:   Physical Exam BP (!) 160/110 (BP Location: Left Arm, Patient Position: Sitting, Cuff Size: Normal)   Pulse 92   Ht 5\' 4"  (1.626 m)   Wt 195 lb (88.5 kg)   LMP 10/05/2023 (Approximate)   BMI 33.47 kg/m     Skin warm and dry.  Lungs: clear to ausculation bilaterally. Cardiovascular: regular rate and rhythm.  Fall risk is low  Upstream - 10/23/23 1418       Pregnancy Intention Screening   Does the patient want to become pregnant in the next year? No    Does the patient's partner want to become pregnant in the next year? No    Would the patient like to discuss contraceptive options today? No      Contraception Wrap Up   Current Method Oral Contraceptive    End Method Oral Contraceptive    Contraception Counseling Provided Yes             Assessment:     1. Hypertension, unspecified type (Primary) She is taking norvasc 10 mg and lisinopril 20 mg daily she says  Will add chorothalidone 25 mg 1 daily Meds ordered this encounter  Medications   norethindrone  (MICRONOR) 0.35 MG tablet    Sig: Take 1 tablet (0.35 mg total) by mouth daily.    Dispense:  28 tablet    Refill:  11    Supervising Provider:   Duane Lope H [[id number]]   chlorthalidone (HYGROTON) 25 MG tablet    Sig: Take 1 tablet (25 mg total) by mouth daily.    Dispense:  30 tablet    Refill:  6    Supervising Provider:   Duane Lope H [[id number]]    Follow up with PCP 2. Encounter for menstrual regulation Refilled Micronor 1 daily     Plan:     Follow up 11/21/23 for BP check with me

## 2023-11-21 ENCOUNTER — Ambulatory Visit: Admitting: Adult Health

## 2023-11-21 ENCOUNTER — Encounter: Payer: Self-pay | Admitting: Adult Health

## 2023-11-21 VITALS — BP 142/90 | HR 92 | Ht 64.0 in | Wt 195.0 lb

## 2023-11-21 DIAGNOSIS — I10 Essential (primary) hypertension: Secondary | ICD-10-CM | POA: Diagnosis not present

## 2023-11-21 NOTE — Progress Notes (Signed)
  Subjective:     Patient ID: Pamela Ford, female   DOB: [birth year], 38 y.o.   MRN: [id number]  HPI Pamela Ford is a 38 year old black female, single, [id number] in for BP check, feels good.      Component Value Date/Time   DIAGPAP  02/05/2023 1438    - Negative for intraepithelial lesion or malignancy (NILM)   DIAGPAP  01/20/2019 0000    NEGATIVE FOR INTRAEPITHELIAL LESIONS OR MALIGNANCY.   HPVHIGH Negative 02/05/2023 1438   ADEQPAP  02/05/2023 1438    Satisfactory for evaluation; transformation zone component PRESENT.   ADEQPAP  01/20/2019 0000    Satisfactory for evaluation  endocervical/transformation zone component PRESENT.    PCP is Dr Eldon Greenland  Review of Systems Denies any headaches or chest pain Reviewed past medical,surgical, social and family history. Reviewed medications and allergies.     Objective:   Physical Exam BP (!) 142/90 (BP Location: Right Arm, Patient Position: Sitting, Cuff Size: Normal)   Pulse 92   Ht 5\' 4"  (1.626 m)   Wt 195 lb (88.5 kg)   LMP 10/23/2023 (Approximate)   BMI 33.47 kg/m     Skin warm and dry. Lungs: clear to ausculation bilaterally. Cardiovascular: regular rate and rhythm.   Upstream - 11/21/23 1503       Pregnancy Intention Screening   Does the patient want to become pregnant in the next year? No    Does the patient's partner want to become pregnant in the next year? No    Would the patient like to discuss contraceptive options today? No      Contraception Wrap Up   Current Method Oral Contraceptive    End Method Oral Contraceptive    Contraception Counseling Provided Yes             Assessment:     1. Hypertension, unspecified type (Primary) BP is better, continue Norvasc  10 mg, lisinopril  20 mg and hygroton  25 mg 1 daily, has refills    Follow up in 3 months for ROS and BP Plan:    Keep appt with endocrinology  Follow up in 3 months for ROS and BP check

## 2024-01-07 NOTE — Patient Instructions (Incomplete)

## 2024-01-08 ENCOUNTER — Ambulatory Visit: Admitting: Nurse Practitioner

## 2024-01-08 DIAGNOSIS — E782 Mixed hyperlipidemia: Secondary | ICD-10-CM

## 2024-01-08 DIAGNOSIS — E1165 Type 2 diabetes mellitus with hyperglycemia: Secondary | ICD-10-CM

## 2024-01-08 DIAGNOSIS — I10 Essential (primary) hypertension: Secondary | ICD-10-CM

## 2024-01-08 DIAGNOSIS — Z7984 Long term (current) use of oral hypoglycemic drugs: Secondary | ICD-10-CM

## 2024-01-08 NOTE — Progress Notes (Deleted)
 Endocrinology Consult Note       01/08/2024, 7:00 AM   Subjective:    Patient ID: Pamela Ford, female    DOB: 05-20-1986.  Pamela Ford is being seen in consultation for management of currently uncontrolled symptomatic diabetes requested by  Wyvonna Heidelberg, MD.   Past Medical History:  Diagnosis Date   Acid reflux    Anxiety    Bicornuate uterus    BV (bacterial vaginosis) 07/06/2014   Depression    Diabetes mellitus without complication (HCC)    borderline   Dysmenorrhea 03/31/2014   Hypertension    Impacted tooth 09/2013   impacted wisdom teeth   Irregular periods 09/03/2014   Leg pain, right    unknown cause, is being evaluated   Obesity    UTI (lower urinary tract infection) 09/03/2014   Vaginal discharge 07/06/2014   Yeast infection 09/21/2014    Past Surgical History:  Procedure Laterality Date   ESOPHAGOGASTRODUODENOSCOPY N/A 06/23/2015   RMR: Retained gastric contents. Not all the stomach seen, however, upper GI tract that was seen appeared normal. Patient reports las intake of solid food 14 hours prior to this procedure. Therefore, Patient likely has an element of delayed gasric emptying. this could be mediction effect and possibly a result underlying diabetic gastroparesis. Intoleratnt to multiple PPI's. Most recently pantoprozo   NO PAST SURGERIES     TOOTH EXTRACTION N/A 10/06/2013   Procedure: EXTRACTION THIRD MOLARS;  Surgeon: Cornelia Dieter, DDS;  Location: [facility];  Service: Oral Surgery;  Laterality: N/A;    Social History   Socioeconomic History   Marital status: Single    Spouse name: Not on file   Number of children: Not on file   Years of education: Not on file   Highest education level: Not on file  Occupational History   Not on file  Tobacco Use   Smoking status: Every Day    Current packs/day: 0.25    Average packs/day: 0.3 packs/day for 15.0  years (3.8 ttl pk-yrs)    Types: Cigarettes   Smokeless tobacco: Never   Tobacco comments:    smokes 5 cig daily  Vaping Use   Vaping status: Never Used  Substance and Sexual Activity   Alcohol use: No    Alcohol/week: 0.0 standard drinks of alcohol   Drug use: No   Sexual activity: Not Currently    Birth control/protection: Abstinence, Pill  Other Topics Concern   Not on file  Social History Narrative   Not on file   Social Drivers of Health   Financial Resource Strain: Low Risk  (02/05/2023)   Overall Financial Resource Strain (CARDIA)    Difficulty of Paying Living Expenses: Not hard at all  Food Insecurity: No Food Insecurity (02/05/2023)   Hunger Vital Sign    Worried About Running Out of Food in the Last Year: Never true    Ran Out of Food in the Last Year: Never true  Transportation Needs: No Transportation Needs (02/05/2023)   PRAPARE - Administrator, Civil Service (Medical): No    Lack of Transportation (Non-Medical): No  Physical Activity: Insufficiently Active (02/05/2023)   Exercise  Vital Sign    Days of Exercise per Week: 2 days    Minutes of Exercise per Session: 20 min  Stress: No Stress Concern Present (02/05/2023)   [employer] of Occupational Health - Occupational Stress Questionnaire    Feeling of Stress : Not at all  Social Connections: Socially Isolated (02/05/2023)   Social Connection and Isolation Panel    Frequency of Communication with Friends and Family: Never    Frequency of Social Gatherings with Friends and Family: Once a week    Attends Religious Services: More than 4 times per year    Active Member of [employer] or Organizations: No    Attends Engineer, structural: Never    Marital Status: Never married    Family History  Problem Relation Age of Onset   Diabetes Mother    Hypertension Mother    Diabetes Father    Preterm labor Son        preterm delivery at 35 weeks   Kidney disease Maternal Grandmother        was  on dialysis   Heart disease Maternal Grandmother    Heart attack Maternal Grandfather    Heart attack Paternal Grandfather    Cancer Maternal Aunt    Cancer Paternal Aunt    Colon cancer Neg Hx     Outpatient Encounter Medications as of 01/08/2024  Medication Sig   amLODipine  (NORVASC ) 10 MG tablet Take 1 tablet (10 mg total) by mouth daily.   chlorthalidone  (HYGROTON ) 25 MG tablet Take 1 tablet (25 mg total) by mouth daily.   clobetasol  ointment (TEMOVATE ) 0.05 % Use bid to external affected tissue for 4 weeks then go to 2-3 x weekly   diclofenac  (VOLTAREN ) 75 MG EC tablet Take 1 every 8 hours prn cramping   lisinopril  (ZESTRIL ) 20 MG tablet Take 1 tablet (20 mg total) by mouth daily.   metFORMIN  (GLUCOPHAGE ) 500 MG tablet Take 1 tablet (500 mg total) by mouth 2 (two) times daily with a meal.   norethindrone  (MICRONOR ) 0.35 MG tablet Take 1 tablet (0.35 mg total) by mouth daily.   omeprazole  (PRILOSEC) 20 MG capsule Take 20 mg by mouth.   promethazine  (PHENERGAN ) 25 MG tablet TAKE 1 TABLET BY MOUTH EVERY 6 HOURS AS NEEDED FOR NAUSEA OR VOMITING   Facility-Administered Encounter Medications as of 01/08/2024  Medication   aspirin  EC tablet 325 mg    ALLERGIES: No Known Allergies  VACCINATION STATUS: Immunization History  Administered Date(s) Administered   Hpv-Unspecified 05/29/2008   Td 05/29/2008   Tdap 09/05/2012    Diabetes She presents for her initial diabetic visit. She has type 2 diabetes mellitus. Her disease course has been improving. There are no hypoglycemic associated symptoms. Associated symptoms include fatigue, polydipsia and polyuria. There are no hypoglycemic complications. There are no diabetic complications. Risk factors for coronary artery disease include diabetes mellitus, dyslipidemia, tobacco exposure, sedentary lifestyle, hypertension, obesity and family history. Current diabetic treatment includes oral agent (monotherapy). She is compliant with treatment  most of the time. Her weight is fluctuating minimally. She is following a generally unhealthy diet. When asked about meal planning, she reported none. She has not had a previous visit with a dietitian. She participates in exercise intermittently. An ACE inhibitor/angiotensin II receptor blocker is being taken. She does not see a podiatrist.Eye exam is current.     Review of systems  Constitutional: + Minimally fluctuating body weight, current There is no height or weight on file to calculate BMI.,  no fatigue, no subjective hyperthermia, no subjective hypothermia Eyes: no blurry vision, no xerophthalmia ENT: no sore throat, no nodules palpated in throat, no dysphagia/odynophagia, no hoarseness Cardiovascular: no chest pain, no shortness of breath, no palpitations, no leg swelling Respiratory: no cough, no shortness of breath Gastrointestinal: no nausea/vomiting/diarrhea Musculoskeletal: no muscle/joint aches Skin: no rashes, no hyperemia Neurological: no tremors, no numbness, no tingling, no dizziness Psychiatric: no depression, no anxiety  Objective:     There were no vitals taken for this visit.  Wt Readings from Last 3 Encounters:  11/21/23 195 lb (88.5 kg)  10/23/23 195 lb (88.5 kg)  09/24/23 196 lb (88.9 kg)     BP Readings from Last 3 Encounters:  11/21/23 (!) 142/90  10/23/23 (!) 160/110  09/24/23 (!) 166/114     Physical Exam- Limited  Constitutional:  There is no height or weight on file to calculate BMI. , not in acute distress, normal state of mind Eyes:  EOMI, no exophthalmos Neck: Supple Cardiovascular: RRR, no murmurs, rubs, or gallops, no edema Respiratory: Adequate breathing efforts, no crackles, rales, rhonchi, or wheezing Musculoskeletal: no gross deformities, strength intact in all four extremities, no gross restriction of joint movements Skin:  no rashes, no hyperemia Neurological: no tremor with outstretched hands   Diabetic Foot Exam - Simple   No  data filed      CMP ( most recent) CMP     Component Value Date/Time   NA 139 09/24/2023 1443   K 4.0 09/24/2023 1443   CL 101 09/24/2023 1443   CO2 24 09/24/2023 1443   GLUCOSE 212 (H) 09/24/2023 1443   GLUCOSE 417 (H) 03/07/2021 1706   BUN 4 (L) 09/24/2023 1443   CREATININE 0.69 09/24/2023 1443   CALCIUM  9.6 09/24/2023 1443   PROT 7.1 09/24/2023 1443   ALBUMIN 4.1 09/24/2023 1443   AST 13 09/24/2023 1443   ALT 11 09/24/2023 1443   ALKPHOS 124 (H) 09/24/2023 1443   BILITOT 0.6 09/24/2023 1443   EGFR 116 09/24/2023 1443   GFRNONAA >60 03/07/2021 1706     Diabetic Labs (most recent): Lab Results  Component Value Date   HGBA1C 10.8 (H) 09/24/2023   HGBA1C 14.5 (H) 02/05/2023   HGBA1C 12.6 (H) 02/14/2021   MICROALBUR 4.0 (H) 02/14/2021     Lipid Panel ( most recent) Lipid Panel     Component Value Date/Time   CHOL 141 02/05/2023 1516   TRIG 112 02/05/2023 1516   HDL 34 (L) 02/05/2023 1516   CHOLHDL 4.1 02/05/2023 1516   CHOLHDL 4.7 02/14/2021 1107   VLDL 17 02/14/2021 1107   LDLCALC 86 02/05/2023 1516   LABVLDL 21 02/05/2023 1516      Lab Results  Component Value Date   TSH 1.580 02/05/2023   TSH 1.501 11/02/2009   FREET4 1.48 02/05/2023           Assessment & Plan:   1) Type 2 diabetes mellitus with hyperglycemia, without long-term current use of insulin (HCC) (Primary)    - Mariem S Freiberger has currently uncontrolled symptomatic type 2 DM since *** years of age, with most recent A1c of *** %.   -Recent labs reviewed.  - I had a long discussion with her about the progressive nature of diabetes and the pathology behind its complications. -her diabetes is not currently complicated but she remains at a high risk for more acute and chronic complications which include CAD, CVA, CKD, retinopathy, and neuropathy. These are all discussed in detail  with her.  The following Lifestyle Medicine recommendations according to American College of Lifestyle  Medicine [facility]) were discussed and offered to patient and she agrees to start the journey:  A. Whole Foods, Plant-based plate comprising of fruits and vegetables, plant-based proteins, whole-grain carbohydrates was discussed in detail with the patient.   A list for source of those nutrients were also provided to the patient.  Patient will use only water  or unsweetened tea for hydration. B.  The need to stay away from risky substances including alcohol, smoking; obtaining 7 to 9 hours of restorative sleep, at least 150 minutes of moderate intensity exercise weekly, the importance of healthy social connections,  and stress reduction techniques were discussed. C.  A full color page of  Calorie density of various food groups per pound showing examples of each food groups was provided to the patient.  - I have counseled her on diet and weight management by adopting a carbohydrate restricted/protein rich diet. Patient is encouraged to switch to unprocessed or minimally processed complex starch and increased protein intake (animal or plant source), fruits, and vegetables. -  she is advised to stick to a routine mealtimes to eat 3 meals a day and avoid unnecessary snacks (to snack only to correct hypoglycemia).   - she acknowledges that there is a room for improvement in her food and drink choices. - Suggestion is made for her to avoid simple carbohydrates from her diet including Cakes, Sweet Desserts, Ice Cream, Soda (diet and regular), Sweet Tea, Candies, Chips, Cookies, Store Bought Juices, Alcohol in Excess of 1-2 drinks a day, Artificial Sweeteners, Coffee Creamer, and Sugar-free Products. This will help patient to have more stable blood glucose profile and potentially avoid unintended weight gain.  - she will be scheduled with Melva Stabile, RDN, CDE for diabetes education.  - I have approached her with the following individualized plan to manage her diabetes and patient agrees:    -she is  encouraged to start/continue monitoring glucose 2 times daily, before breakfast and before bed, and to call the clinic if she has readings less than 70 or above 300 for 3 tests in a row.  - she is warned not to take insulin without proper monitoring per orders. - Adjustment parameters are given to her for hypo and hyperglycemia in writing.  - she is advised to continue ***, therapeutically suitable for patient .  - she is not an ideal candidate for incretin therapy due to heavy smoking history, which increases her risk of pancreatitis.  - Specific targets for  A1c; LDL, HDL, and Triglycerides were discussed with the patient.  2) Blood Pressure /Hypertension:  her blood pressure is controlled to target.   she is advised to continue her current medications as prescribed by PCP.  3) Lipids/Hyperlipidemia:    Review of her recent lipid panel from 02/05/23 showed controlled LDL at 84.  She is not currently on any lipid lowering medications.    4)  Weight/Diet:  her There is no height or weight on file to calculate BMI.  -  clearly complicating her diabetes care.   she is a candidate for weight loss. I discussed with her the fact that loss of 5 - 10% of her  current body weight will have the most impact on her diabetes management.  Exercise, and detailed carbohydrates information provided  -  detailed on discharge instructions.  5) Chronic Care/Health Maintenance: -she is on ACEI/ARB and not on Statin medications and is encouraged to initiate and  continue to follow up with Ophthalmology, Dentist, Podiatrist at least yearly or according to recommendations, and advised to QUIT SMOKING. I have recommended yearly flu vaccine and pneumonia vaccine at least every 5 years; moderate intensity exercise for up to 150 minutes weekly; and sleep for at least 7 hours a day.  - she is advised to maintain close follow up with Fanta, Tesfaye Demissie, MD for primary care needs, as well as her other providers for  optimal and coordinated care.   - Time spent in this patient care: 60 min, which was spent in counseling her about her diabetes and the rest reviewing her blood glucose logs, discussing her hypoglycemia and hyperglycemia episodes, reviewing her current and previous labs/studies (including abstraction from other facilities) and medications doses and developing a long term treatment plan based on the latest standards of care/guidelines; and documenting her care.    Please refer to Patient Instructions for Blood Glucose Monitoring and Insulin/Medications Dosing Guide in media tab for additional information. Please also refer to Patient Self Inventory in the Media tab for reviewed elements of pertinent patient history.  Pamela Ford participated in the discussions, expressed understanding, and voiced agreement with the above plans.  All questions were answered to her satisfaction. she is encouraged to contact clinic should she have any questions or concerns prior to her return visit.     Follow up plan: - No follow-ups on file.    Hulon Magic, [facility] [address] [place], [place] [address] Phone: [phone] Fax: [phone]  01/08/2024, 7:00 AM

## 2024-01-21 ENCOUNTER — Ambulatory Visit: Admitting: Adult Health

## 2024-01-21 ENCOUNTER — Encounter: Payer: Self-pay | Admitting: Adult Health

## 2024-01-21 VITALS — BP 164/96 | HR 94 | Ht 63.0 in | Wt 200.0 lb

## 2024-01-21 DIAGNOSIS — Z7689 Persons encountering health services in other specified circumstances: Secondary | ICD-10-CM

## 2024-01-21 DIAGNOSIS — E119 Type 2 diabetes mellitus without complications: Secondary | ICD-10-CM | POA: Diagnosis not present

## 2024-01-21 DIAGNOSIS — I10 Essential (primary) hypertension: Secondary | ICD-10-CM

## 2024-01-21 MED ORDER — NORETHINDRONE 0.35 MG PO TABS: 1.0000 | ORAL_TABLET | Freq: Every day | ORAL | 11 refills | Status: AC

## 2024-01-21 NOTE — Progress Notes (Signed)
  Subjective:     Patient ID: Pamela Ford, female   DOB: 12-10-1985, 38 y.o.   MRN: [id number]  HPI Pamela Ford is a 38 year old black female,single,[id number], in to get Micronor  refilled. She has some spotting with it but not bad. She missed appt with Dr Lenis had family emergency.     Component Value Date/Time   DIAGPAP  02/05/2023 1438    - Negative for intraepithelial lesion or malignancy (NILM)   DIAGPAP  01/20/2019 0000    NEGATIVE FOR INTRAEPITHELIAL LESIONS OR MALIGNANCY.   HPVHIGH Negative 02/05/2023 1438   ADEQPAP  02/05/2023 1438    Satisfactory for evaluation; transformation zone component PRESENT.   ADEQPAP  01/20/2019 0000    Satisfactory for evaluation  endocervical/transformation zone component PRESENT.   PCP is Dr Carlette  Review of Systems Spotting with micronor ,not bad Reviewed past medical,surgical, social and family history. Reviewed medications and allergies.     Objective:   Physical Exam BP (!) 164/96 (BP Location: Right Arm, Patient Position: Sitting, Cuff Size: Large)   Pulse 94   Ht 5' 3 (1.6 m)   Wt 200 lb (90.7 kg)   BMI 35.43 kg/m     Skin warm and dry. . Lungs: clear to ausculation bilaterally. Cardiovascular: regular rate and rhythm. Per Vernell Ruddle CNM student   Upstream - 01/21/24 1521       Pregnancy Intention Screening   Does the patient want to become pregnant in the next year? No    Does the patient's partner want to become pregnant in the next year? No    Would the patient like to discuss contraceptive options today? No      Contraception Wrap Up   Current Method Oral Contraceptive    End Method Oral Contraceptive    Contraception Counseling Provided Yes          Assessment:     1. Encounter for menstrual regulation (Primary) Has some spotting, not bad, will refill micronor  Meds ordered this encounter  Medications   norethindrone  (MICRONOR ) 0.35 MG tablet    Sig: Take 1 tablet (0.35 mg total) by mouth daily.    Dispense:   28 tablet    Refill:  11    Supervising Provider:   JAYNE MINDER H [[id number]]     2. Hypertension, unspecified type Take BP meds, on norvasc  10 mg 1 daily,hygroton  25 mg 1 daily and lisinopril  20 mg 1 daily Follow up with PCP too Will check CMP - Comprehensive metabolic panel with GFR  3. Type 2 diabetes mellitus without complication, without long-term current use of insulin (HCC) On metformin   Missed appt with Dr Lenis Will check A1c  - Hemoglobin A1c     Plan:     Has appt 7/16 for physical

## 2024-01-23 ENCOUNTER — Ambulatory Visit: Payer: Self-pay | Admitting: Adult Health

## 2024-01-24 LAB — HEMOGLOBIN A1C
Est. average glucose Bld gHb Est-mCnc: 166 mg/dL
Hgb A1c MFr Bld: 7.4 % — ABNORMAL HIGH (ref 4.8–5.6)

## 2024-01-24 LAB — COMPREHENSIVE METABOLIC PANEL WITH GFR
ALT: 10 IU/L (ref 0–32)
AST: 13 IU/L (ref 0–40)
Albumin: 4.2 g/dL (ref 3.9–4.9)
Alkaline Phosphatase: 112 IU/L (ref 44–121)
BUN/Creatinine Ratio: 3 — ABNORMAL LOW (ref 9–23)
BUN: 2 mg/dL — ABNORMAL LOW (ref 6–20)
Bilirubin Total: 0.5 mg/dL (ref 0.0–1.2)
CO2: 19 mmol/L — ABNORMAL LOW (ref 20–29)
Calcium: 9.4 mg/dL (ref 8.7–10.2)
Chloride: 103 mmol/L (ref 96–106)
Creatinine, Ser: 0.79 mg/dL (ref 0.57–1.00)
Globulin, Total: 2.8 g/dL (ref 1.5–4.5)
Glucose: 119 mg/dL — ABNORMAL HIGH (ref 70–99)
Potassium: 4 mmol/L (ref 3.5–5.2)
Sodium: 141 mmol/L (ref 134–144)
Total Protein: 7 g/dL (ref 6.0–8.5)
eGFR: 99 mL/min/{1.73_m2} (ref >59)

## 2024-02-06 ENCOUNTER — Ambulatory Visit: Admitting: Adult Health

## 2024-04-03 ENCOUNTER — Ambulatory Visit: Admitting: Adult Health
